# Patient Record
Sex: Male | Born: 1955
Health system: Southern US, Community
[De-identification: ages and names within clinical notes are randomized; demographics above are authoritative.]

## PROBLEM LIST (undated history)

## (undated) DIAGNOSIS — F17200 Nicotine dependence, unspecified, uncomplicated: Secondary | ICD-10-CM

## (undated) DIAGNOSIS — E785 Hyperlipidemia, unspecified: Secondary | ICD-10-CM

## (undated) DIAGNOSIS — IMO0001 Reserved for inherently not codable concepts without codable children: Secondary | ICD-10-CM

## (undated) DIAGNOSIS — Z720 Tobacco use: Secondary | ICD-10-CM

## (undated) DIAGNOSIS — I739 Peripheral vascular disease, unspecified: Secondary | ICD-10-CM

## (undated) DIAGNOSIS — I10 Essential (primary) hypertension: Secondary | ICD-10-CM

## (undated) DIAGNOSIS — K219 Gastro-esophageal reflux disease without esophagitis: Secondary | ICD-10-CM

## (undated) DIAGNOSIS — I779 Disorder of arteries and arterioles, unspecified: Secondary | ICD-10-CM

## (undated) DIAGNOSIS — M1990 Unspecified osteoarthritis, unspecified site: Secondary | ICD-10-CM

## (undated) DIAGNOSIS — Z95810 Presence of automatic (implantable) cardiac defibrillator: Secondary | ICD-10-CM

## (undated) DIAGNOSIS — I5020 Unspecified systolic (congestive) heart failure: Secondary | ICD-10-CM

## (undated) DIAGNOSIS — I219 Acute myocardial infarction, unspecified: Secondary | ICD-10-CM

## (undated) DIAGNOSIS — R0600 Dyspnea, unspecified: Secondary | ICD-10-CM

## (undated) DIAGNOSIS — I469 Cardiac arrest, cause unspecified: Secondary | ICD-10-CM

## (undated) HISTORY — PX: COLON SURGERY: SHX602

## (undated) HISTORY — DX: Tobacco use: Z72.0

## (undated) HISTORY — PX: EXPLORATORY LAPAROTOMY: SUR591

## (undated) HISTORY — PX: ABDOMINAL SURGERY: SHX537

## (undated) HISTORY — DX: Peripheral vascular disease, unspecified: I73.9

## (undated) HISTORY — DX: Essential (primary) hypertension: I10

## (undated) HISTORY — PX: TONSILLECTOMY: SUR1361

## (undated) HISTORY — PX: APPENDECTOMY: SHX54

---
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
## 2009-11-07 ENCOUNTER — Encounter: Admission: RE | Admit: 2009-11-07 | Discharge: 2009-11-07 | Payer: Self-pay | Admitting: Occupational Medicine

## 2015-07-18 ENCOUNTER — Telehealth: Payer: Self-pay | Admitting: Cardiovascular Disease

## 2015-07-18 NOTE — Telephone Encounter (Deleted)
Received records from [facility] [place] for appointment with Dr Allyson Sabal on 11/

## 2015-07-18 NOTE — Telephone Encounter (Signed)
Received records from [facility] [place] for appointment on 08/02/15 with Dr Allyson Sabal.  Records given to [facility] (medical records) for Dr Hazle Coca schedule on 08/02/15. lp

## 2015-07-30 ENCOUNTER — Encounter: Payer: Self-pay | Admitting: Cardiovascular Disease

## 2015-07-30 ENCOUNTER — Ambulatory Visit (INDEPENDENT_AMBULATORY_CARE_PROVIDER_SITE_OTHER): Payer: Managed Care, Other (non HMO) | Admitting: Cardiovascular Disease

## 2015-07-30 VITALS — BP 130/80 | HR 73 | Ht 71.0 in | Wt 184.0 lb

## 2015-07-30 DIAGNOSIS — I10 Essential (primary) hypertension: Secondary | ICD-10-CM | POA: Insufficient documentation

## 2015-07-30 DIAGNOSIS — I739 Peripheral vascular disease, unspecified: Secondary | ICD-10-CM | POA: Diagnosis not present

## 2015-07-30 DIAGNOSIS — Z720 Tobacco use: Secondary | ICD-10-CM | POA: Diagnosis not present

## 2015-07-30 NOTE — Assessment & Plan Note (Signed)
40 pack years of tobacco abuse currently smoking one pack per day

## 2015-07-30 NOTE — Assessment & Plan Note (Signed)
History of hypertension blood pressure measured at 130/80. He is on benazepril. Continue current meds at current dose

## 2015-07-30 NOTE — Assessment & Plan Note (Signed)
Rick Logan was referred by Dr. Doristine Counter for symptomatic left calf claudication which began 3 months ago. His risk factors include tobacco abuse and hypertension. He has absent left pedal pulse with a 2+ left common femoral pulse. I suspect he has an occluded left SFA. We will proceed with lower extremity arterial Doppler studies and subsequent angiography depending on the results. I thoroughly discussed the procedure with the patient and he agrees to proceed

## 2015-07-30 NOTE — Progress Notes (Signed)
07/30/2015 Rick Logan   Aug 30, 1956  [id number]  Primary Physician Delorse Lek, MD Primary Cardiologist: Runell Gess MD [name]   HPI:  Rick Logan is a 59 year old married Caucasian male father of 3 children, grandfather and 5 grandchildren referred by Dr. Doristine Counter for peripheral vascular evaluation because of symptomatic claudication. 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 His cardiovascular risk factor profile is notable for 40-pack-years of tobacco abuse currently smoking one pack per day. There is no family history of heart disease. He has never had a heart attack or stroke. He denies chest pain or shortness of breath. He's noticed claudication for the last 3 months in his left calf particularly notable when walking up an incline. He was noted by his primary care physician to have an absent left pedal pulse and was referred here for further evaluation.   Current Outpatient Prescriptions  Medication Sig Dispense Refill  . BENAZEPRIL HCL PO Take by mouth daily.     No current facility-administered medications for this visit.    No Known Allergies  Social History   Social History  . Marital Status: Unknown    Spouse Name: N/A  . Number of Children: N/A  . Years of Education: N/A   Occupational History  . Not on file.   Social History Main Topics  . Smoking status: Current Every Day Smoker  . Smokeless tobacco: Never Used  . Alcohol Use: Yes  . Drug Use: No  . Sexual Activity: Not on file   Other Topics Concern  . Not on file   Social History Narrative  . No narrative on file     Review of Systems: General: negative for chills, fever, night sweats or weight changes.  Cardiovascular: negative for chest pain, dyspnea on exertion, edema, orthopnea, palpitations, paroxysmal nocturnal dyspnea or shortness of breath Dermatological: negative for rash Respiratory: negative for cough or wheezing Urologic:  negative for hematuria Abdominal: negative for nausea, vomiting, diarrhea, bright red blood per rectum, melena, or hematemesis Neurologic: negative for visual changes, syncope, or dizziness All other systems reviewed and are otherwise negative except as noted above.    Blood pressure 130/80, pulse 73, height 5\' 11"  (1.803 m), weight 184 lb (83.462 kg).  General appearance: alert and no distress Neck: no adenopathy, no carotid bruit, no JVD, supple, symmetrical, trachea midline and thyroid not enlarged, symmetric, no tenderness/mass/nodules Lungs: clear to auscultation bilaterally Heart: regular rate and rhythm, S1, S2 normal, no murmur, click, rub or gallop Extremities: absent left pedal pulse  EKG normal sinus rhythm at 73 with left anterior fascicular block and reverse R-wave progression with nonspecific ST and T-wave changes. I personally reviewed this EKG  ASSESSMENT AND PLAN:   Tobacco abuse 40 pack years of tobacco abuse currently smoking one pack per day  Essential hypertension History of hypertension blood pressure measured at 130/80. He is on benazepril. Continue current meds at current dose  Claudication [facility]) Mr. Babayev was referred by Dr. Doristine Counter for symptomatic left calf claudication which began 3 months ago. His risk factors include tobacco abuse and hypertension. He has absent left pedal pulse with a 2+ left common femoral pulse. I suspect he has an occluded left SFA. We will proceed with lower extremity arterial Doppler studies and subsequent angiography depending on the results. I thoroughly discussed the procedure with the patient and he agrees to proceed      Runell Gess MD [facility] 07/30/2015 3:48 PM

## 2015-07-30 NOTE — Patient Instructions (Signed)
Medication Instructions:  Your physician recommends that you continue on your current medications as directed. Please refer to the Current Medication list given to you today.   Labwork: none  Testing/Procedures: Your physician has requested that you have a lower extremity arterial doppler- During this test, ultrasound is used to evaluate arterial blood flow in the legs. Allow approximately one hour for this exam.    Follow-Up: Follow up with Dr. Allyson Sabal Pending results of you lower extremity arterial dopplers.  Any Other Special Instructions Will Be Listed Below (If Applicable).     If you need a refill on your cardiac medications before your next appointment, please call your pharmacy.

## 2015-08-07 ENCOUNTER — Ambulatory Visit (HOSPITAL_COMMUNITY)
Admission: RE | Admit: 2015-08-07 | Discharge: 2015-08-07 | Disposition: A | Discharge status: Home / Self Care | Payer: Managed Care, Other (non HMO) | Source: Ambulatory Visit | Attending: Cardiovascular Disease | Admitting: Cardiovascular Disease

## 2015-08-07 ENCOUNTER — Other Ambulatory Visit: Payer: Self-pay | Admitting: Cardiovascular Disease

## 2015-08-07 DIAGNOSIS — I10 Essential (primary) hypertension: Secondary | ICD-10-CM

## 2015-08-07 DIAGNOSIS — Z720 Tobacco use: Secondary | ICD-10-CM

## 2015-08-07 DIAGNOSIS — I739 Peripheral vascular disease, unspecified: Secondary | ICD-10-CM

## 2015-08-11 ENCOUNTER — Telehealth: Payer: Self-pay | Admitting: Cardiovascular Disease

## 2015-08-11 NOTE — Telephone Encounter (Signed)
Returned call to patient.Stated he was calling to get results of LE dopplers done 08/07/15.Dr.Berry advised needs appointment with him to discuss results.Appointment scheduled with Dr.Berry 08/19/15 at 9:15 am.Stated he would like sooner appointment if possible.Advised I will send message to Dr.Berry's nurse Selena Batten for a sooner appointment.

## 2015-08-11 NOTE — Telephone Encounter (Signed)
Pt would like his ultrasound results from 08-07-15 please.

## 2015-08-12 ENCOUNTER — Encounter: Payer: Self-pay | Admitting: Cardiovascular Disease

## 2015-08-12 NOTE — Telephone Encounter (Signed)
Pt made aware he has the earliest appointment scheduled on 11/22 @9 :15pm.

## 2015-08-19 ENCOUNTER — Encounter: Payer: Self-pay | Admitting: Cardiovascular Disease

## 2015-08-19 ENCOUNTER — Ambulatory Visit (INDEPENDENT_AMBULATORY_CARE_PROVIDER_SITE_OTHER): Payer: Managed Care, Other (non HMO) | Admitting: Cardiovascular Disease

## 2015-08-19 VITALS — BP 126/80 | HR 71 | Ht 71.0 in | Wt 184.0 lb

## 2015-08-19 DIAGNOSIS — Z01818 Encounter for other preprocedural examination: Secondary | ICD-10-CM | POA: Diagnosis not present

## 2015-08-19 DIAGNOSIS — I739 Peripheral vascular disease, unspecified: Secondary | ICD-10-CM

## 2015-08-19 LAB — CBC WITH DIFFERENTIAL/PLATELET
Basophils Absolute: 0 10*3/uL (ref 0.0–0.1)
Basophils Relative: 0 % (ref 0–1)
EOS ABS: 0.3 10*3/uL (ref 0.0–0.7)
Eosinophils Relative: 3 % (ref 0–5)
HCT: 53.5 % — ABNORMAL HIGH (ref 39.0–52.0)
Hemoglobin: 18.6 g/dL — ABNORMAL HIGH (ref 13.0–17.0)
LYMPHS ABS: 2.3 10*3/uL (ref 0.7–4.0)
Lymphocytes Relative: 24 % (ref 12–46)
MCH: 33.2 pg (ref 26.0–34.0)
MCHC: 34.8 g/dL (ref 30.0–36.0)
MCV: 95.4 fL (ref 78.0–100.0)
MONOS PCT: 10 % (ref 3–12)
MPV: 10.1 fL (ref 8.6–12.4)
Monocytes Absolute: 1 10*3/uL (ref 0.1–1.0)
NEUTROS PCT: 63 % (ref 43–77)
Neutro Abs: 6.1 10*3/uL (ref 1.7–7.7)
PLATELETS: 269 10*3/uL (ref 150–400)
RBC: 5.61 MIL/uL (ref 4.22–5.81)
RDW: 13.4 % (ref 11.5–15.5)
WBC: 9.7 10*3/uL (ref 4.0–10.5)

## 2015-08-19 LAB — TSH: TSH: 1.379 u[IU]/mL (ref 0.350–4.500)

## 2015-08-19 LAB — BASIC METABOLIC PANEL
BUN: 10 mg/dL (ref 7–25)
CHLORIDE: 101 mmol/L (ref 98–110)
CO2: 27 mmol/L (ref 20–31)
CREATININE: 0.85 mg/dL (ref 0.70–1.25)
Calcium: 9.5 mg/dL (ref 8.6–10.3)
GLUCOSE: 93 mg/dL (ref 65–99)
Potassium: 4.7 mmol/L (ref 3.5–5.3)
Sodium: 139 mmol/L (ref 135–146)

## 2015-08-19 LAB — APTT: aPTT: 31 seconds (ref 24–37)

## 2015-08-19 LAB — PROTIME-INR
INR: 1.04 (ref <1.50)
Prothrombin Time: 13.7 seconds (ref 11.6–15.2)

## 2015-08-19 NOTE — Patient Instructions (Signed)
Medication Instructions:  Your physician recommends that you continue on your current medications as directed. Please refer to the Current Medication list given to you today.   Labwork: Your physician recommends that you return for lab work in: TODAY The lab can be found on the FIRST FLOOR of out building in [address]   Testing/Procedures: Dr. Allyson Sabal has ordered a peripheral angiogram to be done at [facility].  This procedure is going to look at the bloodflow in your lower extremities.  If Dr. Allyson Sabal is able to open up the arteries, you will have to spend one night in the hospital.  If he is not able to open the arteries, you will be able to go home that same day.  SCHEDULE FOR 12/1  After the procedure, you will not be allowed to drive for 3 days or push, pull, or lift anything greater than 10 lbs for one week.    You will be required to have the following tests prior to the procedure:  1. Blood work-the blood work can be done no more than 7 days prior to the procedure.  It can be done at any [facility] lab.  There is one downstairs on the first floor of this building and one in the [facility] building [phone] [address])  2. Chest Xray-the chest xray order has already been placed at the [facility] Building.     *REPS: Scott  Puncture site : Right Groin      Any Other Special Instructions Will Be Listed Below (If Applicable).     If you need a refill on your cardiac medications before your next appointment, please call your pharmacy.

## 2015-08-19 NOTE — Assessment & Plan Note (Signed)
Mr. Friedley returns to follow-up of his lower extremity arterial Doppler studies 08/07/15. His right ABI was normal and his left ABI was 0.57 with a high-frequency signal at the origin of his left SFA. He does have lifestyle limiting claudication. We will plan to perform angiography and endovascular therapy next week.

## 2015-08-19 NOTE — Progress Notes (Signed)
Mr. Tillerson returns for follow-up of his Doppler studies. This revealed a right ABI that was normal and a left ABI that was significantly diminished at 0.57. He had a hypertensive emergency signal at the origin of his left SFA. We'll plan on performing angiography and potential for continued intervention next week. I thoroughly discussed the pre-procedure with the patient.

## 2015-08-20 ENCOUNTER — Telehealth: Payer: Self-pay | Admitting: Cardiovascular Disease

## 2015-08-20 NOTE — Telephone Encounter (Signed)
Received Attending Physician Statement from [employer] via fax for Dr Allyson Sabal to complete and sign.  Sent forms to [employer] @ [facility] @ [facility] for letter/pkt to be sent to patient for AUTH/PMT.  Sent to [employer] via Courier on 08/20/15. lp

## 2015-08-25 ENCOUNTER — Ambulatory Visit
Admission: RE | Admit: 2015-08-25 | Discharge: 2015-08-25 | Disposition: A | Discharge status: Home / Self Care | Payer: Managed Care, Other (non HMO) | Source: Ambulatory Visit | Attending: Cardiovascular Disease | Admitting: Cardiovascular Disease

## 2015-08-25 DIAGNOSIS — I739 Peripheral vascular disease, unspecified: Secondary | ICD-10-CM

## 2015-08-25 DIAGNOSIS — Z01818 Encounter for other preprocedural examination: Secondary | ICD-10-CM

## 2015-08-26 ENCOUNTER — Ambulatory Visit: Payer: Managed Care, Other (non HMO) | Admitting: Cardiovascular Disease

## 2015-08-28 ENCOUNTER — Ambulatory Visit (HOSPITAL_COMMUNITY)
Admission: RE | Admit: 2015-08-28 | Discharge: 2015-08-29 | Disposition: A | Discharge status: Home / Self Care | Payer: Managed Care, Other (non HMO) | Source: Ambulatory Visit | Attending: Cardiovascular Disease | Admitting: Cardiovascular Disease

## 2015-08-28 ENCOUNTER — Encounter (HOSPITAL_COMMUNITY): Payer: Self-pay | Admitting: Cardiovascular Disease

## 2015-08-28 ENCOUNTER — Encounter (HOSPITAL_COMMUNITY)
Admission: RE | Disposition: A | Discharge status: Home / Self Care | Payer: Self-pay | Source: Ambulatory Visit | Attending: Cardiovascular Disease

## 2015-08-28 DIAGNOSIS — Z7982 Long term (current) use of aspirin: Secondary | ICD-10-CM | POA: Diagnosis not present

## 2015-08-28 DIAGNOSIS — F17210 Nicotine dependence, cigarettes, uncomplicated: Secondary | ICD-10-CM | POA: Insufficient documentation

## 2015-08-28 DIAGNOSIS — I70212 Atherosclerosis of native arteries of extremities with intermittent claudication, left leg: Secondary | ICD-10-CM | POA: Insufficient documentation

## 2015-08-28 DIAGNOSIS — Z7902 Long term (current) use of antithrombotics/antiplatelets: Secondary | ICD-10-CM | POA: Insufficient documentation

## 2015-08-28 DIAGNOSIS — I739 Peripheral vascular disease, unspecified: Secondary | ICD-10-CM | POA: Diagnosis present

## 2015-08-28 DIAGNOSIS — Z79899 Other long term (current) drug therapy: Secondary | ICD-10-CM | POA: Insufficient documentation

## 2015-08-28 HISTORY — DX: Unspecified osteoarthritis, unspecified site: M19.90

## 2015-08-28 HISTORY — DX: Peripheral vascular disease, unspecified: I73.9

## 2015-08-28 HISTORY — PX: PERIPHERAL VASCULAR CATHETERIZATION: SHX172C

## 2015-08-28 LAB — POCT ACTIVATED CLOTTING TIME
ACTIVATED CLOTTING TIME: 227 s
ACTIVATED CLOTTING TIME: 233 s
ACTIVATED CLOTTING TIME: 140 s

## 2015-08-28 SURGERY — LOWER EXTREMITY ANGIOGRAPHY
Anesthesia: LOCAL

## 2015-08-28 MED ORDER — SODIUM CHLORIDE 0.9 % WEIGHT BASED INFUSION
3.0000 mL/kg/h | INTRAVENOUS | Status: DC
  Administered 2015-08-28: 3 mL/kg/h via INTRAVENOUS

## 2015-08-28 MED ORDER — ASPIRIN EC 81 MG PO TBEC
81.0000 mg | DELAYED_RELEASE_TABLET | Freq: Every day | ORAL | Status: DC
Start: 2015-08-28 — End: 2015-08-28

## 2015-08-28 MED ORDER — MIDAZOLAM HCL 2 MG/2ML IJ SOLN
INTRAMUSCULAR | Status: AC
  Filled 2015-08-28: qty 2

## 2015-08-28 MED ORDER — SODIUM CHLORIDE 0.9 % IJ SOLN: 3.0000 mL | INTRAMUSCULAR | Status: DC | PRN

## 2015-08-28 MED ORDER — FENTANYL CITRATE (PF) 100 MCG/2ML IJ SOLN
INTRAMUSCULAR | Status: AC
  Filled 2015-08-28: qty 2

## 2015-08-28 MED ORDER — SODIUM CHLORIDE 0.9 % IV SOLN: INTRAVENOUS | Status: AC

## 2015-08-28 MED ORDER — SODIUM CHLORIDE 0.9 % WEIGHT BASED INFUSION: 1.0000 mL/kg/h | INTRAVENOUS | Status: DC

## 2015-08-28 MED ORDER — BENAZEPRIL HCL 20 MG PO TABS
40.0000 mg | ORAL_TABLET | Freq: Every day | ORAL | Status: DC
  Administered 2015-08-29: 11:00:00 40 mg via ORAL
  Filled 2015-08-28: qty 2

## 2015-08-28 MED ORDER — HEPARIN SODIUM (PORCINE) 1000 UNIT/ML IJ SOLN
INTRAMUSCULAR | Status: DC | PRN
  Administered 2015-08-28: 7500 [IU] via INTRAVENOUS
  Administered 2015-08-28: 2500 [IU] via INTRAVENOUS

## 2015-08-28 MED ORDER — HEPARIN SODIUM (PORCINE) 1000 UNIT/ML IJ SOLN
INTRAMUSCULAR | Status: AC
  Filled 2015-08-28: qty 1

## 2015-08-28 MED ORDER — ASPIRIN 81 MG PO CHEW
81.0000 mg | CHEWABLE_TABLET | ORAL | Status: DC
Start: 2015-08-28 — End: 2015-08-28

## 2015-08-28 MED ORDER — SODIUM CHLORIDE 0.9 % IV SOLN: 250.0000 mL | INTRAVENOUS | Status: DC | PRN

## 2015-08-28 MED ORDER — CLOPIDOGREL BISULFATE 75 MG PO TABS
75.0000 mg | ORAL_TABLET | Freq: Every day | ORAL | Status: DC
  Administered 2015-08-29: 08:00:00 75 mg via ORAL
  Filled 2015-08-28: qty 1

## 2015-08-28 MED ORDER — ASPIRIN EC 325 MG PO TBEC
325.0000 mg | DELAYED_RELEASE_TABLET | Freq: Every day | ORAL | Status: DC
  Administered 2015-08-29: 325 mg via ORAL
  Filled 2015-08-28: qty 1

## 2015-08-28 MED ORDER — MORPHINE SULFATE (PF) 2 MG/ML IV SOLN
2.0000 mg | INTRAVENOUS | Status: DC | PRN
Start: 2015-08-28 — End: 2015-08-29

## 2015-08-28 MED ORDER — ONDANSETRON HCL 4 MG/2ML IJ SOLN: 4.0000 mg | Freq: Four times a day (QID) | INTRAMUSCULAR | Status: DC | PRN

## 2015-08-28 MED ORDER — MIDAZOLAM HCL 2 MG/2ML IJ SOLN
INTRAMUSCULAR | Status: DC | PRN
  Administered 2015-08-28: 1 mg via INTRAVENOUS

## 2015-08-28 MED ORDER — LIDOCAINE HCL (PF) 1 % IJ SOLN
INTRAMUSCULAR | Status: AC
  Filled 2015-08-28: qty 30

## 2015-08-28 MED ORDER — ACETAMINOPHEN 325 MG PO TABS: 650.0000 mg | ORAL_TABLET | ORAL | Status: DC | PRN

## 2015-08-28 MED ORDER — ASPIRIN 81 MG PO CHEW
CHEWABLE_TABLET | ORAL | Status: AC
  Filled 2015-08-28: qty 1

## 2015-08-28 MED ORDER — SODIUM CHLORIDE 0.9 % IJ SOLN: 3.0000 mL | Freq: Two times a day (BID) | INTRAMUSCULAR | Status: DC

## 2015-08-28 MED ORDER — HEPARIN (PORCINE) IN NACL 2-0.9 UNIT/ML-% IJ SOLN
INTRAMUSCULAR | Status: AC
  Filled 2015-08-28: qty 1000

## 2015-08-28 MED ORDER — HYDRALAZINE HCL 20 MG/ML IJ SOLN: 10.0000 mg | INTRAMUSCULAR | Status: DC | PRN

## 2015-08-28 MED ORDER — FENTANYL CITRATE (PF) 100 MCG/2ML IJ SOLN
INTRAMUSCULAR | Status: DC | PRN
  Administered 2015-08-28: 25 ug via INTRAVENOUS

## 2015-08-28 MED ORDER — LIDOCAINE HCL (PF) 1 % IJ SOLN
INTRAMUSCULAR | Status: DC | PRN
  Administered 2015-08-28: 20 mL

## 2015-08-28 SURGICAL SUPPLY — 22 items
BALLN LUTONIX 6X150X130 (BALLOONS) ×2
BALLOON LUTONIX 6X150X130 (BALLOONS) IMPLANT
CATH ANGIO 5F BER2 100CM (CATHETERS) ×1 IMPLANT
CATH ANGIO 5F PIGTAIL 65CM (CATHETERS) ×1 IMPLANT
CATH HAWKONE LX EXTENDED TIP (CATHETERS) ×1 IMPLANT
CATH STRAIGHT 5FR 65CM (CATHETERS) ×1 IMPLANT
DEVICE CONTINUOUS FLUSH (MISCELLANEOUS) ×1 IMPLANT
DEVICE SPIDERFX EMB PROT 6MM (WIRE) ×1 IMPLANT
KIT ENCORE 26 ADVANTAGE (KITS) ×1 IMPLANT
KIT PV (KITS) ×2 IMPLANT
SHEATH HIGHFLEX ANSEL 7FR 55CM (SHEATH) ×1 IMPLANT
SHEATH PINNACLE 5F 10CM (SHEATH) ×1 IMPLANT
SHEATH PINNACLE 7F 10CM (SHEATH) ×1 IMPLANT
STOPCOCK MORSE 400PSI 3WAY (MISCELLANEOUS) ×1 IMPLANT
SYRINGE MEDRAD AVANTA MACH 7 (SYRINGE) ×1 IMPLANT
TAPE RADIOPAQUE TURBO (MISCELLANEOUS) ×1 IMPLANT
TRANSDUCER W/STOPCOCK (MISCELLANEOUS) ×2 IMPLANT
TRAY PV CATH (CUSTOM PROCEDURE TRAY) ×2 IMPLANT
TUBING CIL FLEX 10 FLL-RA (TUBING) ×1 IMPLANT
WIRE HITORQ VERSACORE ST 145CM (WIRE) ×1 IMPLANT
WIRE SPARTACORE .014X300CM (WIRE) ×1 IMPLANT
WIRE VERSACORE LOC 115CM (WIRE) ×1 IMPLANT

## 2015-08-28 NOTE — Progress Notes (Signed)
Site area: right groin  Site Prior to Removal:  Level 0  Pressure Applied For 40 MINUTES    Minutes Beginning at 1300  Manual:   Yes.    Patient Status During Pull:  Patient remained A&O by four. Nonlabored breathing noted. Tolerated well. No s/s of distress noted or complaints voiced.  Post Pull Groin Site:  Level 0  Post Pull Instructions Given:  Yes.    Post Pull Pulses Present:  Yes.    Dressing Applied:  Yes.    Comments:  Patient tolerated procedure well. Oozing of blood still noted after first , additional pressure held. No hematoma noted. No further oozing noted. Pressure dressing applied C/D/I. Educated post sheath removal care and bedrest.

## 2015-08-29 ENCOUNTER — Other Ambulatory Visit: Payer: Self-pay | Admitting: Physician Assistant

## 2015-08-29 DIAGNOSIS — F17210 Nicotine dependence, cigarettes, uncomplicated: Secondary | ICD-10-CM | POA: Diagnosis not present

## 2015-08-29 DIAGNOSIS — I739 Peripheral vascular disease, unspecified: Secondary | ICD-10-CM

## 2015-08-29 LAB — CBC
HCT: 47.3 % (ref 39.0–52.0)
Hemoglobin: 15.8 g/dL (ref 13.0–17.0)
MCH: 32.6 pg (ref 26.0–34.0)
MCHC: 33.4 g/dL (ref 30.0–36.0)
MCV: 97.5 fL (ref 78.0–100.0)
Platelets: 210 10*3/uL (ref 150–400)
RBC: 4.85 MIL/uL (ref 4.22–5.81)
RDW: 13 % (ref 11.5–15.5)
WBC: 10.7 10*3/uL — AB (ref 4.0–10.5)

## 2015-08-29 LAB — BASIC METABOLIC PANEL
Anion gap: 6 (ref 5–15)
BUN: 8 mg/dL (ref 6–20)
CHLORIDE: 107 mmol/L (ref 101–111)
CO2: 25 mmol/L (ref 22–32)
CREATININE: 0.81 mg/dL (ref 0.61–1.24)
Calcium: 8.4 mg/dL — ABNORMAL LOW (ref 8.9–10.3)
GFR calc Af Amer: >60 mL/min (ref >60)
GFR calc non Af Amer: >60 mL/min (ref >60)
Glucose, Bld: 108 mg/dL — ABNORMAL HIGH (ref 65–99)
Potassium: 3.9 mmol/L (ref 3.5–5.1)
SODIUM: 138 mmol/L (ref 135–145)

## 2015-08-29 MED ORDER — ANGIOPLASTY BOOK
Freq: Once | Status: AC
  Administered 2015-08-29: 12:00:00
  Filled 2015-08-29: qty 1

## 2015-08-29 MED ORDER — CLOPIDOGREL BISULFATE 75 MG PO TABS: 75.0000 mg | ORAL_TABLET | Freq: Every day | ORAL | Status: DC

## 2015-08-29 NOTE — Progress Notes (Signed)
Patient Name: Rick Logan Date of Encounter: 08/29/2015   SUBJECTIVE  Feeling well. No chest pain, sob or palpitations. Mild cath site discomfort with ambulation but tolerable.   CURRENT MEDS . aspirin EC  325 mg Oral Daily  . benazepril  40 mg Oral Daily  . clopidogrel  75 mg Oral Q breakfast    OBJECTIVE  Filed Vitals:   08/28/15 1700 08/28/15 2034 08/29/15 0415 08/29/15 0729  BP: 141/61 154/91 141/66 129/64  Pulse: 58 64 58 69  Temp:  97.7 F (36.5 C) 98 F (36.7 C) 97.4 F (36.3 C)  TempSrc:  Oral Axillary Oral  Resp: Height:      Weight:   186 lb 11.2 oz (84.687 kg)   SpO2: 98% 95% 96% 96%    Intake/Output Summary (Last 24 hours) at 08/29/15 0852 Last data filed at 08/29/15 0731  Gross per 24 hour  Intake 1517.5 ml  Output    826 ml  Net  691.5 ml   Filed Weights   08/28/15 0734 08/29/15 0415  Weight: 185 lb (83.915 kg) 186 lb 11.2 oz (84.687 kg)    PHYSICAL EXAM  General: Pleasant, NAD. Neuro: Alert and oriented X 3. Moves all extremities spontaneously. Psych: Normal affect. HEENT:  Normal  Neck: Supple without bruits or JVD. Lungs:  Resp regular and unlabored, CTA. Heart: RRR no s3, s4, or murmurs. Abdomen: Soft, non-tender, non-distended, BS + x 4.  Extremities: No clubbing, cyanosis or edema. DP/PT/Radials 2+ and equal bilaterally. R going has mild bruise no hematoma or bruit.   Accessory Clinical Findings  CBC  Recent Labs  08/29/15 0545  WBC 10.7*  HGB 15.8  HCT 47.3  MCV 97.5  PLT 210   Basic Metabolic Panel  Recent Labs  08/29/15 0545  NA 138  K 3.9  CL 107  CO2 25  GLUCOSE 108*  BUN 8  CREATININE 0.81  CALCIUM 8.4*    TELE  Sinus bradycardia at rate of 50s with transient rate in 80s with intermittent PVCs  PV Angiogram/Intervention - 08/28/15 Procedures Performed: 1. Abdominal aortogram/bilateral iliac antrum/bifemoral runoff 2. Contralateral access 3.  Placement of a spider distal protection device in the above-the-knee popliteal artery 4. Hawk 1 directional atherectomy/drug-eluting balloon angioplasty left SFA  PROCEDURE DESCRIPTION:   The patient was brought to the second floor [facility] lab in the the postabsorptive state. He was premedicated with Valium 5 mg by mouth, IV Versed and fentanyl. His right groin was prepped and shaved in usual sterile fashion. Xylocaine 1% was used for local anesthesia. A 5 French sheath was inserted into the right common femoral artery using standard Seldinger technique. A 5 French pigtail catheter was placed in the distal abdominal aorta. Distal abdominal aortography, bilateral iliac angiography and bifemoral runoff was performed using bolus chase digital subtraction step table technique. Visipaque dye was used for the entirety of the case. Retrograde aortic pressure was monitored during the case.    Angiographic Data:   1: Abdominal aortogram-the distal abdominal aorta was free of significant disease 2: Left lower extremity-there was 40-50% segmental proximal left SFA stenosis without a pullback gradient. There was 70-80% segmental proximal to mid left SFA with a 95% focal mid left SFA stenosis at the takeoff of a collateral branch. There was three-vessel runoff with the anterior tibial being the dominant vessel 3: Right lower extremity-mild diffuse 20% scattered lesions in the right SFA with 3 vessel runoff  IMPRESSION:Mr.  [name] has a moderate ostial/proximal not hemodynamically significant left SFA stenosis with high-grade proximal and mid SFA disease. We will proceed with [facility] 1 directional atherectomy using spider distal protection followed by lutonix drug eluting balloon angioplasty.  Final Impression: successful Hawk 1 directional arthrectomy, lutonix drug eluting balloon angioplasty using spider distal protection of high-grade proximal and mid left SFA stenosis for  lifestyle limiting claudication. The sheath will be removed once he is to follow-up below 170 and pressure held. The patient will be hydrated overnight and discharged home in the morning on dual antiplatelet therapy. He will get lower extremity arterial Doppler studies in [place] office next week and I will see him back in 2-3 weeks for follow-up. He left the lab in stable condition.  Radiology/Studies  Dg Chest 2 View  08/25/2015  CLINICAL DATA:  Preprocedure 08/28/2015.  Has DVT. EXAM: CHEST  2 VIEW COMPARISON:  11/07/2009 FINDINGS: Lungs are adequately inflated without consolidation or effusion. Cardiomediastinal silhouette is within normal. There are mild degenerative changes of the spine. IMPRESSION: No active cardiopulmonary disease. Electronically Signed   By: Elberta Fortis M.D.   On: 08/25/2015 13:12    ASSESSMENT AND PLAN  1. Claudication [facility])  - s/p successful Hawk 1 directional arthrectomy, lutonix drug eluting balloon angioplasty using spider distal protection of high-grade proximal and mid left SFA stenosis for lifestyle limiting claudication. - Continue DAPT with ASA and Plavix.   2. Tobacco smoker - Advised cessation. Education given.   Lorelei Pont PA-C Pager [phone]

## 2015-08-29 NOTE — Discharge Summary (Signed)
Discharge Summary   Patient ID: Rick Logan,  MRN: [id number], DOB/AGE: 1956/06/16 59 y.o.  Admit date: 08/28/2015 Discharge date: 08/29/2015  Primary Care Provider: BURNETT,BRENT A Primary Cardiologist: Dr. Allyson Sabal  Discharge Diagnoses Active Problems:   Claudication [facility])   PAD   Tobacco abuse   Allergies No Known Allergies  Consultant: None  Procedures  PV Angiogram/Intervention - 08/28/15 Procedures Performed: 1. Abdominal aortogram/bilateral iliac antrum/bifemoral runoff 2. Contralateral access 3. Placement of a spider distal protection device in the above-the-knee popliteal artery 4. Hawk 1 directional atherectomy/drug-eluting balloon angioplasty left SFA  PROCEDURE DESCRIPTION:   The patient was brought to the second floor [facility] cath lab in the the postabsorptive state. He was premedicated with Valium 5 mg by mouth, IV Versed and fentanyl. His right groin was prepped and shaved in usual sterile fashion. Xylocaine 1% was used for local anesthesia. A 5 French sheath was inserted into the right common femoral artery using standard Seldinger technique. A 5 French pigtail catheter was placed in the distal abdominal aorta. Distal abdominal aortography, bilateral iliac angiography and bifemoral runoff was performed using bolus chase digital subtraction step table technique. Visipaque dye was used for the entirety of the case. Retrograde aortic pressure was monitored during the case.   Angiographic Data:   1: Abdominal aortogram-the distal abdominal aorta was free of significant disease 2: Left lower extremity-there was 40-50% segmental proximal left SFA stenosis without a pullback gradient. There was 70-80% segmental proximal to mid left SFA with a 95% focal mid left SFA stenosis at the takeoff of a collateral branch. There was three-vessel runoff with the anterior tibial being the dominant vessel 3: Right  lower extremity-mild diffuse 20% scattered lesions in the right SFA with 3 vessel runoff  IMPRESSION:[name] has a moderate ostial/proximal not hemodynamically significant left SFA stenosis with high-grade proximal and mid SFA disease. We will proceed with [facility] 1 directional atherectomy using spider distal protection followed by lutonix drug eluting balloon angioplasty.  Final Impression: successful Hawk 1 directional arthrectomy, lutonix drug eluting balloon angioplasty using spider distal protection of high-grade proximal and mid left SFA stenosis for lifestyle limiting claudication. The sheath will be removed once he is to follow-up below 170 and pressure held. The patient will be hydrated overnight and discharged home in the morning on dual antiplatelet therapy. He will get lower extremity arterial Doppler studies in [place] office next week and I will see him back in 2-3 weeks for follow-up. He left the lab in stable condition.  History of Present Illness  [name] is a 59 year old married Caucasian male father of 3 children, grandfather and 5 grandchildren referred by Dr. Doristine Counter to Dr. Allyson Sabal for peripheral vascular evaluation because of symptomatic claudication. 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 His cardiovascular risk factor profile is notable for 40-pack-years of tobacco abuse currently smoking one pack per day. There is no family history of heart disease. He has never had a heart attack or stroke. He denies chest pain or shortness of breath. He's noticed claudication for the last 3 months in his left calf particularly notable when walking up an incline. He was noted by his primary care physician to have an absent left pedal pulse and was referred  for further evaluation.  Doppler studies revealed a right ABI that was normal and a left ABI that was significantly diminished at 0.57.  Hospital Course  The patient presented for PV angiography. This  showed 40-50%  segmental proximal left SFA stenosis without a pullback gradient. There was 70-80% segmental proximal to mid left SFA with a 95% focal mid left SFA stenosis at the takeoff of a collateral branch. There was three-vessel runoff with the anterior tibial being the dominant vessel Right lower extremity-mild diffuse 20% scattered lesions in the right SFA with 3 vessel runoff.  s/p successful Hawk 1 directional arthrectomy, lutonix drug eluting balloon angioplasty using spider distal protection of high-grade proximal and mid left SFA stenosis for lifestyle limiting claudication. Continue DAPT with ASA and Plavix. He was advised to complete smoking cessation. Mild cath site discomfort with ambulation but tolerable. Renal function was stable post cath.   She has been seen by Dr. Mayford Knife today and deemed ready for discharge home. All follow-up appointments have been scheduled. Discharge medications are listed below.   Discharge Vitals Blood pressure 129/64, pulse 69, temperature 97.4 F (36.3 C), temperature source Oral, resp. rate 14, height  (1.803 m), weight 186 lb 11.2 oz (84.687 kg), SpO2 96 %.  Filed Weights   08/28/15 0734 08/29/15 0415  Weight: 185 lb (83.915 kg) 186 lb 11.2 oz (84.687 kg)    Labs  CBC  Recent Labs  08/29/15 0545  WBC 10.7*  HGB 15.8  HCT 47.3  MCV 97.5  PLT 210   Basic Metabolic Panel  Recent Labs  08/29/15 0545  NA 138  K 3.9  CL 107  CO2 25  GLUCOSE 108*  BUN 8  CREATININE 0.81  CALCIUM 8.4*    Disposition  Pt is being discharged home today in good condition.  Follow-up Plans & Appointments  Follow-up Information    Follow up with [facility]. Go on 09/05/2015.   Specialty:  Cardiology   Why:  :30pm for LE arterial doppler   Contact information:   [address] [place] [address] [phone]      Follow up with Nanetta Batty, MD On 09/12/2015.   Specialties:  Cardiology,  Radiology   Why:  :15am for post hospital    Contact information:   [address] [place] [address] [phone]          Discharge Instructions    Diet - low sodium heart healthy    Complete by:  As directed      Discharge instructions    Complete by:  As directed   No driving for 48 hours. No lifting over 5 lbs for 1 week. No sexual activity for 1 week. You may return to work on 09/03/15. Keep procedure site clean & dry. If you notice increased pain, swelling, bleeding or pus, call/return!  You may shower, but no soaking baths/hot tubs/pools for 1 week.     Increase activity slowly    Complete by:  As directed          F/u Labs/Studies: LE arterial doppler  Discharge Medications    Medication List    TAKE these medications        aspirin EC 81 MG tablet  Take 81 mg by mouth daily.     benazepril 40 MG tablet  Commonly known as:  LOTENSIN  Take 40 mg by mouth daily.     clopidogrel 75 MG tablet  Commonly known as:  PLAVIX  Take 1 tablet (75 mg total) by mouth daily with breakfast.        Duration of Discharge Encounter   Greater than 30 minutes including physician time.  Signed, Eugine Bubb PA-C  08/29/2015, 10:40 AM

## 2015-09-01 ENCOUNTER — Telehealth: Payer: Self-pay | Admitting: Cardiovascular Disease

## 2015-09-01 NOTE — Telephone Encounter (Signed)
Yes

## 2015-09-01 NOTE — Telephone Encounter (Signed)
Pt had a procedure on 08-28-15. Please call,he have some questions.

## 2015-09-01 NOTE — Telephone Encounter (Signed)
Pt had recent procedure on 1st:  PV Angiogram/Intervention - 08/28/15 Procedures Performed: 1. Abdominal aortogram/bilateral iliac antrum/bifemoral runoff 2. Contralateral access 3. Placement of a spider distal protection device in the above-the-knee popliteal artery 4. Hawk 1 directional atherectomy/drug-eluting balloon angioplasty left SFA  ------------------------- I returned call. His employer has sent short term leave paperwork to [place].  Pt needed to know recommended return to work date. He has doppler on 9th and would want to wait to return to work until after this result is read.  He has f/u appt w/ Dr. Allyson Sabal on 16th - wants to know if he needs to stay out of work until then - states if everything comes back OK on doppler he'd want to return to work soon after.  Pt informed of typical turnaround time of doppler results. Informed him i would alert Dr. Hazle Coca nurse that paperwork en route. No further questions at this time.

## 2015-09-02 NOTE — Telephone Encounter (Signed)
Pt called in requesting that a new work note be faxed to his job until after he has his f/u with Dr. Allyson Sabal on 12/16. The fa number is [phone]. Please f/u with pt   Thanks

## 2015-09-03 NOTE — Telephone Encounter (Addendum)
Spoke with pt he feels great and would like to return work (pt is a Merchandiser, retail and dose minimal lifting).  Pt has f/u [facility] scheduled 12/9 and would like to see Dr. Allyson Sabal that day so he can see his company Dr. Next week and return to work.  Told him I would make sure that is okay with Dr. Allyson Sabal and will see if can fit him in with appt if so. Pt verbalized understanding no questions at this time.  Will write letter for pt to be out of work until he is cleared by MD and fax to his office to cover time off until cleared.

## 2015-09-04 ENCOUNTER — Encounter (HOSPITAL_COMMUNITY): Payer: Self-pay | Admitting: Cardiovascular Disease

## 2015-09-04 NOTE — Telephone Encounter (Signed)
Pt is returning the nurses call from earlier today .  Thanks

## 2015-09-05 ENCOUNTER — Ambulatory Visit (INDEPENDENT_AMBULATORY_CARE_PROVIDER_SITE_OTHER): Payer: Managed Care, Other (non HMO) | Admitting: Cardiovascular Disease

## 2015-09-05 ENCOUNTER — Encounter: Payer: Self-pay | Admitting: Cardiovascular Disease

## 2015-09-05 ENCOUNTER — Ambulatory Visit (HOSPITAL_COMMUNITY)
Admission: RE | Admit: 2015-09-05 | Discharge: 2015-09-05 | Disposition: A | Discharge status: Home / Self Care | Payer: Managed Care, Other (non HMO) | Source: Ambulatory Visit | Attending: Cardiology | Admitting: Cardiology

## 2015-09-05 VITALS — BP 138/86 | HR 62 | Ht 71.0 in | Wt 185.0 lb

## 2015-09-05 DIAGNOSIS — I739 Peripheral vascular disease, unspecified: Secondary | ICD-10-CM | POA: Insufficient documentation

## 2015-09-05 DIAGNOSIS — Z720 Tobacco use: Secondary | ICD-10-CM

## 2015-09-05 DIAGNOSIS — E785 Hyperlipidemia, unspecified: Secondary | ICD-10-CM

## 2015-09-05 DIAGNOSIS — F17200 Nicotine dependence, unspecified, uncomplicated: Secondary | ICD-10-CM | POA: Insufficient documentation

## 2015-09-05 DIAGNOSIS — I10 Essential (primary) hypertension: Secondary | ICD-10-CM | POA: Insufficient documentation

## 2015-09-05 NOTE — Assessment & Plan Note (Signed)
History of hypertension blood pressure measured at 138/86. He is on but as appropriate. Continue current meds at current dosing

## 2015-09-05 NOTE — Assessment & Plan Note (Signed)
Continued tobacco abuse down from one pack a day to one quarter pack a day with intent to stop

## 2015-09-05 NOTE — Patient Instructions (Signed)
Medication Instructions:  Your physician recommends that you continue on your current medications as directed. Please refer to the Current Medication list given to you today.   Labwork: Your physician recommends that you return for lab work in: FASTING (lipid/liver) The lab can be found on the FIRST FLOOR of out building in [address]   Testing/Procedures: Your physician has requested that you have a lower extremity arterial doppler- During this test, ultrasound is used to evaluate arterial blood flow in the legs. Allow approximately one hour for this exam. - May 2017   Follow-Up: Your physician wants you to follow-up in: 6 months with Dr. Allyson Sabal - after LEA's. You will receive a reminder letter in the mail two months in advance. If you don't receive a letter, please call our office to schedule the follow-up appointment.   Any Other Special Instructions Will Be Listed Below (If Applicable).     If you need a refill on your cardiac medications before your next appointment, please call your pharmacy.

## 2015-09-05 NOTE — Assessment & Plan Note (Signed)
History of claudication with angiographically  documented left SFA stenosis 08/28/15 status post directional atherectomy and drug-eluting balloon angioplasty of the proximal and mid left SFA with 3 vessel runoff. Follow-up Dopplers performed today revealed a left ABI of 1 widely patent SFA. His claudication has resolved. He is on dual antiplatelet therapy.

## 2015-09-05 NOTE — Telephone Encounter (Signed)
Reschedueled f/u appt for today 12/9 at 4pm after LEA dopplers.  Pt aware of updated appt so he can return to work.

## 2015-09-05 NOTE — Progress Notes (Signed)
09/05/2015 Rick Logan   Aug 22, 1956  [id number]  Primary Physician Delorse Lek, MD Primary Cardiologist: Runell Gess MD [name]   HPI:  [name] is a 59 year old married Caucasian male father of 3 children, grandfather and 5 grandchildren referred by Dr. Doristine Counter for peripheral vascular evaluation because of symptomatic claudication. 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 His cardiovascular risk factor profile is notable for 40-pack-years of tobacco abuse currently smoking one pack per day. There is no family history of heart disease. He has never had a heart attack or stroke. He denies chest pain or shortness of breath. He's noticed claudication for the last 3 months in his left calf particularly notable when walking up an incline. He was noted by his primary care physician to have an absent left pedal pulse and was referred here for further evaluation. His Dopplers showed a high-frequency signal in his proximal left SFA with a decreased ABI. He underwent peripheral angiography and intervention by myself 08/28/15 with directional atherectomy of his proximal and mid left SFA. He had 3 vessel runoff on that side. His follow-up Dopplers performed today revealed a widely patent SFA with a left ABI of 1. His claudication has resolved.   Current Outpatient Prescriptions  Medication Sig Dispense Refill  . aspirin EC 81 MG tablet Take 81 mg by mouth daily.    . benazepril (LOTENSIN) 40 MG tablet Take 40 mg by mouth daily.  0  . clopidogrel (PLAVIX) 75 MG tablet Take 1 tablet (75 mg total) by mouth daily with breakfast. 30 tablet 0   No current facility-administered medications for this visit.    No Known Allergies  Social History   Social History  . Marital Status: Married    Spouse Name: N/A  . Number of Children: N/A  . Years of Education: N/A   Occupational History  . Not on file.    Social History Main Topics  . Smoking status: Current Every Day Smoker -- 1.00 packs/day for 41 years    Types: Cigarettes  . Smokeless tobacco: Never Used  . Alcohol Use: 8.4 oz/week    14 Cans of beer per week  . Drug Use: No  . Sexual Activity: Yes   Other Topics Concern  . Not on file   Social History Narrative     Review of Systems: General: negative for chills, fever, night sweats or weight changes.  Cardiovascular: negative for chest pain, dyspnea on exertion, edema, orthopnea, palpitations, paroxysmal nocturnal dyspnea or shortness of breath Dermatological: negative for rash Respiratory: negative for cough or wheezing Urologic: negative for hematuria Abdominal: negative for nausea, vomiting, diarrhea, bright red blood per rectum, melena, or hematemesis Neurologic: negative for visual changes, syncope, or dizziness All other systems reviewed and are otherwise negative except as noted above.    Blood pressure 138/86, pulse 62, height  (1.803 m), weight 185 lb (83.915 kg).  General appearance: alert and no distress Neck: no adenopathy, no carotid bruit, no JVD, supple, symmetrical, trachea midline and thyroid not enlarged, symmetric, no tenderness/mass/nodules Lungs: clear to auscultation bilaterally Heart: regular rate and rhythm, S1, S2 normal, no murmur, click, rub or gallop Extremities: extremities normal, atraumatic, no cyanosis or edema  EKG not performed today  ASSESSMENT AND PLAN:   Tobacco abuse Continued tobacco abuse down from one pack a day to one quarter pack a day with intent to stop  PVD (peripheral vascular  disease) (HCC) History of claudication with angiographically  documented left SFA stenosis 08/28/15 status post directional atherectomy and drug-eluting balloon angioplasty of the proximal and mid left SFA with 3 vessel runoff. Follow-up Dopplers performed today revealed a left ABI of 1 widely patent SFA. His claudication has resolved. He is  on dual antiplatelet therapy.  Essential hypertension History of hypertension blood pressure measured at 138/86. He is on but as appropriate. Continue current meds at current dosing      Runell Gess MD [facility] 09/05/2015 3:43 PM

## 2015-09-09 ENCOUNTER — Telehealth: Payer: Self-pay | Admitting: Cardiovascular Disease

## 2015-09-09 NOTE — Telephone Encounter (Signed)
Received Attending Physician Statement (PPG) back from [facility] for Dr Allyson Sabal to review, complete and sign.  Gave Forms to Selena Batten, RN for Dr Allyson Sabal to review, complete and sign. lp

## 2015-09-12 ENCOUNTER — Ambulatory Visit: Payer: Managed Care, Other (non HMO) | Admitting: Cardiovascular Disease

## 2015-09-24 ENCOUNTER — Other Ambulatory Visit: Payer: Self-pay | Admitting: Cardiovascular Disease

## 2015-09-24 MED ORDER — CLOPIDOGREL BISULFATE 75 MG PO TABS: 75.0000 mg | ORAL_TABLET | Freq: Every day | ORAL | Status: DC

## 2015-09-25 ENCOUNTER — Other Ambulatory Visit: Payer: Self-pay | Admitting: Physician Assistant

## 2015-10-18 ENCOUNTER — Other Ambulatory Visit: Payer: Self-pay | Admitting: Physician Assistant

## 2015-10-20 NOTE — Telephone Encounter (Signed)
Rx request sent to pharmacy.  

## 2015-12-30 ENCOUNTER — Other Ambulatory Visit: Payer: Self-pay | Admitting: *Deleted

## 2015-12-30 MED ORDER — CLOPIDOGREL BISULFATE 75 MG PO TABS: 75.0000 mg | ORAL_TABLET | Freq: Every day | ORAL | Status: DC

## 2016-04-05 ENCOUNTER — Encounter (HOSPITAL_COMMUNITY): Payer: Self-pay | Admitting: Cardiovascular Disease

## 2016-04-05 ENCOUNTER — Other Ambulatory Visit: Payer: Self-pay | Admitting: Physician Assistant

## 2016-04-05 DIAGNOSIS — R0989 Other specified symptoms and signs involving the circulatory and respiratory systems: Secondary | ICD-10-CM

## 2016-04-06 ENCOUNTER — Other Ambulatory Visit: Payer: Managed Care, Other (non HMO)

## 2016-04-13 ENCOUNTER — Ambulatory Visit
Admission: RE | Admit: 2016-04-13 | Discharge: 2016-04-13 | Disposition: A | Discharge status: Home / Self Care | Payer: Managed Care, Other (non HMO) | Source: Ambulatory Visit | Attending: Physician Assistant | Admitting: Physician Assistant

## 2016-04-13 DIAGNOSIS — R0989 Other specified symptoms and signs involving the circulatory and respiratory systems: Secondary | ICD-10-CM

## 2016-04-20 ENCOUNTER — Other Ambulatory Visit: Payer: Self-pay | Admitting: Vascular Surgery

## 2016-04-20 DIAGNOSIS — I6521 Occlusion and stenosis of right carotid artery: Secondary | ICD-10-CM

## 2016-04-21 ENCOUNTER — Other Ambulatory Visit: Payer: Self-pay | Admitting: Vascular Surgery

## 2016-04-21 DIAGNOSIS — I6521 Occlusion and stenosis of right carotid artery: Secondary | ICD-10-CM

## 2016-05-18 ENCOUNTER — Encounter: Payer: Self-pay | Admitting: Vascular Surgery

## 2016-05-19 ENCOUNTER — Other Ambulatory Visit: Payer: Self-pay | Admitting: *Deleted

## 2016-05-19 DIAGNOSIS — I6523 Occlusion and stenosis of bilateral carotid arteries: Secondary | ICD-10-CM

## 2016-05-21 ENCOUNTER — Ambulatory Visit (INDEPENDENT_AMBULATORY_CARE_PROVIDER_SITE_OTHER): Payer: Managed Care, Other (non HMO) | Admitting: Vascular Surgery

## 2016-05-21 ENCOUNTER — Other Ambulatory Visit: Payer: Self-pay | Admitting: Vascular Surgery

## 2016-05-21 ENCOUNTER — Encounter: Payer: Self-pay | Admitting: Vascular Surgery

## 2016-05-21 ENCOUNTER — Ambulatory Visit (HOSPITAL_COMMUNITY)
Admission: RE | Admit: 2016-05-21 | Discharge: 2016-05-21 | Disposition: A | Discharge status: Home / Self Care | Payer: Managed Care, Other (non HMO) | Source: Ambulatory Visit | Attending: Vascular Surgery | Admitting: Vascular Surgery

## 2016-05-21 VITALS — BP 141/75 | HR 50 | Temp 97.3°F | Resp 18 | Ht 71.0 in | Wt 182.6 lb

## 2016-05-21 DIAGNOSIS — I739 Peripheral vascular disease, unspecified: Secondary | ICD-10-CM

## 2016-05-21 DIAGNOSIS — I6523 Occlusion and stenosis of bilateral carotid arteries: Secondary | ICD-10-CM | POA: Insufficient documentation

## 2016-05-21 LAB — VAS US CAROTID
RCCADSYS: -119 cm/s
RIGHT VERTEBRAL DIAS: -13 cm/s
Right CCA prox sys: 86 cm/s

## 2016-05-21 NOTE — Progress Notes (Signed)
Patient ID: Rick Logan, male   DOB: July 12, 1956, 61 y.o.   MRN: [id number]  Reason for Consult: No chief complaint on file.   Referred by Delorse Lek, MD  Subjective:     HPI:  Rick Logan is a 60 y.o. male with a history of left Lotrimin claudication treated by Dr. Gery Pray. He is being evaluated by his PCP for left neck shoulder pain were right-sided bruit was heard and subsequently he underwent a carotid duplex which demonstrated 70% stenosis. He is without stroke TIA or amaurosis in his life. He does take Plavix. Denies chest pain and currently has no symptoms of lower extremity claudication. Continues to smoke but has considered quitting.  Past Medical History:  Diagnosis Date  . Arthritis    "joints get sore at times" (08/28/2015)  . Claudication of calf muscles (HCC)    left lower extremity  . Essential hypertension   . PVD (peripheral vascular disease) (HCC)   . Tobacco abuse    Family History  Problem Relation Age of Onset  . Cancer Father    Past Surgical History:  Procedure Laterality Date  . APPENDECTOMY    . COLON SURGERY    . EXPLORATORY LAPAROTOMY  1970's X 2   "part of small intestines; part of my bowels; appendix; 2nd OR was for adhesions"  . PERIPHERAL VASCULAR CATHETERIZATION N/A 08/28/2015   Procedure: Lower Extremity Angiography;  Surgeon: Runell Gess, MD;  Location: [facility] INVASIVE CV LAB;  Service: Cardiovascular;  Laterality: N/A;  . TONSILLECTOMY      Short Social History:  Social History  Substance Use Topics  . Smoking status: Current Every Day Smoker    Packs/day: 1.00    Years: 41.00    Types: Cigarettes  . Smokeless tobacco: Never Used  . Alcohol use 8.4 oz/week    14 Cans of beer per week    No Known Allergies  Current Outpatient Prescriptions  Medication Sig Dispense Refill  . aspirin EC 81 MG tablet Take 81 mg by mouth daily.    . benazepril (LOTENSIN) 40 MG tablet Take 40 mg by mouth daily.  0  . clopidogrel  (PLAVIX) 75 MG tablet Take 1 tablet (75 mg total) by mouth daily. 90 tablet 3   No current facility-administered medications for this visit.     REVIEW OF SYSTEMS      Objective:  Objective   There were no vitals filed for this visit. There is no height or weight on file to calculate BMI.  Physical Exam  Constitutional: He is oriented to person, place, and time. He appears well-developed.  HENT:  Head: Normocephalic.  Eyes: Pupils are equal, round, and reactive to light.  Neck:  No bruit noted  Cardiovascular: Normal rate and regular rhythm.  Exam reveals no gallop.   No murmur heard. Pulses:      Radial pulses are 1+ on the right side, and 2+ on the left side.       Femoral pulses are 2+ on the right side, and 2+ on the left side.      Popliteal pulses are 1+ on the right side, and 1+ on the left side.       Dorsalis pedis pulses are 0 on the right side, and 0 on the left side.       Posterior tibial pulses are 0 on the right side, and 0 on the left side.  Abdominal: Soft. He exhibits no mass.  Musculoskeletal: Normal range  of motion.  Neurological: He is alert and oriented to person, place, and time.    Data: Right-sided ICA proximal PSV 324 EDV 101 homogeneous plaque suggested a 60-79% right proximal ICA stenosis.     Assessment/Plan:  This is a 60 year old white male with history of claudication treated by Dr. Gery PrayBarry now asymptomatic from that standpoint found to have a right cervical bruit by his PCP with subsequent carotid duplex in our office now demonstrating 60-79% stenosis. We have discussed the need to quit smoking as well as taking a statin drug to continue antiplatelet medication. I will see him back in 6 months with repeat carotid duplex. I advised him that his 60-79% is likely on the higher end given an EDV of 101. Should he have symptoms of stroke which we discussed he should present to an 1080 as soon as possible or call 911. We will otherwise see him in 6 months  with repeat duplex.     Maeola HarmanBrandon Christopher Judyann Casasola MD [facility]

## 2016-05-25 ENCOUNTER — Encounter: Payer: Self-pay | Admitting: Diagnostic Radiology

## 2016-11-26 ENCOUNTER — Encounter: Payer: Self-pay | Admitting: Vascular Surgery

## 2016-11-26 ENCOUNTER — Ambulatory Visit (INDEPENDENT_AMBULATORY_CARE_PROVIDER_SITE_OTHER): Payer: Managed Care, Other (non HMO) | Admitting: Vascular Surgery

## 2016-11-26 ENCOUNTER — Ambulatory Visit (HOSPITAL_COMMUNITY)
Admission: RE | Admit: 2016-11-26 | Discharge: 2016-11-26 | Disposition: A | Discharge status: Home / Self Care | Payer: Managed Care, Other (non HMO) | Source: Ambulatory Visit | Attending: Vascular Surgery | Admitting: Vascular Surgery

## 2016-11-26 VITALS — BP 119/70 | HR 63 | Temp 97.6°F | Resp 20 | Ht 71.0 in | Wt 188.0 lb

## 2016-11-26 DIAGNOSIS — I6523 Occlusion and stenosis of bilateral carotid arteries: Secondary | ICD-10-CM | POA: Diagnosis present

## 2016-11-26 DIAGNOSIS — I739 Peripheral vascular disease, unspecified: Secondary | ICD-10-CM | POA: Diagnosis present

## 2016-11-26 NOTE — Progress Notes (Signed)
Patient ID: Rick Logan, male   DOB: 1956-08-07, 61 y.o.   MRN: [id number]  Reason for Consult: Re-evaluation (6 month f/u - carotid )   Referred by Delorse Lek, MD  Subjective:     HPI:  Rick Logan is a 61 y.o. male followed by Dr. Gery Pray for previous history of claudication in his left lower extremity and is status post intervention. He has a 60-79% stenosis of his right internal carotid artery that is asymptomatic. He continues to deny amaurosis TIA or stroke in the past. He remains on statin drug and Plavix although he stopped taking his aspirin. His chief complaint today is right shoulder pain that radiates to his right arm. This is not associated with activity is not tender to palpation and is reproducible. He is otherwise without issues at this time.  Past Medical History:  Diagnosis Date  . Arthritis    "joints get sore at times" (08/28/2015)  . Claudication of calf muscles (HCC)    left lower extremity  . Essential hypertension   . PVD (peripheral vascular disease) (HCC)   . Tobacco abuse    Family History  Problem Relation Age of Onset  . Cancer Father    Past Surgical History:  Procedure Laterality Date  . APPENDECTOMY    . COLON SURGERY    . EXPLORATORY LAPAROTOMY  1970's X 2   "part of small intestines; part of my bowels; appendix; 2nd OR was for adhesions"  . PERIPHERAL VASCULAR CATHETERIZATION N/A 08/28/2015   Procedure: Lower Extremity Angiography;  Surgeon: Runell Gess, MD;  Location: [facility] INVASIVE CV LAB;  Service: Cardiovascular;  Laterality: N/A;  . TONSILLECTOMY      Short Social History:  Social History  Substance Use Topics  . Smoking status: Current Every Day Smoker    Packs/day: 1.00    Years: 41.00    Types: Cigarettes  . Smokeless tobacco: Never Used  . Alcohol use 8.4 oz/week    14 Cans of beer per week    No Known Allergies  Current Outpatient Prescriptions  Medication Sig Dispense Refill  . atorvastatin  (LIPITOR) 40 MG tablet     . benazepril (LOTENSIN) 40 MG tablet Take 40 mg by mouth daily.  0  . clopidogrel (PLAVIX) 75 MG tablet Take 1 tablet (75 mg total) by mouth daily. 90 tablet 3  . aspirin EC 81 MG tablet Take 81 mg by mouth daily.    [name] gabapentin (NEURONTIN) 300 MG capsule Take 300 mg by mouth.    . meloxicam (MOBIC) 15 MG tablet Take 15 mg by mouth.     No current facility-administered medications for this visit.     Review of Systems  Constitutional:  Constitutional negative. HENT: HENT negative.  Eyes: Eyes negative.  Respiratory: Respiratory negative.  Cardiovascular: Cardiovascular negative.  GI: Gastrointestinal negative.  Musculoskeletal: Positive for joint pain.  Neurological: Neurological negative. Hematologic: Hematologic/lymphatic negative.  Psychiatric: Psychiatric negative.        Objective:  Objective   Vitals:   11/26/16 1133 11/26/16 1134  BP: 122/73 119/70  Pulse: 63   Resp: 20   Temp: 97.6 F (36.4 C)   TempSrc: Oral   SpO2: 97%   Weight: 188 lb (85.3 kg)   Height: 5\' 11"  (1.803 m)    Body mass index is 26.22 kg/m.  Physical Exam  Constitutional: He is oriented to person, place, and time. He appears well-developed.  HENT:  Head: Normocephalic.  Eyes: Pupils are  equal, round, and reactive to light.  Neck: Normal range of motion.  Cardiovascular: Normal rate.   Pulses:      Carotid pulses are 2+ on the right side, and 2+ on the left side.      Radial pulses are 2+ on the right side, and 2+ on the left side.       Femoral pulses are 2+ on the right side, and 2+ on the left side. Pulmonary/Chest: Effort normal.  Abdominal: Soft. Bowel sounds are normal.  Musculoskeletal: Normal range of motion. He exhibits no edema.  Neurological: He is alert and oriented to person, place, and time. No cranial nerve deficit.  Skin: Skin is warm and dry.  Psychiatric: He has a normal mood and affect. His behavior is normal. Judgment and thought content  normal.    Data: Right internal carotid stenosis 60-79%.     Assessment/Plan:      61 year old male here for follow-up of right internal carotid artery stenosis that is stable at 60-79% with ratio >4. He remains asymptomatic. We again discussed the signs and symptoms of stroke he gives good understanding. He will follow-up in 6 months for repeat studies we talked about if he is stable at that time he can move about a 1 year follow-up probably. We also discussed smoking cessation but he seems disinterested.   Maeola Harman MD [facility]

## 2016-12-03 NOTE — Addendum Note (Signed)
Addended by: Burton Apley A on: 12/03/2016 09:38 AM   Modules accepted: Orders

## 2017-01-31 ENCOUNTER — Other Ambulatory Visit: Payer: Self-pay | Admitting: Cardiovascular Disease

## 2017-02-03 NOTE — Telephone Encounter (Signed)
Rx request sent to pharmacy.  

## 2017-06-01 ENCOUNTER — Inpatient Hospital Stay (HOSPITAL_COMMUNITY): Payer: 59

## 2017-06-01 ENCOUNTER — Inpatient Hospital Stay (HOSPITAL_COMMUNITY)
Admission: EM | Admit: 2017-06-01 | Discharge: 2017-06-07 | DRG: 224 | Disposition: A | Discharge status: Home / Self Care | Payer: 59 | Attending: Interventional Cardiology | Admitting: Interventional Cardiology

## 2017-06-01 ENCOUNTER — Emergency Department (HOSPITAL_COMMUNITY): Payer: 59

## 2017-06-01 ENCOUNTER — Encounter (HOSPITAL_COMMUNITY): Payer: Self-pay

## 2017-06-01 DIAGNOSIS — E872 Acidosis, unspecified: Secondary | ICD-10-CM

## 2017-06-01 DIAGNOSIS — Z781 Physical restraint status: Secondary | ICD-10-CM

## 2017-06-01 DIAGNOSIS — J449 Chronic obstructive pulmonary disease, unspecified: Secondary | ICD-10-CM | POA: Diagnosis present

## 2017-06-01 DIAGNOSIS — J690 Pneumonitis due to inhalation of food and vomit: Secondary | ICD-10-CM | POA: Diagnosis present

## 2017-06-01 DIAGNOSIS — Z959 Presence of cardiac and vascular implant and graft, unspecified: Secondary | ICD-10-CM

## 2017-06-01 DIAGNOSIS — I5021 Acute systolic (congestive) heart failure: Secondary | ICD-10-CM | POA: Diagnosis present

## 2017-06-01 DIAGNOSIS — R402222 Coma scale, best verbal response, incomprehensible words, at arrival to emergency department: Secondary | ICD-10-CM | POA: Diagnosis present

## 2017-06-01 DIAGNOSIS — E785 Hyperlipidemia, unspecified: Secondary | ICD-10-CM | POA: Diagnosis present

## 2017-06-01 DIAGNOSIS — Z720 Tobacco use: Secondary | ICD-10-CM

## 2017-06-01 DIAGNOSIS — I2510 Atherosclerotic heart disease of native coronary artery without angina pectoris: Secondary | ICD-10-CM | POA: Diagnosis present

## 2017-06-01 DIAGNOSIS — I739 Peripheral vascular disease, unspecified: Secondary | ICD-10-CM | POA: Diagnosis present

## 2017-06-01 DIAGNOSIS — I110 Hypertensive heart disease with heart failure: Secondary | ICD-10-CM | POA: Diagnosis present

## 2017-06-01 DIAGNOSIS — I5030 Unspecified diastolic (congestive) heart failure: Secondary | ICD-10-CM | POA: Diagnosis not present

## 2017-06-01 DIAGNOSIS — G9341 Metabolic encephalopathy: Secondary | ICD-10-CM | POA: Diagnosis present

## 2017-06-01 DIAGNOSIS — I10 Essential (primary) hypertension: Secondary | ICD-10-CM | POA: Diagnosis not present

## 2017-06-01 DIAGNOSIS — I214 Non-ST elevation (NSTEMI) myocardial infarction: Secondary | ICD-10-CM | POA: Diagnosis present

## 2017-06-01 DIAGNOSIS — R402342 Coma scale, best motor response, flexion withdrawal, at arrival to emergency department: Secondary | ICD-10-CM | POA: Diagnosis present

## 2017-06-01 DIAGNOSIS — E876 Hypokalemia: Secondary | ICD-10-CM | POA: Diagnosis present

## 2017-06-01 DIAGNOSIS — R402122 Coma scale, eyes open, to pain, at arrival to emergency department: Secondary | ICD-10-CM | POA: Diagnosis present

## 2017-06-01 DIAGNOSIS — Y92230 Patient room in hospital as the place of occurrence of the external cause: Secondary | ICD-10-CM | POA: Diagnosis not present

## 2017-06-01 DIAGNOSIS — I469 Cardiac arrest, cause unspecified: Secondary | ICD-10-CM | POA: Diagnosis not present

## 2017-06-01 DIAGNOSIS — Z23 Encounter for immunization: Secondary | ICD-10-CM

## 2017-06-01 DIAGNOSIS — I428 Other cardiomyopathies: Secondary | ICD-10-CM | POA: Diagnosis present

## 2017-06-01 DIAGNOSIS — I4901 Ventricular fibrillation: Principal | ICD-10-CM | POA: Diagnosis present

## 2017-06-01 DIAGNOSIS — R55 Syncope and collapse: Secondary | ICD-10-CM | POA: Diagnosis not present

## 2017-06-01 DIAGNOSIS — I462 Cardiac arrest due to underlying cardiac condition: Secondary | ICD-10-CM | POA: Diagnosis present

## 2017-06-01 DIAGNOSIS — I4581 Long QT syndrome: Secondary | ICD-10-CM | POA: Diagnosis not present

## 2017-06-01 DIAGNOSIS — G931 Anoxic brain damage, not elsewhere classified: Secondary | ICD-10-CM

## 2017-06-01 DIAGNOSIS — I959 Hypotension, unspecified: Secondary | ICD-10-CM

## 2017-06-01 DIAGNOSIS — Z9911 Dependence on respirator [ventilator] status: Secondary | ICD-10-CM

## 2017-06-01 DIAGNOSIS — R739 Hyperglycemia, unspecified: Secondary | ICD-10-CM

## 2017-06-01 DIAGNOSIS — S0540XA Penetrating wound of orbit with or without foreign body, unspecified eye, initial encounter: Secondary | ICD-10-CM

## 2017-06-01 DIAGNOSIS — J9601 Acute respiratory failure with hypoxia: Secondary | ICD-10-CM | POA: Diagnosis present

## 2017-06-01 DIAGNOSIS — E784 Other hyperlipidemia: Secondary | ICD-10-CM

## 2017-06-01 DIAGNOSIS — F17210 Nicotine dependence, cigarettes, uncomplicated: Secondary | ICD-10-CM | POA: Diagnosis present

## 2017-06-01 DIAGNOSIS — N179 Acute kidney failure, unspecified: Secondary | ICD-10-CM | POA: Diagnosis present

## 2017-06-01 DIAGNOSIS — Z95828 Presence of other vascular implants and grafts: Secondary | ICD-10-CM

## 2017-06-01 DIAGNOSIS — T462X5A Adverse effect of other antidysrhythmic drugs, initial encounter: Secondary | ICD-10-CM | POA: Diagnosis not present

## 2017-06-01 HISTORY — DX: Unspecified systolic (congestive) heart failure: I50.20

## 2017-06-01 HISTORY — DX: Reserved for inherently not codable concepts without codable children: IMO0001

## 2017-06-01 HISTORY — DX: Nicotine dependence, unspecified, uncomplicated: F17.200

## 2017-06-01 HISTORY — DX: Hyperlipidemia, unspecified: E78.5

## 2017-06-01 HISTORY — DX: Essential (primary) hypertension: I10

## 2017-06-01 HISTORY — DX: Cardiac arrest, cause unspecified: I46.9

## 2017-06-01 LAB — PHOSPHORUS: Phosphorus: 3 mg/dL (ref 2.5–4.6)

## 2017-06-01 LAB — URINALYSIS, ROUTINE W REFLEX MICROSCOPIC
BILIRUBIN URINE: NEGATIVE
Bacteria, UA: NONE SEEN
Glucose, UA: 100 mg/dL — AB
Glucose, UA: NEGATIVE mg/dL
KETONES UR: NEGATIVE mg/dL
Ketones, ur: NEGATIVE mg/dL
Leukocytes, UA: NEGATIVE
Nitrite: NEGATIVE
Nitrite: NEGATIVE
PH: 5.5 (ref 5.0–8.0)
PH: 5 (ref 5.0–8.0)
Protein, ur: 100 mg/dL — AB
Protein, ur: 30 mg/dL — AB
SPECIFIC GRAVITY, URINE: 1.018 (ref 1.005–1.030)

## 2017-06-01 LAB — CBC
HCT: 48.5 % (ref 39.0–52.0)
HEMATOCRIT: 40.8 % (ref 39.0–52.0)
Hemoglobin: 15.3 g/dL (ref 13.0–17.0)
Hemoglobin: 13.8 g/dL (ref 13.0–17.0)
MCH: 31.7 pg (ref 26.0–34.0)
MCH: 31.7 pg (ref 26.0–34.0)
MCHC: 31.5 g/dL (ref 30.0–36.0)
MCHC: 33.8 g/dL (ref 30.0–36.0)
MCV: 100.6 fL — ABNORMAL HIGH (ref 78.0–100.0)
MCV: 93.6 fL (ref 78.0–100.0)
PLATELETS: 234 10*3/uL (ref 150–400)
Platelets: 255 10*3/uL (ref 150–400)
RBC: 4.82 MIL/uL (ref 4.22–5.81)
RBC: 4.36 MIL/uL (ref 4.22–5.81)
RDW: 13.2 % (ref 11.5–15.5)
RDW: 13.6 % (ref 11.5–15.5)
WBC: 7.7 10*3/uL (ref 4.0–10.5)
WBC: 16.6 10*3/uL — AB (ref 4.0–10.5)

## 2017-06-01 LAB — RAPID URINE DRUG SCREEN, HOSP PERFORMED
AMPHETAMINES: NOT DETECTED
BENZODIAZEPINES: POSITIVE — AB
Barbiturates: NOT DETECTED
Cocaine: NOT DETECTED
OPIATES: NOT DETECTED
Tetrahydrocannabinol: NOT DETECTED

## 2017-06-01 LAB — BASIC METABOLIC PANEL
ANION GAP: 11 (ref 5–15)
ANION GAP: 8 (ref 5–15)
BUN: 10 mg/dL (ref 6–20)
BUN: 13 mg/dL (ref 6–20)
CALCIUM: 7.3 mg/dL — AB (ref 8.9–10.3)
CALCIUM: 7.1 mg/dL — AB (ref 8.9–10.3)
CO2: 19 mmol/L — ABNORMAL LOW (ref 22–32)
CO2: 18 mmol/L — ABNORMAL LOW (ref 22–32)
Chloride: 110 mmol/L (ref 101–111)
Chloride: 115 mmol/L — ABNORMAL HIGH (ref 101–111)
Creatinine, Ser: 1.4 mg/dL — ABNORMAL HIGH (ref 0.61–1.24)
Creatinine, Ser: 1.11 mg/dL (ref 0.61–1.24)
GFR calc Af Amer: >60 mL/min (ref >60)
GFR calc Af Amer: >60 mL/min (ref >60)
GFR, EST NON AFRICAN AMERICAN: 52 mL/min — AB (ref >60)
GLUCOSE: 287 mg/dL — AB (ref 65–99)
GLUCOSE: 113 mg/dL — AB (ref 65–99)
POTASSIUM: 3.4 mmol/L — AB (ref 3.5–5.1)
Potassium: 3.5 mmol/L (ref 3.5–5.1)
SODIUM: 140 mmol/L (ref 135–145)
SODIUM: 141 mmol/L (ref 135–145)

## 2017-06-01 LAB — GLUCOSE, CAPILLARY
GLUCOSE-CAPILLARY: 226 mg/dL — AB (ref 65–99)
GLUCOSE-CAPILLARY: 93 mg/dL (ref 65–99)
GLUCOSE-CAPILLARY: 126 mg/dL — AB (ref 65–99)
Glucose-Capillary: 165 mg/dL — ABNORMAL HIGH (ref 65–99)

## 2017-06-01 LAB — BLOOD GAS, ARTERIAL
Acid-base deficit: 8.6 mmol/L — ABNORMAL HIGH (ref 0.0–2.0)
BICARBONATE: 17.3 mmol/L — AB (ref 20.0–28.0)
DRAWN BY: 406621
FIO2: 50
MECHVT: 640 mL
O2 Saturation: 95.6 %
PATIENT TEMPERATURE: 98.6
PCO2 ART: 41.3 mmHg (ref 32.0–48.0)
PEEP/CPAP: 5 cmH2O
PH ART: 7.246 — AB (ref 7.350–7.450)
PO2 ART: 96.9 mmHg (ref 83.0–108.0)
RATE: 24 resp/min

## 2017-06-01 LAB — COMPREHENSIVE METABOLIC PANEL
ALBUMIN: 3.3 g/dL — AB (ref 3.5–5.0)
ALT: 220 U/L — ABNORMAL HIGH (ref 17–63)
ANION GAP: 25 — AB (ref 5–15)
AST: 210 U/L — ABNORMAL HIGH (ref 15–41)
Alkaline Phosphatase: 83 U/L (ref 38–126)
BUN: 7 mg/dL (ref 6–20)
CO2: 9 mmol/L — AB (ref 22–32)
Calcium: 8.8 mg/dL — ABNORMAL LOW (ref 8.9–10.3)
Chloride: 104 mmol/L (ref 101–111)
Creatinine, Ser: 1.64 mg/dL — ABNORMAL HIGH (ref 0.61–1.24)
GFR calc non Af Amer: 43 mL/min — ABNORMAL LOW (ref >60)
GFR, EST AFRICAN AMERICAN: 50 mL/min — AB (ref >60)
GLUCOSE: 438 mg/dL — AB (ref 65–99)
POTASSIUM: 3.9 mmol/L (ref 3.5–5.1)
SODIUM: 138 mmol/L (ref 135–145)
Total Bilirubin: 0.8 mg/dL (ref 0.3–1.2)
Total Protein: 5.7 g/dL — ABNORMAL LOW (ref 6.5–8.1)

## 2017-06-01 LAB — CREATININE, SERUM
CREATININE: 1.35 mg/dL — AB (ref 0.61–1.24)
GFR calc Af Amer: >60 mL/min (ref >60)
GFR, EST NON AFRICAN AMERICAN: 54 mL/min — AB (ref >60)

## 2017-06-01 LAB — URINALYSIS, MICROSCOPIC (REFLEX)

## 2017-06-01 LAB — TYPE AND SCREEN
ABO/RH(D): O POS
ANTIBODY SCREEN: NEGATIVE

## 2017-06-01 LAB — ECHOCARDIOGRAM COMPLETE
Height: 73 in
Weight: 2821.89 oz

## 2017-06-01 LAB — I-STAT CHEM 8, ED
BUN: 11 mg/dL (ref 6–20)
CALCIUM ION: 1.08 mmol/L — AB (ref 1.15–1.40)
Chloride: 109 mmol/L (ref 101–111)
Creatinine, Ser: 1.2 mg/dL (ref 0.61–1.24)
GLUCOSE: 407 mg/dL — AB (ref 65–99)
HCT: 47 % (ref 39.0–52.0)
HEMOGLOBIN: 16 g/dL (ref 13.0–17.0)
Potassium: 4 mmol/L (ref 3.5–5.1)
Sodium: 139 mmol/L (ref 135–145)
TCO2: 11 mmol/L — AB (ref 22–32)

## 2017-06-01 LAB — TRIGLYCERIDES: TRIGLYCERIDES: 218 mg/dL — AB (ref <150)

## 2017-06-01 LAB — I-STAT ARTERIAL BLOOD GAS, ED
Acid-base deficit: 10 mmol/L — ABNORMAL HIGH (ref 0.0–2.0)
BICARBONATE: 16.8 mmol/L — AB (ref 20.0–28.0)
O2 SAT: 100 %
PCO2 ART: 36.4 mmHg (ref 32.0–48.0)
PH ART: 7.263 — AB (ref 7.350–7.450)
PO2 ART: 225 mmHg — AB (ref 83.0–108.0)
TCO2: 18 mmol/L — AB (ref 22–32)

## 2017-06-01 LAB — MRSA PCR SCREENING: MRSA by PCR: NEGATIVE

## 2017-06-01 LAB — BETA-HYDROXYBUTYRIC ACID: BETA-HYDROXYBUTYRIC ACID: 0.08 mmol/L (ref 0.05–0.27)

## 2017-06-01 LAB — I-STAT TROPONIN, ED
TROPONIN I, POC: 0.03 ng/mL (ref 0.00–0.08)
TROPONIN I, POC: 0.65 ng/mL — AB (ref 0.00–0.08)

## 2017-06-01 LAB — BRAIN NATRIURETIC PEPTIDE: B Natriuretic Peptide: 22.6 pg/mL (ref 0.0–100.0)

## 2017-06-01 LAB — TROPONIN I: Troponin I: 1.34 ng/mL (ref <0.03)

## 2017-06-01 LAB — LIPASE, BLOOD: LIPASE: 38 U/L (ref 11–51)

## 2017-06-01 LAB — PROCALCITONIN: Procalcitonin: 0.33 ng/mL

## 2017-06-01 LAB — LACTIC ACID, PLASMA: Lactic Acid, Venous: 3 mmol/L (ref 0.5–1.9)

## 2017-06-01 LAB — ETHANOL

## 2017-06-01 LAB — PROTIME-INR
INR: 1.37
Prothrombin Time: 16.8 seconds — ABNORMAL HIGH (ref 11.4–15.2)

## 2017-06-01 LAB — ABO/RH: ABO/RH(D): O POS

## 2017-06-01 MED ORDER — EPINEPHRINE PF 1 MG/ML IJ SOLN
0.5000 ug/min | INTRAVENOUS | Status: DC
  Filled 2017-06-01: qty 4

## 2017-06-01 MED ORDER — SODIUM BICARBONATE 8.4 % IV SOLN
INTRAVENOUS | Status: AC
  Filled 2017-06-01: qty 50

## 2017-06-01 MED ORDER — FENTANYL CITRATE (PF) 100 MCG/2ML IJ SOLN
INTRAMUSCULAR | Status: AC
  Filled 2017-06-01: qty 2

## 2017-06-01 MED ORDER — ALBUTEROL SULFATE (2.5 MG/3ML) 0.083% IN NEBU
5.0000 mg | INHALATION_SOLUTION | Freq: Once | RESPIRATORY_TRACT | Status: AC
  Administered 2017-06-01: 5 mg via RESPIRATORY_TRACT
  Filled 2017-06-01: qty 6

## 2017-06-01 MED ORDER — MIDAZOLAM HCL 2 MG/2ML IJ SOLN: 1.0000 mg | INTRAMUSCULAR | Status: DC | PRN

## 2017-06-01 MED ORDER — SODIUM CHLORIDE 0.9 % IV BOLUS (SEPSIS)
1000.0000 mL | Freq: Once | INTRAVENOUS | Status: AC
  Administered 2017-06-01: 1000 mL via INTRAVENOUS

## 2017-06-01 MED ORDER — AMIODARONE LOAD VIA INFUSION
150.0000 mg | Freq: Once | INTRAVENOUS | Status: AC
  Administered 2017-06-01: 150 mg via INTRAVENOUS

## 2017-06-01 MED ORDER — PROPOFOL 1000 MG/100ML IV EMUL
0.0000 ug/kg/min | INTRAVENOUS | Status: DC
  Administered 2017-06-01: 10 ug/kg/min via INTRAVENOUS

## 2017-06-01 MED ORDER — SODIUM CHLORIDE 0.9 % IV SOLN: INTRAVENOUS | Status: DC

## 2017-06-01 MED ORDER — IPRATROPIUM-ALBUTEROL 0.5-2.5 (3) MG/3ML IN SOLN
3.0000 mL | Freq: Four times a day (QID) | RESPIRATORY_TRACT | Status: DC
  Administered 2017-06-01 – 2017-06-03 (×7): 3 mL via RESPIRATORY_TRACT
  Filled 2017-06-01 (×8): qty 3

## 2017-06-01 MED ORDER — SODIUM BICARBONATE 8.4 % IV SOLN
50.0000 meq | Freq: Once | INTRAVENOUS | Status: AC
  Administered 2017-06-01: 50 meq via INTRAVENOUS

## 2017-06-01 MED ORDER — SODIUM CHLORIDE 0.9 % IV SOLN: 250.0000 mL | INTRAVENOUS | Status: DC | PRN

## 2017-06-01 MED ORDER — SODIUM CHLORIDE 0.9 % IV SOLN
INTRAVENOUS | Status: DC
  Administered 2017-06-01: 11:00:00 via INTRAVENOUS

## 2017-06-01 MED ORDER — NOREPINEPHRINE BITARTRATE 1 MG/ML IV SOLN
0.0000 ug/min | Freq: Once | INTRAVENOUS | Status: DC
  Filled 2017-06-01: qty 16

## 2017-06-01 MED ORDER — AMIODARONE HCL IN DEXTROSE 360-4.14 MG/200ML-% IV SOLN
30.0000 mg/h | INTRAVENOUS | Status: DC
  Administered 2017-06-01 – 2017-06-02 (×3): 30 mg/h via INTRAVENOUS
  Filled 2017-06-01 (×9): qty 200

## 2017-06-01 MED ORDER — DEXTROSE-NACL 5-0.45 % IV SOLN: INTRAVENOUS | Status: DC

## 2017-06-01 MED ORDER — SUCCINYLCHOLINE CHLORIDE 20 MG/ML IJ SOLN
INTRAMUSCULAR | Status: DC | PRN
  Administered 2017-06-01: 120 mg via INTRAVENOUS

## 2017-06-01 MED ORDER — FENTANYL BOLUS VIA INFUSION
50.0000 ug | INTRAVENOUS | Status: DC | PRN
  Administered 2017-06-01 – 2017-06-02 (×5): 50 ug via INTRAVENOUS
  Filled 2017-06-01: qty 50

## 2017-06-01 MED ORDER — ASPIRIN 300 MG RE SUPP: 300.0000 mg | RECTAL | Status: AC

## 2017-06-01 MED ORDER — PROPOFOL 1000 MG/100ML IV EMUL
5.0000 ug/kg/min | INTRAVENOUS | Status: DC
  Administered 2017-06-01: 10 ug/kg/min via INTRAVENOUS
  Administered 2017-06-01: 20 ug/kg/min via INTRAVENOUS
  Administered 2017-06-02: 15 ug/kg/min via INTRAVENOUS
  Filled 2017-06-01 (×2): qty 100

## 2017-06-01 MED ORDER — SODIUM CHLORIDE 0.9 % IV SOLN
INTRAVENOUS | Status: DC
  Administered 2017-06-01: 08:00:00 via INTRAVENOUS

## 2017-06-01 MED ORDER — LORAZEPAM 2 MG/ML IJ SOLN
2.0000 mg | Freq: Once | INTRAMUSCULAR | Status: DC
  Filled 2017-06-01: qty 1

## 2017-06-01 MED ORDER — HEPARIN SODIUM (PORCINE) 5000 UNIT/ML IJ SOLN
5000.0000 [IU] | Freq: Three times a day (TID) | INTRAMUSCULAR | Status: DC
  Administered 2017-06-01 – 2017-06-02 (×3): 5000 [IU] via SUBCUTANEOUS
  Filled 2017-06-01 (×4): qty 1

## 2017-06-01 MED ORDER — FENTANYL 2500MCG IN NS 250ML (10MCG/ML) PREMIX INFUSION
25.0000 ug/h | INTRAVENOUS | Status: DC
  Administered 2017-06-01: 50 ug/h via INTRAVENOUS
  Administered 2017-06-01: 100 ug/h via INTRAVENOUS
  Administered 2017-06-02: 50 ug/h via INTRAVENOUS
  Administered 2017-06-02: 150 ug/h via INTRAVENOUS
  Filled 2017-06-01 (×2): qty 250

## 2017-06-01 MED ORDER — FENTANYL CITRATE (PF) 100 MCG/2ML IJ SOLN: 50.0000 ug | Freq: Once | INTRAMUSCULAR | Status: DC

## 2017-06-01 MED ORDER — MIDAZOLAM HCL 2 MG/2ML IJ SOLN
1.0000 mg | INTRAMUSCULAR | Status: DC | PRN
  Administered 2017-06-01 (×2): 1 mg via INTRAVENOUS

## 2017-06-01 MED ORDER — LORAZEPAM 2 MG/ML IJ SOLN
2.0000 mg | Freq: Once | INTRAMUSCULAR | Status: AC
  Administered 2017-06-01: 2 mg via INTRAVENOUS
  Filled 2017-06-01: qty 1

## 2017-06-01 MED ORDER — POTASSIUM CHLORIDE 10 MEQ/100ML IV SOLN
10.0000 meq | INTRAVENOUS | Status: AC
  Administered 2017-06-01: 10 meq via INTRAVENOUS
  Filled 2017-06-01: qty 100

## 2017-06-01 MED ORDER — ATORVASTATIN CALCIUM 40 MG PO TABS
40.0000 mg | ORAL_TABLET | Freq: Every day | ORAL | Status: DC
  Filled 2017-06-01: qty 1

## 2017-06-01 MED ORDER — SODIUM CHLORIDE 0.9 % IV SOLN
0.0000 ug/kg/h | INTRAVENOUS | Status: DC
  Filled 2017-06-01: qty 2

## 2017-06-01 MED ORDER — PANTOPRAZOLE SODIUM 40 MG IV SOLR
40.0000 mg | Freq: Every day | INTRAVENOUS | Status: DC
  Administered 2017-06-01 – 2017-06-02 (×2): 40 mg via INTRAVENOUS
  Filled 2017-06-01 (×2): qty 40

## 2017-06-01 MED ORDER — IBUPROFEN 100 MG/5ML PO SUSP
400.0000 mg | Freq: Four times a day (QID) | ORAL | Status: DC | PRN
  Administered 2017-06-01: 400 mg
  Filled 2017-06-01 (×3): qty 20

## 2017-06-01 MED ORDER — PROPOFOL 1000 MG/100ML IV EMUL
INTRAVENOUS | Status: AC
  Filled 2017-06-01: qty 100

## 2017-06-01 MED ORDER — INSULIN REGULAR BOLUS VIA INFUSION
0.0000 [IU] | Freq: Three times a day (TID) | INTRAVENOUS | Status: DC
  Filled 2017-06-01: qty 10

## 2017-06-01 MED ORDER — DEXTROSE 50 % IV SOLN: 25.0000 mL | INTRAVENOUS | Status: DC | PRN

## 2017-06-01 MED ORDER — ORAL CARE MOUTH RINSE
15.0000 mL | Freq: Four times a day (QID) | OROMUCOSAL | Status: DC
  Administered 2017-06-01 – 2017-06-02 (×3): 15 mL via OROMUCOSAL

## 2017-06-01 MED ORDER — PERFLUTREN LIPID MICROSPHERE
1.0000 mL | INTRAVENOUS | Status: AC | PRN
  Filled 2017-06-01: qty 10

## 2017-06-01 MED ORDER — CHLORHEXIDINE GLUCONATE 0.12% ORAL RINSE (MEDLINE KIT)
15.0000 mL | Freq: Two times a day (BID) | OROMUCOSAL | Status: DC
  Administered 2017-06-02: 15 mL via OROMUCOSAL

## 2017-06-01 MED ORDER — PIPERACILLIN-TAZOBACTAM 3.375 G IVPB
3.3750 g | Freq: Three times a day (TID) | INTRAVENOUS | Status: DC
  Administered 2017-06-02 – 2017-06-03 (×4): 3.375 g via INTRAVENOUS
  Filled 2017-06-01 (×6): qty 50

## 2017-06-01 MED ORDER — AMIODARONE HCL IN DEXTROSE 360-4.14 MG/200ML-% IV SOLN
60.0000 mg/h | INTRAVENOUS | Status: AC
  Administered 2017-06-01: 60 mg/h via INTRAVENOUS
  Filled 2017-06-01: qty 200

## 2017-06-01 MED ORDER — FENTANYL CITRATE (PF) 100 MCG/2ML IJ SOLN
100.0000 ug | INTRAMUSCULAR | Status: AC | PRN
  Administered 2017-06-01 (×3): 100 ug via INTRAVENOUS
  Filled 2017-06-01 (×3): qty 2

## 2017-06-01 MED ORDER — SODIUM CHLORIDE 0.9 % IV SOLN: INTRAVENOUS | Status: DC | PRN

## 2017-06-01 MED ORDER — VANCOMYCIN HCL IN DEXTROSE 1-5 GM/200ML-% IV SOLN
1000.0000 mg | Freq: Two times a day (BID) | INTRAVENOUS | Status: DC
  Administered 2017-06-02 (×2): 1000 mg via INTRAVENOUS
  Filled 2017-06-01 (×3): qty 200

## 2017-06-01 MED ORDER — SODIUM CHLORIDE 0.9 % IV SOLN
INTRAVENOUS | Status: DC
  Administered 2017-06-01: 1000 mL via INTRAVENOUS
  Administered 2017-06-02: 125 mL/h via INTRAVENOUS

## 2017-06-01 MED ORDER — SODIUM BICARBONATE 8.4 % IV SOLN
50.0000 meq | Freq: Once | INTRAVENOUS | Status: AC
  Administered 2017-06-01: 50 meq via INTRAVENOUS
  Filled 2017-06-01: qty 50

## 2017-06-01 MED ORDER — FENTANYL CITRATE (PF) 100 MCG/2ML IJ SOLN
25.0000 ug | Freq: Once | INTRAMUSCULAR | Status: AC
  Administered 2017-06-01: 25 ug via INTRAVENOUS

## 2017-06-01 MED ORDER — ASPIRIN 81 MG PO CHEW
324.0000 mg | CHEWABLE_TABLET | ORAL | Status: AC
  Administered 2017-06-01: 324 mg via ORAL
  Filled 2017-06-01: qty 4

## 2017-06-01 MED ORDER — NOREPINEPHRINE 16 MG/250ML-% IV SOLN
0.0000 ug/min | INTRAVENOUS | Status: DC
  Administered 2017-06-01: 10 ug/min via INTRAVENOUS
  Administered 2017-06-02: 20 ug/min via INTRAVENOUS
  Administered 2017-06-03: 2 ug/min via INTRAVENOUS
  Filled 2017-06-01 (×3): qty 250

## 2017-06-01 MED ORDER — CLOPIDOGREL BISULFATE 75 MG PO TABS
75.0000 mg | ORAL_TABLET | Freq: Every day | ORAL | Status: DC
  Filled 2017-06-01: qty 1

## 2017-06-01 MED ORDER — FENTANYL CITRATE (PF) 100 MCG/2ML IJ SOLN: 100.0000 ug | INTRAMUSCULAR | Status: DC | PRN

## 2017-06-01 MED ORDER — DEXTROSE-NACL 5-0.45 % IV SOLN
INTRAVENOUS | Status: DC
  Administered 2017-06-01: 13:00:00 via INTRAVENOUS
  Administered 2017-06-01: 1000 mL via INTRAVENOUS

## 2017-06-01 MED ORDER — ASPIRIN 81 MG PO CHEW: 81.0000 mg | CHEWABLE_TABLET | Freq: Every day | ORAL | Status: DC

## 2017-06-01 MED ORDER — MIDAZOLAM HCL 2 MG/2ML IJ SOLN
INTRAMUSCULAR | Status: AC
  Filled 2017-06-01: qty 2

## 2017-06-01 MED ORDER — ETOMIDATE 2 MG/ML IV SOLN
INTRAVENOUS | Status: DC | PRN
  Administered 2017-06-01: 20 mg via INTRAVENOUS

## 2017-06-01 MED ORDER — INSULIN ASPART 100 UNIT/ML ~~LOC~~ SOLN
2.0000 [IU] | SUBCUTANEOUS | Status: DC
  Administered 2017-06-01: 2 [IU] via SUBCUTANEOUS
  Administered 2017-06-01: 4 [IU] via SUBCUTANEOUS
  Administered 2017-06-01: 6 [IU] via SUBCUTANEOUS
  Administered 2017-06-02: 2 [IU] via SUBCUTANEOUS

## 2017-06-01 MED ORDER — NOREPINEPHRINE BITARTRATE 1 MG/ML IV SOLN
0.0000 ug/min | Freq: Once | INTRAVENOUS | Status: AC
  Administered 2017-06-01: 2 ug/min via INTRAVENOUS
  Filled 2017-06-01: qty 4

## 2017-06-01 MED ORDER — SODIUM CHLORIDE 0.9 % IV SOLN
INTRAVENOUS | Status: DC
  Filled 2017-06-01: qty 1

## 2017-06-01 MED ORDER — AMIODARONE HCL IN DEXTROSE 360-4.14 MG/200ML-% IV SOLN
INTRAVENOUS | Status: AC
  Filled 2017-06-01: qty 200

## 2017-06-01 MED ORDER — PIPERACILLIN-TAZOBACTAM 3.375 G IVPB 30 MIN
3.3750 g | Freq: Once | INTRAVENOUS | Status: AC
  Administered 2017-06-01: 3.375 g via INTRAVENOUS
  Filled 2017-06-01: qty 50

## 2017-06-01 MED ORDER — VANCOMYCIN HCL 10 G IV SOLR
1500.0000 mg | Freq: Once | INTRAVENOUS | Status: AC
  Administered 2017-06-01: 1500 mg via INTRAVENOUS
  Filled 2017-06-01: qty 1500

## 2017-06-01 NOTE — Procedures (Signed)
Arterial Catheter Insertion Procedure Note Rick Logan [id number] Feb 04, 1956  Procedure: Insertion of Arterial Catheter  Indications: Blood pressure monitoring and Frequent blood sampling  Procedure Details Consent: Unable to obtain consent because of emergent medical necessity. Time Out: Verified patient identification, verified procedure, site/side was marked, verified correct patient position, special equipment/implants available, medications/allergies/relevent history reviewed, required imaging and test results available.  Performed  Maximum sterile technique was used including antiseptics, cap, gloves, gown, hand hygiene, mask and sheet. Skin prep: Chlorhexidine; local anesthetic administered 20 gauge catheter was inserted into left radial artery using the Seldinger technique.  Evaluation Blood flow good; BP tracing good. Complications: No apparent complications.   Rick Logan 06/01/2017

## 2017-06-01 NOTE — ED Notes (Signed)
Portable CXR to bedside but states they will have to come back to take XR.

## 2017-06-01 NOTE — ED Notes (Addendum)
Critical care at bedside. Ice packs removed per CCM.

## 2017-06-01 NOTE — Code Documentation (Signed)
7.5 ETT, 24 at the lip. Positive color changed, placed by Dr. Denton Lank

## 2017-06-01 NOTE — ED Provider Notes (Signed)
[facility]-EMERGENCY DEPT Provider Note   CSN: [id number] Arrival date & time: 06/01/17  3887     History   Chief Complaint Chief Complaint  Patient presents with  . Cardiac Arrest    HPI Rick Logan is a 61 y.o. male.  Patient with hx hypertension, presents s/p vfib arrest, cpr x 20-25 minutes, defib x 4, epi x 2, with ROC.  Per report, pt was at work, was going outside to smoke, c/o generally not feeling well, and had witnessed arrest.  Unclear from report if bystander cpr.  EMS initially placed [facility] airway - they report with ROSC that pt became combative, flailing arms/legs, ripped out tube.  EMS reports return of pulses in route.  EMS notes CBG in 200's.  EMS did give versed 2.5 mg iv prior to arrival. Pt not verbally responsive - level 5 caveat.    The history is provided by the patient and the EMS personnel. The history is limited by the condition of the patient.  Cardiac Arrest    Past Medical History:  Diagnosis Date  . Hyperlipidemia   . Hypertension     There are no active problems to display for this patient.   No past surgical history on file.     Home Medications    Prior to Admission medications   Not on File    Family History No family history on file.  Social History Social History  Substance Use Topics  . Smoking status: Not on file  . Smokeless tobacco: Not on file  . Alcohol use Not on file     Allergies   Patient has no allergy information on record.   Review of Systems Review of Systems  Unable to perform ROS: Patient unresponsive  level 5 caveat, post arrest, unresponsive   Physical Exam Updated Vital Signs BP 108/74   Pulse 93   Temp (!) 97.2 F (36.2 C) (Rectal)   Resp 14   Ht 1.854 m (6\' 1" )   Wt 80 kg (176 lb 5.9 oz)   SpO2 98%   BMI 23.27 kg/m   Physical Exam  Constitutional: He is oriented to person, place, and time. He appears well-developed and well-nourished. No distress.  HENT:  Head: Atraumatic.    Mouth/Throat: Oropharynx is clear and moist.  Eyes: Pupils are equal, round, and reactive to light. Conjunctivae are normal. No scleral icterus.  Neck: Neck supple. No tracheal deviation present. No thyromegaly present.  No bruits.   Cardiovascular: Regular rhythm, normal heart sounds and intact distal pulses.  Exam reveals no gallop and no friction rub.   No murmur heard. Tachycardic.   Pulmonary/Chest: Effort normal. No accessory muscle usage. No respiratory distress.  Abdominal: Soft. Bowel sounds are normal. He exhibits no distension. There is no tenderness.  Genitourinary:  Genitourinary Comments: Normal external gu exam.   Musculoskeletal: He exhibits no edema.  IO line left lower leg.    Neurological: He is alert and oriented to person, place, and time.  Skin: Skin is warm and dry. No rash noted. He is not diaphoretic.  Mottled appearance to skin.   Psychiatric:  Initially unresponsive, then flailing about/agitated appearing.   Nursing note and vitals reviewed.    ED Treatments / Results  Labs (all labs ordered are listed, but only abnormal results are displayed) Results for orders placed or performed during the hospital encounter of 06/01/17  CBC  Result Value Ref Range   WBC 7.7 4.0 - 10.5 K/uL   RBC 4.82 4.22 -  5.81 MIL/uL   Hemoglobin 15.3 13.0 - 17.0 g/dL   HCT 16.1 09.6 - 04.5 %   MCV 100.6 (H) 78.0 - 100.0 fL   MCH 31.7 26.0 - 34.0 pg   MCHC 31.5 30.0 - 36.0 g/dL   RDW 40.9 81.1 - 91.4 %   Platelets 234 150 - 400 K/uL  Comprehensive metabolic panel  Result Value Ref Range   Sodium 138 135 - 145 mmol/L   Potassium 3.9 3.5 - 5.1 mmol/L   Chloride 104 101 - 111 mmol/L   CO2 9 (L) 22 - 32 mmol/L   Glucose, Bld 438 (H) 65 - 99 mg/dL   BUN 7 6 - 20 mg/dL   Creatinine, Ser 7.82 (H) 0.61 - 1.24 mg/dL   Calcium 8.8 (L) 8.9 - 10.3 mg/dL   Total Protein 5.7 (L) 6.5 - 8.1 g/dL   Albumin 3.3 (L) 3.5 - 5.0 g/dL   AST 956 (H) 15 - 41 U/L   ALT 220 (H) 17 - 63 U/L    Alkaline Phosphatase 83 38 - 126 U/L   Total Bilirubin 0.8 0.3 - 1.2 mg/dL   GFR calc non Af Amer 43 (L) >60 mL/min   GFR calc Af Amer 50 (L) >60 mL/min   Anion gap 25 (H) 5 - 15  Protime-INR  Result Value Ref Range   Prothrombin Time 16.8 (H) 11.4 - 15.2 seconds   INR 1.37   I-stat troponin, ED  Result Value Ref Range   Troponin i, poc 0.03 0.00 - 0.08 ng/mL   Comment 3          I-stat chem 8, ed  Result Value Ref Range   Sodium 139 135 - 145 mmol/L   Potassium 4.0 3.5 - 5.1 mmol/L   Chloride 109 101 - 111 mmol/L   BUN 11 6 - 20 mg/dL   Creatinine, Ser 2.13 0.61 - 1.24 mg/dL   Glucose, Bld 086 (H) 65 - 99 mg/dL   Calcium, Ion 5.78 (L) 1.15 - 1.40 mmol/L   TCO2 11 (L) 22 - 32 mmol/L   Hemoglobin 16.0 13.0 - 17.0 g/dL   HCT 46.9 62.9 - 52.8 %    EKG  EKG Interpretation  Date/Time:  Wednesday June 01 2017 07:41:15 EDT Ventricular Rate:  127 PR Interval:    QRS Duration: 101 QT Interval:  312 QTC Calculation: 454 R Axis:   -92 Text Interpretation:  Sinus tachycardia Baseline wander Non-specific ST-t changes Poor data quality Confirmed by Cathren Laine ([id number]) on 06/01/2017 8:11:45 AM       Radiology No results found.  Procedures Procedure Name: Intubation Date/Time: 06/01/2017 9:42 AM Performed by: Cathren Laine Pre-anesthesia Checklist: Patient identified, Emergency Drugs available, Suction available and Patient being monitored Oxygen Delivery Method: Non-rebreather mask Preoxygenation: Pre-oxygenation with 100% oxygen (nasal and mask) Induction Type: Rapid sequence Laryngoscope Size: Glidescope Tube size: 7.5 mm Number of attempts: 1 Placement Confirmation: ETT inserted through vocal cords under direct vision,  CO2 detector and Breath sounds checked- equal and bilateral Secured at: 24 cm      (including critical care time)  Medications Ordered in ED Medications  0.9 %  sodium chloride infusion ( Intravenous New Bag/Given 06/01/17 0755)  amiodarone  (NEXTERONE) 1.8 mg/mL load via infusion 150 mg (150 mg Intravenous Bolus from Bag 06/01/17 0752)    Followed by  amiodarone (NEXTERONE PREMIX) 360-4.14 MG/200ML-% (1.8 mg/mL) IV infusion (60 mg/hr Intravenous New Bag/Given 06/01/17 0806)    Followed by  amiodarone (NEXTERONE  PREMIX) 360-4.14 MG/200ML-% (1.8 mg/mL) IV infusion (not administered)  etomidate (AMIDATE) injection (20 mg Intravenous Given 06/01/17 0807)  succinylcholine (ANECTINE) injection (120 mg Intravenous Given 06/01/17 0807)  propofol (DIPRIVAN) 1000 MG/100ML infusion (not administered)  fentaNYL (SUBLIMAZE) injection 100 mcg (not administered)  fentaNYL (SUBLIMAZE) injection 100 mcg (not administered)  propofol (DIPRIVAN) 1000 MG/100ML infusion (10 mcg/kg/min  80 kg Intravenous New Bag/Given 06/01/17 0814)  albuterol (PROVENTIL) (2.5 MG/3ML) 0.083% nebulizer solution 5 mg (not administered)  0.9 %  sodium chloride infusion (not administered)     Initial Impression / Assessment and Plan / ED Course  I have reviewed the triage vital signs and the nursing notes.  Pertinent labs & imaging results that were available during my care of the patient were reviewed by me and considered in my medical decision making (see chart for details).  Iv x 2. Continuous pulse ox and monitor.  Ecg. Labs.   Pcxr.  Patient initially unresponsive.  EMS reports recently giving versed as pt flailing about.    Pt became agitated, flailing arms and legs bilaterally.  Pt unable to follow commands, staff unable to calm or redirect.  Pt opens eyes spontaneously, looks about, but appears unable to focus.    To secure airway, support ventilation, and gain control of patient to facilitate his workup, pt intubated.  Prior to intubation, preoxygenated with high flow nasal oxygen, and o2 mask.  sats 99%.    Etomidate and succ iv for RSI.  Intubated quickly on first attempt with glidescope, 7.5 ETT at 24 cm.  No complication. No aspiration.  bil bs and co2 color  change. Pcxr.   Cooling protocol initiated. Cardiology and critical care team consulted.   Discussed w wife.   Labs pending.   On rechecks, bp low. Additional ns bolus.  Glucose 440. hco3 9. Ivf. Insulin.  Cbgs.  pcxr with good tube position  bp remains low post bolus 2 liters. Additional ivf. Levophed.   Discussed pt with critical care team again including persistent low bp, now on levophed - they are coming to see.  Spouse does note daily etoh use, 4-6 beers per day. No hx etoh withdrawal.  Pt is difficult to sedate adequately on propofol despite titrating dose, and bp is low. Ativan iv.   Given relatively heavy etoh use hx, difficulty w good sedation, will try precedex.   CRITICAL CARE  RE s/p arrest w return of circulation, period of cpr w suspected anoxic brain injury, vfib. Hypotension.  Performed by: Suzi Roots Total critical care time: 100 minutes Critical care time was exclusive of separately billable procedures and treating other patients. Critical care was necessary to treat or prevent imminent or life-threatening deterioration. Critical care was time spent personally by me on the following activities: development of treatment plan with patient and/or surrogate as well as nursing, discussions with consultants, evaluation of patient's response to treatment, examination of patient, obtaining history from patient or surrogate, ordering and performing treatments and interventions, ordering and review of laboratory studies, ordering and review of radiographic studies, pulse oximetry and re-evaluation of patient's condition.     Final Clinical Impressions(s) / ED Diagnoses   Final diagnoses:  None    New Prescriptions New Prescriptions   No medications on file     Cathren Laine, MD 06/01/17 [phone]

## 2017-06-01 NOTE — Progress Notes (Addendum)
eLink Physician-Brief Progress Note Patient Name: Rick Logan DOB: 02/11/1956 MRN: [id number]   Date of Service  06/01/2017  HPI/Events of Note  Fever to 101.5 F - Not on Abx. AST and ALT elevated, therefore no Tylenol. Creatinine = 1.1.   eICU Interventions  Will order: 1. Blood Cultures X 2 now. 2. Tracheal aspirate culture now.  3. UA now.  4. Vancomycin and Zosyn per pharmacy consult.  5. Motrin suspension 400 mg per tube Q 6 hours PRN temp > 100.5 F.     Intervention Category Major Interventions: Infection - evaluation and management  Sommer,Steven Eugene 06/01/2017, 9:26 PM

## 2017-06-01 NOTE — H&P (Signed)
PULMONARY / CRITICAL CARE MEDICINE   Name: Rick Logan MRN: [id number] DOB: 03-21-1956    ADMISSION DATE:  06/01/2017 CONSULTATION DATE:  9/5  REFERRING MD:  stinl   CHIEF COMPLAINT:  VF arrest  HISTORY OF PRESENT ILLNESS:   This is a 62 year old male w/ h/o HTN and HL. He is an active smoker.  Was at work. Went out to smoke. Had witnessed syncopal event. EMS called. VF on tele. Shock x5, CPR estimated at ~20 minutes to ROSC -in ER: Awake, follows commands tries to communicate Requiring intermittent high pressors when sedated. When sedation light BP normalizes.  12 lead w/out ischemia  PAST MEDICAL HISTORY :  He  has a past medical history of Hyperlipidemia and Hypertension.  PAST SURGICAL HISTORY: He  has a past surgical history that includes Abdominal surgery.  No Known Allergies  No current facility-administered medications on file prior to encounter.    No current outpatient prescriptions on file prior to encounter.    FAMILY HISTORY:  His indicated that the status of his mother is unknown. He indicated that his father is deceased.    SOCIAL HISTORY: He  reports that he has been smoking.  He has never used smokeless tobacco. He reports that he does not drink alcohol or use drugs.  REVIEW OF SYSTEMS:   Unable   SUBJECTIVE:  Either sedated or thrashing in bed  VITAL SIGNS: BP (!) 88/59   Pulse 65   Temp (!) 95.4 F (35.2 C)   Resp (!) 24   Ht 6\' 1"  (1.854 m)   Wt 176 lb 5.9 oz (80 kg)   SpO2 100%   BMI 23.27 kg/m   HEMODYNAMICS:    VENTILATOR SETTINGS: Vent Mode: PRVC FiO2 (%):  [50 %-100 %] 50 % Set Rate:  [14 bmp] 14 bmp Vt Set:  [640 mL] 640 mL PEEP:  [5 cmH20] 5 cmH20 Plateau Pressure:  [16 cmH20] 16 cmH20  INTAKE / OUTPUT: No intake/output data recorded.  PHYSICAL EXAMINATION: General appearance:  61 Year old  Male, well nourished currently in acute distress, confused,   Eyes: anicteric sclerae, moist conjunctivae; PERRL, EOMI  bilaterally. Mouth:  membranes and no mucosal ulcerations; normal hard and soft palate, orally intubated Neck: Trachea midline; neck supple, no JVD Lungs/chest: exp wheeze, with normal respiratory effort and no intercostal retractions CV: RRR, no MRGs  Abdomen: Soft, non-tender; no masses or HSM Extremities: No peripheral edema or extremity lymphadenopathy Skin: Normal temperature, turgor and texture; no rash, ulcers or subcutaneous nodules Psych: agitated or sedated. Does f/c. Moves all extremities. Tries to communicate   LABS:  BMET  Recent Labs Lab 06/01/17 0742 06/01/17 0823  NA 138 139  K 3.9 4.0  CL 104 109  CO2 9*  --   BUN 7 11  CREATININE 1.64* 1.20  GLUCOSE 438* 407*    Electrolytes  Recent Labs Lab 06/01/17 0742  CALCIUM 8.8*    CBC  Recent Labs Lab 06/01/17 0742 06/01/17 0823  WBC 7.7  --   HGB 15.3 16.0  HCT 48.5 47.0  PLT 234  --     Coag's  Recent Labs Lab 06/01/17 0742  INR 1.37    Sepsis Markers No results for input(s): LATICACIDVEN, PROCALCITON, O2SATVEN in the last 168 hours.  ABG  Recent Labs Lab 06/01/17 1004  PHART 7.263*  PCO2ART 36.4  PO2ART 225.0*    Liver Enzymes  Recent Labs Lab 06/01/17 0742  AST 210*  ALT 220*  ALKPHOS  83  BILITOT 0.8  ALBUMIN 3.3*    Cardiac Enzymes No results for input(s): TROPONINI, PROBNP in the last 168 hours.  Glucose No results for input(s): GLUCAP in the last 168 hours.  Imaging Dg Chest Port 1 View  Result Date: 06/01/2017 CLINICAL DATA:  Cardiac arrest.  Endotracheal tube placement. EXAM: PORTABLE CHEST 1 VIEW COMPARISON:  None. FINDINGS: Endotracheal tube is in good position 5 cm above the carina. NG tube tip is below the diaphragm. Heart size and pulmonary vascularity are normal.  Lungs are clear. IMPRESSION: Tubes in good position.  Clear lungs. Electronically Signed   By: Francene Boyers M.D.   On: 06/01/2017 09:21     STUDIES:  ECHO  9/5>>>  CULTURES:   ANTIBIOTICS:   SIGNIFICANT EVENTS:   LINES/TUBES: oett 9/5>>>  DISCUSSION: Cardiac arrest Down time estimated at 20 minutes w/ 5 defibrillations -amazingly following commands -admit to ICU -cont tele, amio gtt -rx shock -cycle CEs -amio gtt -supportive care -asa -further recs for cards -avoid fever    ASSESSMENT / PLAN:   VF arrest (down time estimated at ~ 20 minutes) Cardiogenic shock-->suspect that this is being exacerbated by acidosis as well as sedation H/o HTN and HL -see by cards. -no ischemic changes on EKG P: Central access to assess CVP Cycle CEs CVP goal 8-12 MAP goal > 65 Cont amio Check SCVO2 after line placed Correct acidosis -->giving additional bicarb as concerned that  Now that he is sedated his acidosis is affecting his C.O  Acute hypoxic respiratory failure s/p cardiopulm arrest Tobacco abuse -->? Airflow limitations  PCXR: good ETT position; no edema  P: Full vent support Maximize Ve PAD protocol sched BDs-->RAS -2 F/u abg F/u PCXR    Metabolic acidosis + anion gap s/p ~ 20 minutes cardiac arrest so likely lactic acidosis from CPR NOT sepsis.  P: Trend LA (NOT for sepsis) Check Beta-Hydroxybutyric acid Repeat chemistry later this afternoon  Acute encephalopathy-->he does move all extremities and follows commands P:   RASS goal: -2 Avoid fever Support hemodynamics Dc Hypothermia protocol  CT head and cspine  Inadequate oral intake while orally intubated P: NPO for now PPI for SUP while on vent   Hyperglycemia r/o DKA Plan Insulin gtt Serial bmp Checking beta-hydroxybutyric acid NS until Glucose <250  FAMILY  - Updates:   - Inter-disciplinary family meet or Palliative Care meeting due by: 9/12  My time 60 minutes Simonne Martinet ACNP-BC [facility] Pulmonary/Critical Care Pager # [phone] OR # [phone] if no answer    06/01/2017, 10:52 AM

## 2017-06-01 NOTE — ED Notes (Signed)
Pt trashing and combative in bed. Fentanyl administered. EDP to bedside, no new orders. MD aware of blood pressure.

## 2017-06-01 NOTE — ED Notes (Signed)
Mittens in place. Pt able to move his arms freely.

## 2017-06-01 NOTE — Progress Notes (Signed)
eLink Physician-Brief Progress Note Patient Name: Rick Logan DOB: 07-06-1956 MRN: [id number]   Date of Service  06/01/2017  HPI/Events of Note  Troponin = 1.34. Likely d/t cardiac arrest and CPR. Cardiology in following the patient. Patient is already on ASA and Plavix.   eICU Interventions  Continue to cycle Troponin and current management.      Intervention Category Intermediate Interventions: Diagnostic test evaluation  Lenell Antu 06/01/2017, 6:41 PM

## 2017-06-01 NOTE — Progress Notes (Signed)
Responded to a CPR in progress to support patient, family and staff. Patient was unavailable.  Patient being examined by staff. Located family and spoke with spouse concerning what she was aware of. Per spouse  patient had gone to work and he wen out smoke and collapsed and was brought to the ED.  Provided empathetic listening, hospitality, and emotional  Support. Spouse is supported by friends. Referred  to [facility]  for continued support.    06/01/17 1004  Clinical Encounter Type  Visited With Family;Patient not available;Health care provider  Visit Type Initial;Spiritual support;Critical Care;ED;Trauma  Referral From Nurse  Consult/Referral To Chaplain  Spiritual Encounters  Spiritual Needs Emotional  Stress Factors  Patient Stress Factors None identified  Family Stress Factors Health changes  Maureen Ralphs, Chaplain

## 2017-06-01 NOTE — ED Notes (Signed)
Wallet with 32$ and cell phone given to wife.

## 2017-06-01 NOTE — Progress Notes (Signed)
  Echocardiogram 2D Echocardiogram has been performed.  Rick Logan 06/01/2017, 11:14 AM

## 2017-06-01 NOTE — Code Documentation (Signed)
Ice packs gained placed in groin.

## 2017-06-01 NOTE — Progress Notes (Signed)
Reported to ED to relieve Chaplain Cowan. Continued support to pt. and family. Patient is supported by wife Vernona Rieger and friends from work.  Patient is intubated and critical. EDP has spoken with patient wife.  Patient waiting for bed assignment.  Provided empathetic listening, ministry of presence and emotional support.  Will follow as needed.   06/01/17 0950  Clinical Encounter Type  Visited With Patient;Family;Patient and family together;Health care provider  Visit Type Initial;Spiritual support;Critical Care;ED;Trauma  Referral From [facility]  Spiritual Encounters  Spiritual Needs Emotional  Stress Factors  Family Stress Factors Major life changes  Fae Pippin, [facility], Pager [phone]

## 2017-06-01 NOTE — Procedures (Signed)
Central Venous Catheter Insertion Procedure Note Rick Logan [id number] 07/31/1956  Procedure: Insertion of Central Venous Catheter Indications: Assessment of intravascular volume, Drug and/or fluid administration and Frequent blood sampling  Procedure Details Consent: Risks of procedure as well as the alternatives and risks of each were explained to the (patient/caregiver).  Consent for procedure obtained. Time Out: Verified patient identification, verified procedure, site/side was marked, verified correct patient position, special equipment/implants available, medications/allergies/relevent history reviewed, required imaging and test results available.  Performed  Maximum sterile technique was used including antiseptics, cap, gloves, gown, hand hygiene, mask and sheet. Skin prep: Chlorhexidine; local anesthetic administered A antimicrobial bonded/coated triple lumen catheter was placed in the left internal jugular vein using the Seldinger technique.  Evaluation Blood flow good Complications: No apparent complications Patient did tolerate procedure well. Chest X-ray ordered to verify placement.  CXR: pending.  Jovita Kussmaul, AGACNP-BC [place] [facility]  Pgr: [phone]  PCCM Pgr: [phone]

## 2017-06-01 NOTE — Progress Notes (Signed)
eLink Physician-Brief Progress Note Patient Name: Rick Logan DOB: Feb 11, 1956 MRN: [id number]   Date of Service  06/01/2017  HPI/Events of Note  Hypotension - BP = 106/55 with MAP = 70 by A-line.   eICU Interventions  Will order: 1. Monitor CVP. 2. ABG STAT.      Intervention Category Major Interventions: Hypotension - evaluation and management  [name] 06/01/2017, 10:25 PM

## 2017-06-01 NOTE — ED Notes (Signed)
 of fentanyl given per CCM.

## 2017-06-01 NOTE — Progress Notes (Signed)
Pharmacy Antibiotic Note  Rick Logan is a 61 y.o. male admitted on 06/01/2017 with sepsis. Pharmacy has been consulted for Zosyn and vancomycin dosing.  SCr down to 1.11, CrCl ~51ml/min. Tmax up to 101.4  Plan: Start Zosyn 3.375 gm IV q8h (4 hour infusion) Give vancomycin 1.5g IV x 1, then start vancomycin 1g IV Q12h Monitor clinical picture, renal function, VT prn F/U C&S, abx deescalation / LOT  Height: 6\' 1"  (185.4 cm) Weight: 176 lb 5.9 oz (80 kg) IBW/kg (Calculated) : 79.9  Temp (24hrs), Avg:96.1 F (35.6 C), Min:94.6 F (34.8 C), Max:101.4 F (38.6 C)   Recent Labs Lab 06/01/17 0742 06/01/17 0823 06/01/17 1107 06/01/17 1325 06/01/17 1538 06/01/17 1845  WBC 7.7  --   --   --  16.6*  --   CREATININE 1.64* 1.20 1.40* 1.35*  --  1.11  LATICACIDVEN  --   --   --  3.0*  --   --     Estimated Creatinine Clearance: 77 mL/min (by C-G formula based on SCr of 1.11 mg/dL).    No Known Allergies  Antimicrobials this admission: Zosyn 9/5 >>  Vancomycin 9/5 >>  Dose adjustments this admission: n/a  Microbiology results: 9/5 BCx: sent 9/5 Sputum: sent  9/5 MRSA PCR: negative  Thank you for allowing pharmacy to be a part of this patient's care.  Enzo Bi, PharmD, BCPS Clinical Pharmacist Pager [phone] 06/01/2017 9:33 PM

## 2017-06-01 NOTE — Consult Note (Addendum)
Cardiology Consultation:   Patient ID: Rick Logan; [id number]; 09-14-1956   Admit date: 06/01/2017 Date of Consult: 06/01/2017  Primary Care Provider: Delorse Lek, MD Primary Cardiologist: New Primary Electrophysiologist:  None   Patient Profile:   Rick Logan is a 61 y.o. male with a hx of Hypertension, hyperlipidemia, and tobacco use who is being seen today for the evaluation of cardiac arrest due to ventricular fibrillation at the request of Dr. Cathren Laine.  History of Present Illness:   Rick Logan 61 year old who is intubated / sedated and unable to give history. Emergency medical personnel and staff report that he was at work, went outside to smoke a cigarette and had syncope. EMS was summoned and initially had pulses. He subsequently had ventricular fibrillation, required 20 minutes of CPR including application of the [place] device. No known history of cardiac disease but no records or family are available here in the emergency room.  Past Medical History:  Diagnosis Date  . Hyperlipidemia   . Hypertension     Past Surgical History:  Procedure Laterality Date  . ABDOMINAL SURGERY     No details available     Home Medications:  Prior to Admission medications   Medication Sig Start Date End Date Taking? Authorizing Provider  aspirin EC 81 MG tablet Take 81 mg by mouth daily.   Yes [provider]  atorvastatin (LIPITOR) 40 MG tablet Take 40 mg by mouth daily.   Yes [provider]  benazepril (LOTENSIN) 40 MG tablet Take 40 mg by mouth daily.   Yes [provider]  clopidogrel (PLAVIX) 75 MG tablet Take 75 mg by mouth daily.   Yes [provider]    Inpatient Medications: Scheduled Meds: . LORazepam  2 mg Intravenous Once  . LORazepam  2 mg Intravenous Once  . sodium bicarbonate  50 mEq Intravenous Once   Continuous Infusions: . sodium chloride 20 mL/hr at 06/01/17 0755  . sodium chloride    . amiodarone 60  mg/hr (06/01/17 0806)   Followed by  . amiodarone    . dextrose 5 % and 0.45% NaCl    . insulin (NOVOLIN-R) infusion    . propofol (DIPRIVAN) infusion 10 mcg/kg/min (06/01/17 0931)  . sodium chloride    . sodium chloride     PRN Meds: Place/Maintain arterial line **AND** sodium chloride, etomidate, fentaNYL (SUBLIMAZE) injection, fentaNYL (SUBLIMAZE) injection, succinylcholine  Allergies:   No Known Allergies  Social History:   Social History   Social History  . Marital status: Married    Spouse name: N/A  . Number of children: N/A  . Years of education: N/A   Occupational History  . Not on file.   Social History Main Topics  . Smoking status: Current Every Day Smoker  . Smokeless tobacco: Never Used  . Alcohol use No  . Drug use: No  . Sexual activity: Not Currently   Other Topics Concern  . Not on file   Social History Narrative  . No narrative on file    Family History:   History reviewed. No pertinent family history.  Unable to obtain a detailed family history status post cardiac arrest.  ROS:  Please see the history of present illness.  ROS  Unable to obtain review of systems due to cardiac arrest. All other ROS reviewed and negative.     Physical Exam/Data:   Vitals:   06/01/17 0904 06/01/17 0913 06/01/17 0915 06/01/17 0920  BP: (!) 71/46 (!) 54/40 (!) 47/35 [name])  55/40  Pulse: 87 92 91 87  Resp: (!) 29 (!) 21 (!) 23 (!) 27  Temp: (!) 94.8 F (34.9 C)  (!) 95.7 F (35.4 C) (!) 96.3 F (35.7 C)  TempSrc:      SpO2: 100% 93% 95% 100%  Weight:      Height:        Intake/Output Summary (Last 24 hours) at 06/01/17 0934 Last data filed at 06/01/17 0743  Gross per 24 hour  Intake              600 ml  Output                0 ml  Net              600 ml   Filed Weights   06/01/17 0800 06/01/17 0815  Weight: 176 lb 5.9 oz (80 kg) 176 lb 5.9 oz (80 kg)   Body mass index is 23.27 kg/m.  General:  Well nourished, well developed, Intubated, skin is  mottled. HEENT: normal Lymph: no adenopathy Neck: no JVD Endocrine:  No thryomegaly Vascular: No carotid bruits; FA pulses 2+ bilaterally without bruits  Cardiac:  normal S1, S2; RRR; no murmur . Lungs:  clear to auscultation bilaterally, no wheezing, rhonchi or rales . On ventilator with each inhalation, clicking in the sternum is heard consistent with fracture. Abd: soft, nontender, no hepatomegaly  Ext: no edema Musculoskeletal:  No deformities, BUE and BLE strength normal and equal Skin: warm and dry  Neuro:  Thrashing about. Moving extremities. Psych:  Unable to assess  EKG:  The EKG was personally reviewed and demonstrates:  Sinus rhythm/sinus tachycardia with no evidence of ischemia. Left axis deviation and possible old inferior infarction. Telemetry:  Telemetry was personally reviewed and demonstrates:  Sinus rhythm since arrival  Relevant CV Studies: None available  Laboratory Data:  Chemistry  Recent Labs Lab 06/01/17 0742 06/01/17 0823  NA 138 139  K 3.9 4.0  CL 104 109  CO2 9*  --   GLUCOSE 438* 407*  BUN 7 11  CREATININE 1.64* 1.20  CALCIUM 8.8*  --   GFRNONAA 43*  --   GFRAA 50*  --   ANIONGAP 25*  --      Recent Labs Lab 06/01/17 0742  PROT 5.7*  ALBUMIN 3.3*  AST 210*  ALT 220*  ALKPHOS 83  BILITOT 0.8   Hematology  Recent Labs Lab 06/01/17 0742 06/01/17 0823  WBC 7.7  --   RBC 4.82  --   HGB 15.3 16.0  HCT 48.5 47.0  MCV 100.6*  --   MCH 31.7  --   MCHC 31.5  --   RDW 13.2  --   PLT 234  --    Cardiac EnzymesNo results for input(s): TROPONINI in the last 168 hours.   Recent Labs Lab 06/01/17 0757  TROPIPOC 0.03    BNPNo results for input(s): BNP, PROBNP in the last 168 hours.  DDimer No results for input(s): DDIMER in the last 168 hours.  Radiology/Studies:  Dg Chest Port 1 View  Result Date: 06/01/2017 CLINICAL DATA:  Cardiac arrest.  Endotracheal tube placement. EXAM: PORTABLE CHEST 1 VIEW COMPARISON:  None. FINDINGS:  Endotracheal tube is in good position 5 cm above the carina. NG tube tip is below the diaphragm. Heart size and pulmonary vascularity are normal.  Lungs are clear. IMPRESSION: Tubes in good position.  Clear lungs. Electronically Signed   By: Francene Boyers M.D.  On: 06/01/2017 09:21    Assessment and Plan:   1. Ventricular fibrillation-related cardiac arrest with prolonged CPR including application of [place] device. No acute ischemic changes are noted on electrocardiogram. With prolonged CPR and in absence of evidence of ongoing infarction, would recommend supporting the patient medically including addition of pressors and other support as indicated. Cardiac markers need to be cycled and  urgent echocardiogram will be ordered to assess LV function. 2. Syncope preceding ventricular fibrillation 3. Metabolic acidosis, likely secondary to prolonged CPR time. Rule out DKA 4. Acute kidney injury suspected with creatinine of 1.7 5. Hypoglycemia 6. Tobacco abuse 7. Hypertension 8. Hyperlipidemia   Signed, Lesleigh Noe, MD  06/01/2017 9:34 AM

## 2017-06-01 NOTE — Progress Notes (Signed)
eLink Physician-Brief Progress Note Patient Name: Rick Logan DOB: [birth year] MRN: [id number]   Date of Service  06/01/2017  HPI/Events of Note  Agitation - Request for bilateral soft wrist restraints.   eICU Interventions  Will order bilateral soft wrist restraints.     Intervention Category Minor Interventions: Agitation / anxiety - evaluation and management  Mohit Zirbes Eugene 06/01/2017, 8:51 PM

## 2017-06-01 NOTE — ED Triage Notes (Addendum)
[facility]- pt coming from work, called out initially for a syncopal episode. Pt collapsed, fire dept initially had a pulse. Pt lost pulses, in vfib, shocked a total of 5 times with EMS. 2mg  of epi given. Pt regained pulses. Total of approximately 20 minutes of CPR. Pt also received 2.5mg  of versed.

## 2017-06-02 ENCOUNTER — Inpatient Hospital Stay (HOSPITAL_COMMUNITY): Payer: 59

## 2017-06-02 DIAGNOSIS — I214 Non-ST elevation (NSTEMI) myocardial infarction: Secondary | ICD-10-CM

## 2017-06-02 LAB — BASIC METABOLIC PANEL
ANION GAP: 8 (ref 5–15)
ANION GAP: 7 (ref 5–15)
BUN: 9 mg/dL (ref 6–20)
BUN: 6 mg/dL (ref 6–20)
CALCIUM: 6.9 mg/dL — AB (ref 8.9–10.3)
CALCIUM: 7.3 mg/dL — AB (ref 8.9–10.3)
CHLORIDE: 114 mmol/L — AB (ref 101–111)
CO2: 16 mmol/L — AB (ref 22–32)
CO2: 20 mmol/L — ABNORMAL LOW (ref 22–32)
Chloride: 109 mmol/L (ref 101–111)
Creatinine, Ser: 0.9 mg/dL (ref 0.61–1.24)
Creatinine, Ser: 1.16 mg/dL (ref 0.61–1.24)
GFR calc Af Amer: >60 mL/min (ref >60)
GFR calc non Af Amer: >60 mL/min (ref >60)
GLUCOSE: 130 mg/dL — AB (ref 65–99)
Glucose, Bld: 122 mg/dL — ABNORMAL HIGH (ref 65–99)
POTASSIUM: 2.9 mmol/L — AB (ref 3.5–5.1)
POTASSIUM: 3.5 mmol/L (ref 3.5–5.1)
SODIUM: 136 mmol/L (ref 135–145)
Sodium: 138 mmol/L (ref 135–145)

## 2017-06-02 LAB — GLUCOSE, CAPILLARY
Glucose-Capillary: 122 mg/dL — ABNORMAL HIGH (ref 65–99)
Glucose-Capillary: 115 mg/dL — ABNORMAL HIGH (ref 65–99)
Glucose-Capillary: 127 mg/dL — ABNORMAL HIGH (ref 65–99)

## 2017-06-02 LAB — BLOOD GAS, ARTERIAL
Acid-base deficit: 5.7 mmol/L — ABNORMAL HIGH (ref 0.0–2.0)
BICARBONATE: 17.2 mmol/L — AB (ref 20.0–28.0)
Drawn by: 50222
FIO2: 40
LHR: 24 {breaths}/min
O2 Saturation: 98.8 %
PEEP/CPAP: 5 cmH2O
Patient temperature: 98.6
VT: 640 mL
pCO2 arterial: 23.2 mmHg — ABNORMAL LOW (ref 32.0–48.0)
pH, Arterial: 7.482 — ABNORMAL HIGH (ref 7.350–7.450)
pO2, Arterial: 134 mmHg — ABNORMAL HIGH (ref 83.0–108.0)

## 2017-06-02 LAB — CBC
HEMATOCRIT: 36.6 % — AB (ref 39.0–52.0)
HEMOGLOBIN: 12.7 g/dL — AB (ref 13.0–17.0)
MCH: 31.5 pg (ref 26.0–34.0)
MCHC: 34.7 g/dL (ref 30.0–36.0)
MCV: 90.8 fL (ref 78.0–100.0)
Platelets: 206 10*3/uL (ref 150–400)
RBC: 4.03 MIL/uL — AB (ref 4.22–5.81)
RDW: 13.7 % (ref 11.5–15.5)
WBC: 17.2 10*3/uL — ABNORMAL HIGH (ref 4.0–10.5)

## 2017-06-02 LAB — MAGNESIUM
MAGNESIUM: 1.5 mg/dL — AB (ref 1.7–2.4)
MAGNESIUM: 2.7 mg/dL — AB (ref 1.7–2.4)

## 2017-06-02 LAB — TROPONIN I: Troponin I: 21.97 ng/mL (ref <0.03)

## 2017-06-02 LAB — HEPARIN LEVEL (UNFRACTIONATED): Heparin Unfractionated: 0.12 IU/mL — ABNORMAL LOW (ref 0.30–0.70)

## 2017-06-02 LAB — HIV ANTIBODY (ROUTINE TESTING W REFLEX): HIV SCREEN 4TH GENERATION: NONREACTIVE

## 2017-06-02 LAB — PHOSPHORUS: Phosphorus: 2.2 mg/dL — ABNORMAL LOW (ref 2.5–4.6)

## 2017-06-02 LAB — TRIGLYCERIDES: TRIGLYCERIDES: 151 mg/dL — AB (ref <150)

## 2017-06-02 MED ORDER — ASPIRIN 81 MG PO CHEW
81.0000 mg | CHEWABLE_TABLET | Freq: Every day | ORAL | Status: DC
  Administered 2017-06-03 – 2017-06-07 (×5): 81 mg via ORAL
  Filled 2017-06-02 (×5): qty 1

## 2017-06-02 MED ORDER — SODIUM PHOSPHATES 45 MMOLE/15ML IV SOLN
10.0000 mmol | Freq: Once | INTRAVENOUS | Status: AC
  Administered 2017-06-02: 10 mmol via INTRAVENOUS
  Filled 2017-06-02: qty 3.33

## 2017-06-02 MED ORDER — FENTANYL CITRATE (PF) 100 MCG/2ML IJ SOLN
50.0000 ug | Freq: Once | INTRAMUSCULAR | Status: AC
  Administered 2017-06-02: 25 ug via INTRAVENOUS
  Filled 2017-06-02: qty 2

## 2017-06-02 MED ORDER — SODIUM CHLORIDE 0.9 % IV SOLN
6.0000 g | Freq: Once | INTRAVENOUS | Status: AC
  Administered 2017-06-02: 6 g via INTRAVENOUS
  Filled 2017-06-02: qty 2

## 2017-06-02 MED ORDER — HEPARIN (PORCINE) IN NACL 100-0.45 UNIT/ML-% IJ SOLN
1650.0000 [IU]/h | INTRAMUSCULAR | Status: DC
Start: 2017-06-02 — End: 2017-06-03
  Administered 2017-06-02: 950 [IU]/h via INTRAVENOUS
  Administered 2017-06-03: 1350 [IU]/h via INTRAVENOUS
  Filled 2017-06-02 (×4): qty 250

## 2017-06-02 MED ORDER — HEPARIN BOLUS VIA INFUSION
4000.0000 [IU] | Freq: Once | INTRAVENOUS | Status: AC
  Administered 2017-06-02: 4000 [IU] via INTRAVENOUS
  Filled 2017-06-02: qty 4000

## 2017-06-02 MED ORDER — CLOPIDOGREL BISULFATE 75 MG PO TABS
75.0000 mg | ORAL_TABLET | Freq: Every day | ORAL | Status: DC
  Administered 2017-06-03 – 2017-06-05 (×3): 75 mg via ORAL
  Filled 2017-06-02 (×4): qty 1

## 2017-06-02 MED ORDER — IBUPROFEN 200 MG PO TABS
400.0000 mg | ORAL_TABLET | Freq: Four times a day (QID) | ORAL | Status: DC | PRN
  Administered 2017-06-02 – 2017-06-07 (×10): 400 mg via ORAL
  Filled 2017-06-02 (×11): qty 2

## 2017-06-02 MED ORDER — POTASSIUM CHLORIDE 20 MEQ/15ML (10%) PO SOLN
40.0000 meq | ORAL | Status: AC
  Administered 2017-06-02: 40 meq
  Filled 2017-06-02 (×3): qty 30

## 2017-06-02 MED ORDER — ATORVASTATIN CALCIUM 40 MG PO TABS
40.0000 mg | ORAL_TABLET | Freq: Every day | ORAL | Status: DC
  Filled 2017-06-02 (×2): qty 1

## 2017-06-02 MED ORDER — ORAL CARE MOUTH RINSE
15.0000 mL | Freq: Two times a day (BID) | OROMUCOSAL | Status: DC
  Administered 2017-06-02 – 2017-06-03 (×2): 15 mL via OROMUCOSAL

## 2017-06-02 NOTE — Progress Notes (Signed)
ANTICOAGULATION CONSULT NOTE - Initial Consult  Pharmacy Consult for heparin Indication: chest pain/ACS  No Known Allergies  Patient Measurements: Height: 6\' 1"  (185.4 cm) Weight: 187 lb 6.3 oz (85 kg) IBW/kg (Calculated) : 79.9 Heparin Dosing Weight: 80 kg  Assessment: CC/HPI: 61 yo male admitted after having a witnessed syncopal event at work > EMS found him in VF > shock x 5 > CPR ~20 min. On heparin for ACS. First heparin level is low at 0.12. H&H 12.7/36.6, Plt 206  Goal of Therapy:  Heparin level 0.3-0.7 units/ml Monitor platelets by anticoagulation protocol: Yes   Plan:  Increase heparin gtt to 1,200 units/hr Check 6 hour heparin level Monitor daily heparin level, CBC, s/s of bleed  Rick Logan, PharmD, BCPS Clinical Pharmacist Pager [phone] 06/02/2017 6:30 PM

## 2017-06-02 NOTE — Progress Notes (Signed)
ANTICOAGULATION CONSULT NOTE - Initial Consult  Pharmacy Consult for heparin Indication: chest pain/ACS  No Known Allergies  Patient Measurements: Height: 6\' 1"  (185.4 cm) Weight: 187 lb 6.3 oz (85 kg) IBW/kg (Calculated) : 79.9 Heparin Dosing Weight: 80 kg  Vital Signs: Temp: 98.8 F (37.1 C) (09/06 0743) Temp Source: Oral (09/06 0743) BP: 101/64 (09/06 0900) Pulse Rate: 68 (09/06 0900)  Labs:  Recent Labs  06/01/17 0742 06/01/17 0823  06/01/17 1325 06/01/17 1538 06/01/17 1845 06/02/17 0307  HGB 15.3 16.0  --   --  13.8  --  12.7*  HCT 48.5 47.0  --   --  40.8  --  36.6*  PLT 234  --   --   --  255  --  206  LABPROT 16.8*  --   --   --   --   --   --   INR 1.37  --   --   --   --   --   --   CREATININE 1.64* 1.20  < > 1.35*  --  1.11 0.90  TROPONINI  --   --   --   --  1.34*  --  21.97*  < > = values in this interval not displayed.  Estimated Creatinine Clearance: 94.9 mL/min (by C-G formula based on SCr of 0.9 mg/dL).  Assessment: CC/HPI: 61 yo male admitted after having a witnessed syncopal event at work > EMS found him in VF > shock x 5 > CPR ~20 min  PMH: HLD, HTN, Smoker  Anticoag: none pta; iv hep for ACS  CV: vfib arrest PTA; possible ACS - trop 21.97 VSS on amio gtt levo @ 20  Renal: SCr 0.9, K 2.9, Mg 1.5, Phos 2.2 - replaced  Heme/Onc: H&H 12.7/36.6, Plt 206  Goal of Therapy:  Heparin level 0.3-0.7 units/ml Monitor platelets by anticoagulation protocol: Yes   Plan:  Hep bolus 4000 units x 1 Heparin gtt 950 units/hr Initial hep lvl 1700 Daily HL, CBC F/U Cards plans for cath  Isaac Bliss, PharmD, BCPS, BCCCP Clinical Pharmacist Clinical phone for 06/02/2017 from 7a-3:30p: [phone] If after 3:30p, please call main pharmacy at: [phone] 06/02/2017 9:42 AM

## 2017-06-02 NOTE — Progress Notes (Signed)
Wasted 225 mL of Fentanyl in sink with Doctor, hospital.

## 2017-06-02 NOTE — Progress Notes (Signed)
CRITICAL VALUE ALERT  Critical Value: Troponin 21.97  Date & Time Notied:  06/02/2017  1655  Provider Notified: Dr. Darrick Penna via Marisue Ivan in E-link   Orders Received/Actions taken: New orders pending.

## 2017-06-02 NOTE — Progress Notes (Signed)
eLink Physician-Brief Progress Note Patient Name: Eston Herrington DOB: April 05, 1956 MRN: [id number]   Date of Service  06/02/2017  HPI/Events of Note  Pain from CPR  eICU Interventions  Fentanyl 50 mcg IV x1     Intervention Category Major Interventions: Other:  YACOUB,WESAM 06/02/2017, 11:14 PM

## 2017-06-02 NOTE — Progress Notes (Signed)
eLink Physician-Brief Progress Note Patient Name: [name] DOB: 1956/07/26 MRN: [id number]   Date of Service  06/02/2017  HPI/Events of Note  Patient c/o chest wall pain related to CPR. Creatinine = 0.9.  eICU Interventions  Will order: 1. Motrin 400 mg PO Q 6 hours PRN pain.      Intervention Category Intermediate Interventions: Pain - evaluation and management  Sommer,Steven Eugene 06/02/2017, 6:46 PM

## 2017-06-02 NOTE — Procedures (Signed)
Extubation Procedure Note  Patient Details:   Name: Geoge Double DOB: 1956-03-29 MRN: [id number]   Airway Documentation:     Evaluation  O2 sats: stable throughout Complications: No apparent complications Patient did tolerate procedure well. Bilateral Breath Sounds: Diminished, Fine crackles   Yes pt able to vocalize.   Pt extubated at this time per MD order. Pt was able to breathe around deflated cuff. Pt placed on 4L [place]. IS performed with return of 1250-1530mLx4. No stridor noted.    Loyal Jacobson [facility] 06/02/2017, 9:41 AM

## 2017-06-02 NOTE — Progress Notes (Signed)
PULMONARY / CRITICAL CARE MEDICINE   Name: Rick Logan MRN: [id number] DOB: 07/06/1956    ADMISSION DATE:  06/01/2017  REFERRING MD: Dr. Denton Lank, ER  CHIEF COMPLAINT:  VF arrest  HISTORY OF PRESENT ILLNESS:   61 yo male smoker with witnessed arrest at work with VR.  ROSC in 20 minutes.  Intact mental status after resuscitation.  Found to have acute systolic CHF with cardiogenic shock.  Hx of HTN, HLD.  SUBJECTIVE:  Tolerating pressure support.  VITAL SIGNS: BP 101/64   Pulse 68   Temp 98.8 F (37.1 C) (Oral)   Resp (!) 22   Ht 6\' 1"  (1.854 m)   Wt 187 lb 6.3 oz (85 kg)   SpO2 100%   BMI 24.72 kg/m   HEMODYNAMICS: CVP:  [18 mmHg-31 mmHg] 18 mmHg  VENTILATOR SETTINGS: Vent Mode: CPAP;PSV FiO2 (%):  [40 %-50 %] 40 % Set Rate:  [24 bmp] 24 bmp Vt Set:  [640 mL] 640 mL PEEP:  [5 cmH20] 5 cmH20 Pressure Support:  [5 cmH20] 5 cmH20 Plateau Pressure:  [18 cmH20-21 cmH20] 18 cmH20  INTAKE / OUTPUT: I/O last 3 completed shifts: In: 6014.3 [I.V.:3014.3; IV Piggyback:3000] Out: 1060 [Urine:1060]  PHYSICAL EXAMINATION:  General - alert Eyes - pupils reactive ENT - ETT in place Cardiac - regular, no murmur Chest - no wheeze, rales Abd - soft, non tender Ext - no edema Skin - no rashes Neuro - normal strength    LABS:  BMET  Recent Labs Lab 06/01/17 1107 06/01/17 1325 06/01/17 1845 06/02/17 0307  NA 140  --  141 138  K 3.4*  --  3.5 2.9*  CL 110  --  115* 114*  CO2 19*  --  18* 16*  BUN 10  --  13 9  CREATININE 1.40* 1.35* 1.11 0.90  GLUCOSE 287*  --  113* 122*    Electrolytes  Recent Labs Lab 06/01/17 1107 06/01/17 1325 06/01/17 1845 06/02/17 0307  CALCIUM 7.3*  --  7.1* 6.9*  MG  --   --   --  1.5*  PHOS  --  3.0  --  2.2*    CBC  Recent Labs Lab 06/01/17 0742 06/01/17 0823 06/01/17 1538 06/02/17 0307  WBC 7.7  --  16.6* 17.2*  HGB 15.3 16.0 13.8 12.7*  HCT 48.5 47.0 40.8 36.6*  PLT 234  --  255 206    Coag's  Recent  Labs Lab 06/01/17 0742  INR 1.37    Sepsis Markers  Recent Labs Lab 06/01/17 1325  LATICACIDVEN 3.0*  PROCALCITON 0.33    ABG  Recent Labs Lab 06/01/17 1004 06/01/17 1412 06/01/17 2340  PHART 7.263* 7.246* 7.482*  PCO2ART 36.4 41.3 23.2*  PO2ART 225.0* 96.9 134*    Liver Enzymes  Recent Labs Lab 06/01/17 0742  AST 210*  ALT 220*  ALKPHOS 83  BILITOT 0.8  ALBUMIN 3.3*    Cardiac Enzymes  Recent Labs Lab 06/01/17 1538 06/02/17 0307  TROPONINI 1.34* 21.97*    Glucose  Recent Labs Lab 06/01/17 1319 06/01/17 1549 06/01/17 1948 06/01/17 2315 06/02/17 0352 06/02/17 0740  GLUCAP 226* 165* 93 126* 122* 115*    Imaging Dg Chest Port 1 View  Result Date: 06/02/2017 CLINICAL DATA:  Advancement of the nasogastric tube by 10 cm is recommended to assure that the proximal port is positioned below the GE junction. EXAM: PORTABLE CHEST 1 VIEW COMPARISON:  Portable chest x-ray of June 01, 2017 FINDINGS: There is increased density at  both bases greatest on the left. There is no pleural effusion or pneumothorax. The heart and pulmonary vascularity are normal. The endotracheal tube tip projects approximately 4.4 cm above the carina. The esophagogastric tube has its proximal port at the level of the GE junction with the tip in the cardia. There is calcification in the wall of the aortic arch. The left internal jugular venous catheter tip projects over the proximal SVC. IMPRESSION: Interval development of bibasilar atelectasis or pneumonia greatest on the left. Advancement of the nasogastric tube by 5-10 cm is recommended to assure that the proximal port is positioned below the GE junction. Electronically Signed   By: David  [place] M.D.   On: 06/02/2017 07:16   Dg Chest Port 1 View  Result Date: 06/01/2017 CLINICAL DATA:  Central line placement EXAM: PORTABLE CHEST 1 VIEW COMPARISON:  06/01/2017 FINDINGS: Left internal jugular central line is in place with the tip at the  confluence of the innominate veins. Endotracheal tube and NG tube are unchanged. No pneumothorax. Heart is normal size. No confluent airspace opacities or effusions. IMPRESSION: Left central line tip at the confluence of the innominate veins. No pneumothorax. No acute findings. Electronically Signed   By: Charlett Nose M.D.   On: 06/01/2017 11:57     STUDIES:  ECHO 9/5>>>EF 30 to 35%, grade 1 DD  CULTURES: Blood 9/05 >> Sputum 9/05 >>  ANTIBIOTICS: Vancomycin 9/05 >> Zosyn 9/05 >>  SIGNIFICANT EVENTS: 9/05 Admit, cardiology consult, fever 101.5  LINES/TUBES: ETT 9/05 >> 9/06 Lt IJ CVL 9/05 >> A line 9/05 >>  DISCUSSION: 61 yo male smoker with VF arrest, acute systolic CHF with cardiogenic shock, fever.  ASSESSMENT / PLAN:  Acute hypoxic respiratory failure. Tobacco abuse with presumed COPD. Aspiration pneumonia. - extubate - scheduled BDs - f/u CXR - bronchial hygiene - day 2 of Abx  VF cardiac arrest. NSTEMI. Acute systolic CHF with cardiogenic shock. Hx of HTN, HLD. - cardiology following - wean pressors to keep MAP > 65 - ASA, lipitor, plavix - heparin, amiodarone gtt  Lactic acidosis >> resolved. Non gap acidosis. Hypokalemia. Hypomagnesemia. Hypophosphatemia. - f/u BMET  Hyperglycemia. - resolved - d/c SSI  DVT prophylaxis - SQ heparin SUP - Protonix Nutrition - advance to heart healthy diet Goals of care - full code  Updated pt's 2 daughters at bedside  CC time 33 minutes  Coralyn Helling, MD [facility] Pulmonary/Critical Care 06/02/2017, 9:30 AM Pager:  [phone] After 3pm call: [phone]

## 2017-06-02 NOTE — Progress Notes (Signed)
Progress Note  Patient Name: Rick Logan Date of Encounter: 06/02/2017  Primary Cardiologist: Linard Millers (new)  Subjective   Daughters are at his bedside. The patient is awake. Aggravated with endotracheal tube. Having chest pain that is related to sternal fracture from [facility] device which was used during CPR.  Inpatient Medications    Scheduled Meds: . aspirin  81 mg Per Tube Daily  . atorvastatin  40 mg Per Tube Daily  . chlorhexidine gluconate (MEDLINE KIT)  15 mL Mouth Rinse BID  . clopidogrel  75 mg Per Tube Daily  . fentaNYL (SUBLIMAZE) injection  50 mcg Intravenous Once  . heparin  5,000 Units Subcutaneous Q8H  . insulin aspart  2-6 Units Subcutaneous Q4H  . ipratropium-albuterol  3 mL Nebulization Q6H  . mouth rinse  15 mL Mouth Rinse QID  . pantoprazole (PROTONIX) IV  40 mg Intravenous QHS  . potassium chloride  40 mEq Per Tube Q4H   Continuous Infusions: . sodium chloride 20 mL/hr at 06/01/17 0755  . sodium chloride    . sodium chloride 125 mL/hr (06/02/17 0315)  . amiodarone 30 mg/hr (06/02/17 0800)  . fentaNYL infusion INTRAVENOUS Stopped (06/02/17 0845)  . magnesium sulfate LVP 250-500 ml 6 g (06/02/17 0747)  . norepinephrine (LEVOPHED) Adult infusion 20 mcg/min (06/02/17 0800)  . piperacillin-tazobactam (ZOSYN)  IV Stopped (06/02/17 0809)  . propofol (DIPRIVAN) infusion Stopped (06/02/17 0800)  . sodium phosphate  Dextrose 5% IVPB 10 mmol (06/02/17 0747)  . vancomycin     PRN Meds: Place/Maintain arterial line **AND** sodium chloride, etomidate, fentaNYL, ibuprofen, midazolam, midazolam, succinylcholine   Vital Signs    Vitals:   06/02/17 0700 06/02/17 0743 06/02/17 0746 06/02/17 0800  BP: (!) 90/59  102/62 111/64  Pulse: (!) 55  65 66  Resp: (!) '24  19 14  ' Temp:  98.8 F (37.1 C)    TempSrc:  Oral    SpO2: 100%  100% 100%  Weight:      Height:        Intake/Output Summary (Last 24 hours) at 06/02/17 0907 Last data filed at 06/02/17 0800  Gross per 24 hour  Intake          7954.19 ml  Output             1075 ml  Net          6879.19 ml   Filed Weights   06/01/17 0800 06/01/17 0815 06/02/17 0208  Weight: 176 lb 5.9 oz (80 kg) 176 lb 5.9 oz (80 kg) 187 lb 6.3 oz (85 kg)    Telemetry    Normal sinus rhythm without ventricular ectopy. - Personally Reviewed  ECG    Sinus bradycardia, poor R-wave progression, diffuse, predominantly precordial T-wave inversion. QT prolongation. - Personally Reviewed  Physical Exam  Awake, motioning towards the endotracheal tube requesting it be removed. GEN: No acute distress.   Neck: No JVD Cardiac: RRR, no murmurs, rubs, or gallops.  Respiratory: Clear to auscultation bilaterally. GI: Soft, nontender, non-distended  MS: No edema; No deformity. Neuro:  Nonfocal  Psych: Normal affect   Labs    Chemistry Recent Labs Lab 06/01/17 0742  06/01/17 1107 06/01/17 1325 06/01/17 1845 06/02/17 0307  NA 138  < > 140  --  141 138  K 3.9  < > 3.4*  --  3.5 2.9*  CL 104  < > 110  --  115* 114*  CO2 9*  --  19*  --  18*  16*  GLUCOSE 438*  < > 287*  --  113* 122*  BUN 7  < > 10  --  13 9  CREATININE 1.64*  < > 1.40* 1.35* 1.11 0.90  CALCIUM 8.8*  --  7.3*  --  7.1* 6.9*  PROT 5.7*  --   --   --   --   --   ALBUMIN 3.3*  --   --   --   --   --   AST 210*  --   --   --   --   --   ALT 220*  --   --   --   --   --   ALKPHOS 83  --   --   --   --   --   BILITOT 0.8  --   --   --   --   --   GFRNONAA 43*  --  52* 54* >60 >60  GFRAA 50*  --  >60 >60 >60 >60  ANIONGAP 25*  --  11  --  8 8  < > = values in this interval not displayed.   Hematology Recent Labs Lab 06/01/17 0742 06/01/17 0823 06/01/17 1538 06/02/17 0307  WBC 7.7  --  16.6* 17.2*  RBC 4.82  --  4.36 4.03*  HGB 15.3 16.0 13.8 12.7*  HCT 48.5 47.0 40.8 36.6*  MCV 100.6*  --  93.6 90.8  MCH 31.7  --  31.7 31.5  MCHC 31.5  --  33.8 34.7  RDW 13.2  --  13.6 13.7  PLT 234  --  255 206    Cardiac Enzymes Recent  Labs Lab 06/01/17 1538 06/02/17 0307  TROPONINI 1.34* 21.97*    Recent Labs Lab 06/01/17 0757 06/01/17 1041  TROPIPOC 0.03 0.65*     BNP Recent Labs Lab 06/01/17 1326  BNP 22.6     DDimer No results for input(s): DDIMER in the last 168 hours.   Radiology    Dg Chest Port 1 View  Result Date: 06/02/2017 CLINICAL DATA:  Advancement of the nasogastric tube by 10 cm is recommended to assure that the proximal port is positioned below the GE junction. EXAM: PORTABLE CHEST 1 VIEW COMPARISON:  Portable chest x-ray of June 01, 2017 FINDINGS: There is increased density at both bases greatest on the left. There is no pleural effusion or pneumothorax. The heart and pulmonary vascularity are normal. The endotracheal tube tip projects approximately 4.4 cm above the carina. The esophagogastric tube has its proximal port at the level of the GE junction with the tip in the cardia. There is calcification in the wall of the aortic arch. The left internal jugular venous catheter tip projects over the proximal SVC. IMPRESSION: Interval development of bibasilar atelectasis or pneumonia greatest on the left. Advancement of the nasogastric tube by 5-10 cm is recommended to assure that the proximal port is positioned below the GE junction. Electronically Signed   By: David  Martinique M.D.   On: 06/02/2017 07:16   Dg Chest Port 1 View  Result Date: 06/01/2017 CLINICAL DATA:  Central line placement EXAM: PORTABLE CHEST 1 VIEW COMPARISON:  06/01/2017 FINDINGS: Left internal jugular central line is in place with the tip at the confluence of the innominate veins. Endotracheal tube and NG tube are unchanged. No pneumothorax. Heart is normal size. No confluent airspace opacities or effusions. IMPRESSION: Left central line tip at the confluence of the innominate veins. No pneumothorax. No acute findings. Electronically  Signed   By: Rolm Baptise M.D.   On: 06/01/2017 11:57   Dg Chest Port 1 View  Result Date:  06/01/2017 CLINICAL DATA:  Cardiac arrest.  Endotracheal tube placement. EXAM: PORTABLE CHEST 1 VIEW COMPARISON:  None. FINDINGS: Endotracheal tube is in good position 5 cm above the carina. NG tube tip is below the diaphragm. Heart size and pulmonary vascularity are normal.  Lungs are clear. IMPRESSION: Tubes in good position.  Clear lungs. Electronically Signed   By: Lorriane Shire M.D.   On: 06/01/2017 09:21    Cardiac Studies   2-D Doppler echocardiogram 06/01/2017: Study Conclusions  - Left ventricle: The cavity size was normal. Wall thickness was   normal. Systolic function was moderately to severely reduced. The   estimated ejection fraction was in the range of 30% to 35%. There   is akinesis of the mid-apicalanteroseptal and apical myocardium.   Doppler parameters are consistent with abnormal left ventricular   relaxation (grade 1 diastolic dysfunction).  Impressions:  - Technically difficult; definity used; akinesis of the distal   anteroseptal and apical walls with overall moderate to severe LV   dysfunction; mild diastolic dysfunction; trace TR.   Patient Profile     61 y.o. male with a hx of Hypertension, hyperlipidemia, and tobacco use who is being seen today for the evaluation of cardiac arrest due to ventricular fibrillation. Prior smoker, presented with severe metabolic acidosis, metabolic encephalopathy, acute hypoxic respiratory failure, and shock post resuscitation. Blood sugars were extremely elevated.  Assessment & Plan    1. Hypokalemia and amiodarone therapy are causing QT prolongation. Potassium needs to be kept greater than 4.0 while on amiodarone. This will help avoid polymorphic ventricular tachycardia. 2. Ventricular fibrillation cardiac arrest, successfully resuscitated. Likely ischemically mediated especially given the suggestion of anterior apical wall motion abnormality on echocardiography area 3. Probable coronary artery disease. Echo demonstrates  regional wall motion abnormality involving predominantly the mid to distal anteroapical region. The cardiac markers do demonstrate significant a significant troponin bump. When the patient's EKG and presentation are taken into consideration, at the very least he has anteroapical non-ST elevation myocardial infarction. Needs IV anticoagulation if no contraindications. 4. Hypoxic respiratory failure, hopefully will be extubated today.  Recommend coronary angiography when clinically more stable or if he has recurrent evidence of suspected myocardial ischemia. We will plan coronary angiography for tomorrow if possible. IV heparin is recommended. Agree with Plavix.  Signed, Sinclair Grooms, MD  06/02/2017, 9:07 AM

## 2017-06-02 NOTE — Progress Notes (Signed)
[facility] ADULT ICU REPLACEMENT PROTOCOL FOR AM LAB REPLACEMENT ONLY  The patient does apply for the [facility] Adult ICU Electrolyte Replacment Protocol based on the criteria listed below:   1. Is GFR >/= 40 ml/min? Yes.    Patient's GFR today is >60 2. Is urine output >/= 0.5 ml/kg/hr for the last 6 hours? Yes.   Patient's UOP is 0.9 ml/kg/hr 3. Is BUN < 60 mg/dL? Yes.    Patient's BUN today is 9 4. Abnormal electrolyte  K 2.9, Mg, Phos 2.2 5. Ordered repletion with: per protocol 6. If a panic level lab has been reported, has the CCM MD in charge been notified? Yes.  .   Physician:  Deter[place]  [place] 06/02/2017 6:54 AM

## 2017-06-03 ENCOUNTER — Ambulatory Visit: Payer: Managed Care, Other (non HMO)

## 2017-06-03 ENCOUNTER — Encounter (HOSPITAL_COMMUNITY): Payer: Self-pay | Admitting: Cardiology

## 2017-06-03 ENCOUNTER — Encounter (HOSPITAL_COMMUNITY)
Admission: EM | Disposition: A | Discharge status: Home / Self Care | Payer: Self-pay | Source: Home / Self Care | Attending: Interventional Cardiology

## 2017-06-03 ENCOUNTER — Encounter (HOSPITAL_COMMUNITY): Payer: Managed Care, Other (non HMO)

## 2017-06-03 DIAGNOSIS — J690 Pneumonitis due to inhalation of food and vomit: Secondary | ICD-10-CM

## 2017-06-03 DIAGNOSIS — I214 Non-ST elevation (NSTEMI) myocardial infarction: Secondary | ICD-10-CM

## 2017-06-03 DIAGNOSIS — I469 Cardiac arrest, cause unspecified: Secondary | ICD-10-CM

## 2017-06-03 HISTORY — PX: LEFT HEART CATH AND CORONARY ANGIOGRAPHY: CATH118249

## 2017-06-03 LAB — CBC
HCT: 32.9 % — ABNORMAL LOW (ref 39.0–52.0)
HCT: 31.6 % — ABNORMAL LOW (ref 39.0–52.0)
Hemoglobin: 11 g/dL — ABNORMAL LOW (ref 13.0–17.0)
Hemoglobin: 10.6 g/dL — ABNORMAL LOW (ref 13.0–17.0)
MCH: 31.1 pg (ref 26.0–34.0)
MCH: 31.1 pg (ref 26.0–34.0)
MCHC: 33.4 g/dL (ref 30.0–36.0)
MCHC: 33.5 g/dL (ref 30.0–36.0)
MCV: 92.9 fL (ref 78.0–100.0)
MCV: 92.7 fL (ref 78.0–100.0)
PLATELETS: 146 10*3/uL — AB (ref 150–400)
PLATELETS: 148 10*3/uL — AB (ref 150–400)
RBC: 3.54 MIL/uL — ABNORMAL LOW (ref 4.22–5.81)
RBC: 3.41 MIL/uL — AB (ref 4.22–5.81)
RDW: 13.8 % (ref 11.5–15.5)
RDW: 13.8 % (ref 11.5–15.5)
WBC: 14.1 10*3/uL — AB (ref 4.0–10.5)
WBC: 11.2 10*3/uL — ABNORMAL HIGH (ref 4.0–10.5)

## 2017-06-03 LAB — BASIC METABOLIC PANEL
ANION GAP: 5 (ref 5–15)
BUN: 7 mg/dL (ref 6–20)
CALCIUM: 7.5 mg/dL — AB (ref 8.9–10.3)
CHLORIDE: 110 mmol/L (ref 101–111)
CO2: 21 mmol/L — AB (ref 22–32)
Creatinine, Ser: 1 mg/dL (ref 0.61–1.24)
GFR calc Af Amer: >60 mL/min (ref >60)
GLUCOSE: 117 mg/dL — AB (ref 65–99)
POTASSIUM: 3.5 mmol/L (ref 3.5–5.1)
Sodium: 136 mmol/L (ref 135–145)

## 2017-06-03 LAB — CREATININE, SERUM
CREATININE: 0.91 mg/dL (ref 0.61–1.24)
GFR calc Af Amer: >60 mL/min (ref >60)
GFR calc non Af Amer: >60 mL/min (ref >60)

## 2017-06-03 LAB — PHOSPHORUS: Phosphorus: 2.3 mg/dL — ABNORMAL LOW (ref 2.5–4.6)

## 2017-06-03 LAB — POCT ACTIVATED CLOTTING TIME: Activated Clotting Time: 186 seconds

## 2017-06-03 LAB — HEPARIN LEVEL (UNFRACTIONATED)
Heparin Unfractionated: 0.21 IU/mL — ABNORMAL LOW (ref 0.30–0.70)
Heparin Unfractionated: 0.1 IU/mL — ABNORMAL LOW (ref 0.30–0.70)

## 2017-06-03 LAB — MAGNESIUM: Magnesium: 2.5 mg/dL — ABNORMAL HIGH (ref 1.7–2.4)

## 2017-06-03 SURGERY — LEFT HEART CATH AND CORONARY ANGIOGRAPHY
Anesthesia: LOCAL

## 2017-06-03 MED ORDER — ATORVASTATIN CALCIUM 80 MG PO TABS
80.0000 mg | ORAL_TABLET | ORAL | Status: DC
  Filled 2017-06-03: qty 1

## 2017-06-03 MED ORDER — LIDOCAINE HCL (PF) 1 % IJ SOLN
INTRAMUSCULAR | Status: AC
  Filled 2017-06-03: qty 30

## 2017-06-03 MED ORDER — HEPARIN (PORCINE) IN NACL 2-0.9 UNIT/ML-% IJ SOLN
INTRAMUSCULAR | Status: DC | PRN
  Administered 2017-06-03: 12:00:00

## 2017-06-03 MED ORDER — SODIUM CHLORIDE 0.9% FLUSH: 3.0000 mL | INTRAVENOUS | Status: DC | PRN

## 2017-06-03 MED ORDER — ENOXAPARIN SODIUM 40 MG/0.4ML ~~LOC~~ SOLN
40.0000 mg | SUBCUTANEOUS | Status: DC
  Administered 2017-06-04 – 2017-06-07 (×3): 40 mg via SUBCUTANEOUS
  Filled 2017-06-03 (×3): qty 0.4

## 2017-06-03 MED ORDER — MIDAZOLAM HCL 2 MG/2ML IJ SOLN
INTRAMUSCULAR | Status: DC | PRN
  Administered 2017-06-03: 0.5 mg via INTRAVENOUS

## 2017-06-03 MED ORDER — LIDOCAINE HCL (PF) 1 % IJ SOLN
INTRAMUSCULAR | Status: DC | PRN
  Administered 2017-06-03: 2 mL

## 2017-06-03 MED ORDER — HEPARIN (PORCINE) IN NACL 2-0.9 UNIT/ML-% IJ SOLN
INTRAMUSCULAR | Status: AC
  Filled 2017-06-03: qty 1000

## 2017-06-03 MED ORDER — MORPHINE SULFATE (PF) 2 MG/ML IV SOLN
1.0000 mg | INTRAVENOUS | Status: AC
  Administered 2017-06-03: 1 mg via INTRAVENOUS
  Filled 2017-06-03: qty 1

## 2017-06-03 MED ORDER — SODIUM CHLORIDE 0.9% FLUSH
3.0000 mL | Freq: Two times a day (BID) | INTRAVENOUS | Status: DC
  Administered 2017-06-03 – 2017-06-04 (×2): 3 mL via INTRAVENOUS

## 2017-06-03 MED ORDER — ATORVASTATIN CALCIUM 80 MG PO TABS: 80.0000 mg | ORAL_TABLET | Freq: Every day | ORAL | Status: DC

## 2017-06-03 MED ORDER — SODIUM CHLORIDE 0.9% FLUSH
3.0000 mL | Freq: Two times a day (BID) | INTRAVENOUS | Status: DC
  Administered 2017-06-03: 3 mL via INTRAVENOUS

## 2017-06-03 MED ORDER — SODIUM CHLORIDE 0.9 % IV SOLN: 250.0000 mL | INTRAVENOUS | Status: DC | PRN

## 2017-06-03 MED ORDER — AMOXICILLIN-POT CLAVULANATE 875-125 MG PO TABS
1.0000 | ORAL_TABLET | Freq: Two times a day (BID) | ORAL | Status: DC
  Administered 2017-06-03 – 2017-06-07 (×8): 1 via ORAL
  Filled 2017-06-03 (×9): qty 1

## 2017-06-03 MED ORDER — FENTANYL CITRATE (PF) 100 MCG/2ML IJ SOLN
INTRAMUSCULAR | Status: AC
  Filled 2017-06-03: qty 2

## 2017-06-03 MED ORDER — HEPARIN BOLUS VIA INFUSION
2500.0000 [IU] | Freq: Once | INTRAVENOUS | Status: AC
  Administered 2017-06-03: 2500 [IU] via INTRAVENOUS
  Filled 2017-06-03: qty 2500

## 2017-06-03 MED ORDER — IOPAMIDOL (ISOVUE-370) INJECTION 76%
INTRAVENOUS | Status: DC | PRN
  Administered 2017-06-03: 80 mL

## 2017-06-03 MED ORDER — HEPARIN (PORCINE) IN NACL 2-0.9 UNIT/ML-% IJ SOLN
INTRAMUSCULAR | Status: DC | PRN
  Administered 2017-06-03: 10 mL via INTRA_ARTERIAL

## 2017-06-03 MED ORDER — SODIUM CHLORIDE 0.9 % WEIGHT BASED INFUSION: 1.0000 mL/kg/h | INTRAVENOUS | Status: DC

## 2017-06-03 MED ORDER — VERAPAMIL HCL 2.5 MG/ML IV SOLN
INTRAVENOUS | Status: AC
  Filled 2017-06-03: qty 2

## 2017-06-03 MED ORDER — ASPIRIN 81 MG PO CHEW: 81.0000 mg | CHEWABLE_TABLET | ORAL | Status: DC

## 2017-06-03 MED ORDER — IOPAMIDOL (ISOVUE-370) INJECTION 76%
INTRAVENOUS | Status: AC
  Filled 2017-06-03: qty 100

## 2017-06-03 MED ORDER — HEPARIN SODIUM (PORCINE) 1000 UNIT/ML IJ SOLN
INTRAMUSCULAR | Status: AC
  Filled 2017-06-03: qty 1

## 2017-06-03 MED ORDER — MORPHINE SULFATE (PF) 2 MG/ML IV SOLN
1.0000 mg | Freq: Once | INTRAVENOUS | Status: AC
  Administered 2017-06-03: 1 mg via INTRAVENOUS
  Filled 2017-06-03: qty 1

## 2017-06-03 MED ORDER — SODIUM CHLORIDE 0.9 % WEIGHT BASED INFUSION
1.0000 mL/kg/h | INTRAVENOUS | Status: AC
  Administered 2017-06-03: 1 mL/kg/h via INTRAVENOUS

## 2017-06-03 MED ORDER — MIDAZOLAM HCL 2 MG/2ML IJ SOLN
INTRAMUSCULAR | Status: AC
  Filled 2017-06-03: qty 2

## 2017-06-03 MED ORDER — HEPARIN SODIUM (PORCINE) 1000 UNIT/ML IJ SOLN
INTRAMUSCULAR | Status: DC | PRN
  Administered 2017-06-03: 4500 [IU] via INTRAVENOUS

## 2017-06-03 MED ORDER — FENTANYL CITRATE (PF) 100 MCG/2ML IJ SOLN
INTRAMUSCULAR | Status: DC | PRN
  Administered 2017-06-03: 25 ug via INTRAVENOUS

## 2017-06-03 MED ORDER — IPRATROPIUM-ALBUTEROL 0.5-2.5 (3) MG/3ML IN SOLN: 3.0000 mL | RESPIRATORY_TRACT | Status: DC | PRN

## 2017-06-03 SURGICAL SUPPLY — 12 items
CATH 5FR JL3.5 JR4 ANG PIG MP (CATHETERS) ×1 IMPLANT
DEVICE RAD COMP TR BAND LRG (VASCULAR PRODUCTS) ×1 IMPLANT
ELECT DEFIB PAD ADLT CADENCE (PAD) ×1 IMPLANT
GLIDESHEATH SLEND SS 6F .021 (SHEATH) ×1 IMPLANT
GUIDEWIRE INQWIRE 1.5J.035X260 (WIRE) IMPLANT
HEMOSTASIS PAD V PLUS (HEMOSTASIS) ×1 IMPLANT
INQWIRE 1.5J .035X260CM (WIRE) ×2
KIT HEART LEFT (KITS) ×2 IMPLANT
PACK CARDIAC CATHETERIZATION (CUSTOM PROCEDURE TRAY) ×2 IMPLANT
SYR MEDRAD MARK V 150ML (SYRINGE) ×2 IMPLANT
TRANSDUCER W/STOPCOCK (MISCELLANEOUS) ×2 IMPLANT
TUBING CIL FLEX 10 FLL-RA (TUBING) ×2 IMPLANT

## 2017-06-03 NOTE — Progress Notes (Signed)
PULMONARY / CRITICAL CARE MEDICINE   Name: Rick Logan MRN: [id number] DOB: [birth year]    ADMISSION DATE:  06/01/2017  REFERRING MD: Dr. Denton Lank, ER  CHIEF COMPLAINT:  VF arrest  HISTORY OF PRESENT ILLNESS:   61 yo male smoker with witnessed arrest at work with VR.  ROSC in 20 minutes.  Intact mental status after resuscitation.  Found to have acute systolic CHF with cardiogenic shock.  Hx of HTN, HLD.  SUBJECTIVE:  No distress   VITAL SIGNS: BP 109/70   Pulse 65   Temp 98.3 F (36.8 C)   Resp 15   Ht 6\' 1"  (1.854 m)   Wt 196 lb 10.4 oz (89.2 kg)   SpO2 97%   BMI 25.94 kg/m   HEMODYNAMICS: CVP:  [7 mmHg-29 mmHg] 8 mmHg  VENTILATOR SETTINGS:    INTAKE / OUTPUT: I/O last 3 completed shifts: In: 5570.1 [P.O.:250; I.V.:4516.8; IV Piggyback:803.3] Out: 1900 [Urine:1900]  PHYSICAL EXAMINATION:  General appearance:  61 Year old  Male, well nourished  NADEyes: anicteric sclerae , moist conjunctivae; PERRL, EOMI bilaterally. Mouth:  membranes and no mucosal ulcerations; normal hard and soft palate Neck: Trachea midline; neck supple, no JVD Lungs/chest: Crackles both bases, with normal respiratory effort and no intercostal retractions CV: RRR, no MRGs  Abdomen: Soft, non-tender; no masses or HSM Extremities: No peripheral edema or extremity lymphadenopathy Skin: Normal temperature, turgor and texture; no rash, ulcers or subcutaneous nodules Psych: Appropriate affect, alert and oriented to person, place and time    LABS:  BMET  Recent Labs Lab 06/02/17 0307 06/02/17 2026 06/03/17 0327  NA 138 136 136  K 2.9* 3.5 3.5  CL 114* 109 110  CO2 16* 20* 21*  BUN 9 6 7   CREATININE 0.90 1.16 1.00  GLUCOSE 122* 130* 117*    Electrolytes  Recent Labs Lab 06/01/17 1325  06/02/17 0307 06/02/17 2026 06/03/17 0327  CALCIUM  --   < > 6.9* 7.3* 7.5*  MG  --   --  1.5* 2.7* 2.5*  PHOS 3.0  --  2.2*  --  2.3*  < > = values in this interval not  displayed.  CBC  Recent Labs Lab 06/01/17 1538 06/02/17 0307 06/03/17 0327  WBC 16.6* 17.2* 14.1*  HGB 13.8 12.7* 11.0*  HCT 40.8 36.6* 32.9*  PLT 255 206 146*    Coag's  Recent Labs Lab 06/01/17 0742  INR 1.37    Sepsis Markers  Recent Labs Lab 06/01/17 1325  LATICACIDVEN 3.0*  PROCALCITON 0.33    ABG  Recent Labs Lab 06/01/17 1004 06/01/17 1412 06/01/17 2340  PHART 7.263* 7.246* 7.482*  PCO2ART 36.4 41.3 23.2*  PO2ART 225.0* 96.9 134*    Liver Enzymes  Recent Labs Lab 06/01/17 0742  AST 210*  ALT 220*  ALKPHOS 83  BILITOT 0.8  ALBUMIN 3.3*    Cardiac Enzymes  Recent Labs Lab 06/01/17 1538 06/02/17 0307  TROPONINI 1.34* 21.97*    Glucose  Recent Labs Lab 06/01/17 1549 06/01/17 1948 06/01/17 2315 06/02/17 0352 06/02/17 0740 06/02/17 1143  GLUCAP 165* 93 126* 122* 115* 127*    Imaging No results found.   STUDIES:  ECHO 9/5>>>EF 30 to 35%, grade 1 DD  CULTURES: Blood 9/05: 1/2 GPC chains/pairs Sputum 9/05 >>  ANTIBIOTICS: Vancomycin 9/05 >>9/7 Zosyn 9/05 >>  SIGNIFICANT EVENTS: 9/05 Admit, cardiology consult, fever 101.5  LINES/TUBES: ETT 9/05 >> 9/06 Lt IJ CVL 9/05 >> A line 9/05 >>out   DISCUSSION: 61 yo male smoker  with VF arrest, acute systolic CHF with cardiogenic shock, fever. -looks good -awaiting for cardiac cath  -narrow abx   ASSESSMENT / PLAN:  Acute hypoxic respiratory failure. Tobacco abuse with presumed COPD. Aspiration pneumonia. ->wbc ct improving; no fever spike.  ->pcxr 9/6: worsening  Day 3 abx; narrow to augmentin  Repeat cxr in am  Wean oxygen   VF cardiac arrest. NSTEMI. Acute systolic CHF with cardiogenic shock. Hx of HTN, HLD. ->cards following Asa, lipitor and plavix Heparin, and amio per cards For left heart cath today   Hyperglycemia. ssi   DVT prophylaxis - [place] heparin  SUP - PPI  Nutrition - heart healthy  Goals of care - full code  Resolved issues Lactic  acidosis  Fluid and electrolyte imbalance  After 3pm call: [phone]

## 2017-06-03 NOTE — Progress Notes (Signed)
Progress Note  Patient Name: Rick Logan Date of Encounter: 06/03/2017  Primary Cardiologist: Mendel Ryder  Subjective   Awake and extubated. Daughters at bedside. Does not remember much about what was going on prior to event.  Inpatient Medications    Scheduled Meds: . aspirin  81 mg Oral Daily  . aspirin  81 mg Oral Pre-Cath  . atorvastatin  80 mg Oral NOW   Followed by  . [START ON 06/04/2017] atorvastatin  80 mg Oral Q0600  . clopidogrel  75 mg Oral Daily  . ipratropium-albuterol  3 mL Nebulization Q6H  . mouth rinse  15 mL Mouth Rinse BID  . pantoprazole (PROTONIX) IV  40 mg Intravenous QHS  . sodium chloride flush  3 mL Intravenous Q12H   Continuous Infusions: . sodium chloride    . sodium chloride 10 mL/hr at 06/02/17 0922  . sodium chloride    . sodium chloride    . amiodarone 30 mg/hr (06/02/17 1000)  . heparin 1,650 Units/hr (06/03/17 0920)  . norepinephrine (LEVOPHED) Adult infusion Stopped (06/03/17 0700)  . piperacillin-tazobactam (ZOSYN)  IV Stopped (06/03/17 0727)  . propofol (DIPRIVAN) infusion Stopped (06/02/17 0800)  . vancomycin Stopped (06/03/17 0010)   PRN Meds: Place/Maintain arterial line **AND** sodium chloride, sodium chloride, ibuprofen, sodium chloride flush   Vital Signs    Vitals:   06/03/17 0615 06/03/17 0645 06/03/17 0700 06/03/17 0747  BP: (!) 96/59 118/67 109/70   Pulse: (!) 56 63 65   Resp: 19 (!) 22 15   Temp:      TempSrc:      SpO2: 98% 97% 98% 97%  Weight:      Height:        Intake/Output Summary (Last 24 hours) at 06/03/17 1044 Last data filed at 06/03/17 0900  Gross per 24 hour  Intake          1986.05 ml  Output             1200 ml  Net           786.05 ml   Filed Weights   06/01/17 0815 06/02/17 0208 06/03/17 0206  Weight: 176 lb 5.9 oz (80 kg) 187 lb 6.3 oz (85 kg) 196 lb 10.4 oz (89.2 kg)    Telemetry    NSR with no significant ectopy - Personally Reviewed  ECG    No new tracing available today -  Personally Reviewed  Physical Exam  Awake, engaging, and mild MS chest pain as only concern. GEN: No acute distress.   Neck: No JVD Cardiac: RRR, no murmurs, rubs, or gallops. Sternum is sore to palpation. Respiratory: Clear to auscultation bilaterally. GI: Soft, nontender, non-distended  MS: No edema; No deformity. Neuro:  Nonfocal with normal mentation and speech. Psych: Anxious  Labs    Chemistry Recent Labs Lab 06/01/17 [phone]  06/02/17 0307 06/02/17 2026 06/03/17 0327  NA 138  < > 138 136 136  K 3.9  < > 2.9* 3.5 3.5  CL 104  < > 114* 109 110  CO2 9*  < > 16* 20* 21*  GLUCOSE 438*  < > 122* 130* 117*  BUN 7  < > CREATININE 1.64*  < > 0.90 1.16 1.00  CALCIUM 8.8*  < > 6.9* 7.3* 7.5*  PROT 5.7*  --   --   --   --   ALBUMIN 3.3*  --   --   --   --   AST  210*  --   --   --   --   ALT 220*  --   --   --   --   ALKPHOS 83  --   --   --   --   BILITOT 0.8  --   --   --   --   GFRNONAA 43*  < > >60 >60 >60  GFRAA 50*  < > >60 >60 >60  ANIONGAP 25*  < > 8 7 5   < > = values in this interval not displayed.   Hematology Recent Labs Lab 06/01/17 1538 06/02/17 0307 06/03/17 0327  WBC 16.6* 17.2* 14.1*  RBC 4.36 4.03* 3.54*  HGB 13.8 12.7* 11.0*  HCT 40.8 36.6* 32.9*  MCV 93.6 90.8 92.9  MCH 31.7 31.5 31.1  MCHC 33.8 34.7 33.4  RDW 13.6 13.7 13.8  PLT 255 206 146*    Cardiac Enzymes Recent Labs Lab 06/01/17 1538 06/02/17 0307  TROPONINI 1.34* 21.97*    Recent Labs Lab 06/01/17 0757 06/01/17 1041  TROPIPOC 0.03 0.65*     BNP Recent Labs Lab 06/01/17 1326  BNP 22.6     DDimer No results for input(s): DDIMER in the last 168 hours.   Radiology    Dg Chest Port 1 View  Result Date: 06/02/2017 CLINICAL DATA:  Epigastric pain.  Patient status post CPR yesterday. EXAM: PORTABLE CHEST 1 VIEW COMPARISON:  Single-view of the chest 06/02/2017 and 06/01/2017. FINDINGS: The patient's endotracheal tube and NG tube have been removed. Left IJ catheter  remains in place. Mild bibasilar atelectasis is seen. No pneumothorax or pleural effusion. Heart size is normal. No acute bony abnormality is identified. IMPRESSION: No acute disease. Bibasilar subsegmental atelectasis. Status post extubation and NG tube removal. Electronically Signed   By: Drusilla Kanner M.D.   On: 06/02/2017 10:25   Dg Chest Port 1 View  Result Date: 06/02/2017 CLINICAL DATA:  Advancement of the nasogastric tube by 10 cm is recommended to assure that the proximal port is positioned below the GE junction. EXAM: PORTABLE CHEST 1 VIEW COMPARISON:  Portable chest x-ray of June 01, 2017 FINDINGS: There is increased density at both bases greatest on the left. There is no pleural effusion or pneumothorax. The heart and pulmonary vascularity are normal. The endotracheal tube tip projects approximately 4.4 cm above the carina. The esophagogastric tube has its proximal port at the level of the GE junction with the tip in the cardia. There is calcification in the wall of the aortic arch. The left internal jugular venous catheter tip projects over the proximal SVC. IMPRESSION: Interval development of bibasilar atelectasis or pneumonia greatest on the left. Advancement of the nasogastric tube by 5-10 cm is recommended to assure that the proximal port is positioned below the GE junction. Electronically Signed   By: David  [place] M.D.   On: 06/02/2017 07:16   Dg Chest Port 1 View  Result Date: 06/01/2017 CLINICAL DATA:  Central line placement EXAM: PORTABLE CHEST 1 VIEW COMPARISON:  06/01/2017 FINDINGS: Left internal jugular central line is in place with the tip at the confluence of the innominate veins. Endotracheal tube and NG tube are unchanged. No pneumothorax. Heart is normal size. No confluent airspace opacities or effusions. IMPRESSION: Left central line tip at the confluence of the innominate veins. No pneumothorax. No acute findings. Electronically Signed   By: Charlett Nose M.D.   On:  06/01/2017 11:57    Cardiac Studies   2-D Doppler echocardiogram 06/01/2017: Study  Conclusions  - Left ventricle: The cavity size was normal. Wall thickness was normal. Systolic function was moderately to severely reduced. The estimated ejection fraction was in the range of 30% to 35%. There is akinesis of the mid-apicalanteroseptal and apical myocardium. Doppler parameters are consistent with abnormal left ventricular relaxation (grade 1 diastolic dysfunction).  Impressions:  - Technically difficult; definity used; akinesis of the distal anteroseptal and apical walls with overall moderate to severe LV dysfunction; mild diastolic dysfunction; trace TR.  Patient Profile     61 y.o. male with a hx of hypertension, hyperlipidemia, PAD with prior LLE intervention by Dr. Allyson Sabal, and tobacco usewho suffered ventricular fibrillation and was successfully resuscitated.Severe acidosis and hyperglycemia have been treated. Kidney function is norrmal, echo shows regional WMA, ECG abnormal, and enzymes c/w NSTEMI.  Assessment & Plan    1. Ventricular fibrillation cardiac arrest almost certainly ischemically mediated. S/P successful resusc and with normal neurological function. 2. NSTEMI with echo evidence of anterior wall motion abnormality. 3. Hypokalemia has been treated. 4. Hypoxic respiratory failure has resolved.  The patient was counseled to undergo left heart catheterization, coronary angiography, and possible percutaneous coronary intervention with stent implantation. The procedural risks and benefits were discussed in detail. The risks discussed included death, stroke, myocardial infarction, life-threatening bleeding, limb ischemia, kidney injury, allergy, and possible emergency cardiac surgery. The risk of these significant complications were estimated to occur less than 1% of the time. After discussion, the patient has agreed to proceed.  Signed, Lesleigh Noe, MD    06/03/2017, 10:44 AM

## 2017-06-03 NOTE — Progress Notes (Signed)
ANTICOAGULATION CONSULT NOTE - Initial Consult  Pharmacy Consult for heparin Indication: chest pain/ACS  No Known Allergies  Patient Measurements: Height: 6\' 1"  (185.4 cm) Weight: 196 lb 10.4 oz (89.2 kg) IBW/kg (Calculated) : 79.9 Heparin Dosing Weight: 80 kg  Vital Signs: Temp: 98.3 F (36.8 C) (09/07 0345) Temp Source: Oral (09/07 0001) BP: 109/70 (09/07 0700) Pulse Rate: 65 (09/07 0700)  Labs:  Recent Labs  06/01/17 0742  06/01/17 1538  06/02/17 0307 06/02/17 1705 06/02/17 2026 06/03/17 0100 06/03/17 0327 06/03/17 0810  HGB 15.3  < > 13.8  --  12.7*  --   --   --  11.0*  --   HCT 48.5  < > 40.8  --  36.6*  --   --   --  32.9*  --   PLT 234  --  255  --  206  --   --   --  146*  --   LABPROT 16.8*  --   --   --   --   --   --   --   --   --   INR 1.37  --   --   --   --   --   --   --   --   --   HEPARINUNFRC  --   --   --   --   --  0.12*  --  0.21*  --  0.10*  CREATININE 1.64*  < >  --   < > 0.90  --  1.16  --  1.00  --   TROPONINI  --   --  1.34*  --  21.97*  --   --   --   --   --   < > = values in this interval not displayed.  Estimated Creatinine Clearance: 85.4 mL/min (by C-G formula based on SCr of 1 mg/dL).  Assessment: CC/HPI: 61 yo male admitted after having a witnessed syncopal event at work > EMS found him in VF > shock x 5 > CPR ~20 min  PMH: HLD, HTN, Smoker  Anticoag: none pta; iv hep for ACS  CV: vfib arrest PTA; possible ACS - trop 21.97 VSS on amio gtt levo @ 20  Pending cath today @ 1500  Hep lvl low this am at 0.1 - no issues per RN  Goal of Therapy:  Heparin level 0.3-0.7 units/ml Monitor platelets by anticoagulation protocol: Yes   Plan:  Hep bolus 2500 units x 1 Heparin gtt 1650 units/hr Next lvl 1700 Daily HL, CBC F/U Cards plans for cath - scheduled for 3p  Isaac Bliss, PharmD, BCPS, BCCCP Clinical Pharmacist Clinical phone for 06/03/2017 from 7a-3:30p: [phone] If after 3:30p, please call main pharmacy at:  [phone] 06/03/2017 8:55 AM

## 2017-06-03 NOTE — H&P (View-Only) (Signed)
Progress Note  Patient Name: Rick Logan Date of Encounter: 06/03/2017  Primary Cardiologist: Mendel Ryder  Subjective   Awake and extubated. Daughters at bedside. Does not remember much about what was going on prior to event.  Inpatient Medications    Scheduled Meds: . aspirin  81 mg Oral Daily  . aspirin  81 mg Oral Pre-Cath  . atorvastatin  80 mg Oral NOW   Followed by  . [START ON 06/04/2017] atorvastatin  80 mg Oral Q0600  . clopidogrel  75 mg Oral Daily  . ipratropium-albuterol  3 mL Nebulization Q6H  . mouth rinse  15 mL Mouth Rinse BID  . pantoprazole (PROTONIX) IV  40 mg Intravenous QHS  . sodium chloride flush  3 mL Intravenous Q12H   Continuous Infusions: . sodium chloride    . sodium chloride 10 mL/hr at 06/02/17 0922  . sodium chloride    . sodium chloride    . amiodarone 30 mg/hr (06/02/17 1000)  . heparin 1,650 Units/hr (06/03/17 0920)  . norepinephrine (LEVOPHED) Adult infusion Stopped (06/03/17 0700)  . piperacillin-tazobactam (ZOSYN)  IV Stopped (06/03/17 0727)  . propofol (DIPRIVAN) infusion Stopped (06/02/17 0800)  . vancomycin Stopped (06/03/17 0010)   PRN Meds: Place/Maintain arterial line **AND** sodium chloride, sodium chloride, ibuprofen, sodium chloride flush   Vital Signs    Vitals:   06/03/17 0615 06/03/17 0645 06/03/17 0700 06/03/17 0747  BP: (!) 96/59 118/67 109/70   Pulse: (!) 56 63 65   Resp: 19 (!) 22 15   Temp:      TempSrc:      SpO2: 98% 97% 98% 97%  Weight:      Height:        Intake/Output Summary (Last 24 hours) at 06/03/17 1044 Last data filed at 06/03/17 0900  Gross per 24 hour  Intake          1986.05 ml  Output             1200 ml  Net           786.05 ml   Filed Weights   06/01/17 0815 06/02/17 0208 06/03/17 0206  Weight: 176 lb 5.9 oz (80 kg) 187 lb 6.3 oz (85 kg) 196 lb 10.4 oz (89.2 kg)    Telemetry    NSR with no significant ectopy - Personally Reviewed  ECG    No new tracing available today -  Personally Reviewed  Physical Exam  Awake, engaging, and mild MS chest pain as only concern. GEN: No acute distress.   Neck: No JVD Cardiac: RRR, no murmurs, rubs, or gallops. Sternum is sore to palpation. Respiratory: Clear to auscultation bilaterally. GI: Soft, nontender, non-distended  MS: No edema; No deformity. Neuro:  Nonfocal with normal mentation and speech. Psych: Anxious  Labs    Chemistry Recent Labs Lab 06/01/17 [phone]  06/02/17 0307 06/02/17 2026 06/03/17 0327  NA 138  < > 138 136 136  K 3.9  < > 2.9* 3.5 3.5  CL 104  < > 114* 109 110  CO2 9*  < > 16* 20* 21*  GLUCOSE 438*  < > 122* 130* 117*  BUN 7  < > CREATININE 1.64*  < > 0.90 1.16 1.00  CALCIUM 8.8*  < > 6.9* 7.3* 7.5*  PROT 5.7*  --   --   --   --   ALBUMIN 3.3*  --   --   --   --   AST  210*  --   --   --   --   ALT 220*  --   --   --   --   ALKPHOS 83  --   --   --   --   BILITOT 0.8  --   --   --   --   GFRNONAA 43*  < > >60 >60 >60  GFRAA 50*  < > >60 >60 >60  ANIONGAP 25*  < > 8 7 5   < > = values in this interval not displayed.   Hematology Recent Labs Lab 06/01/17 1538 06/02/17 0307 06/03/17 0327  WBC 16.6* 17.2* 14.1*  RBC 4.36 4.03* 3.54*  HGB 13.8 12.7* 11.0*  HCT 40.8 36.6* 32.9*  MCV 93.6 90.8 92.9  MCH 31.7 31.5 31.1  MCHC 33.8 34.7 33.4  RDW 13.6 13.7 13.8  PLT 255 206 146*    Cardiac Enzymes Recent Labs Lab 06/01/17 1538 06/02/17 0307  TROPONINI 1.34* 21.97*    Recent Labs Lab 06/01/17 0757 06/01/17 1041  TROPIPOC 0.03 0.65*     BNP Recent Labs Lab 06/01/17 1326  BNP 22.6     DDimer No results for input(s): DDIMER in the last 168 hours.   Radiology    Dg Chest Port 1 View  Result Date: 06/02/2017 CLINICAL DATA:  Epigastric pain.  Patient status post CPR yesterday. EXAM: PORTABLE CHEST 1 VIEW COMPARISON:  Single-view of the chest 06/02/2017 and 06/01/2017. FINDINGS: The patient's endotracheal tube and NG tube have been removed. Left IJ catheter  remains in place. Mild bibasilar atelectasis is seen. No pneumothorax or pleural effusion. Heart size is normal. No acute bony abnormality is identified. IMPRESSION: No acute disease. Bibasilar subsegmental atelectasis. Status post extubation and NG tube removal. Electronically Signed   By: Drusilla Kanner M.D.   On: 06/02/2017 10:25   Dg Chest Port 1 View  Result Date: 06/02/2017 CLINICAL DATA:  Advancement of the nasogastric tube by 10 cm is recommended to assure that the proximal port is positioned below the GE junction. EXAM: PORTABLE CHEST 1 VIEW COMPARISON:  Portable chest x-ray of June 01, 2017 FINDINGS: There is increased density at both bases greatest on the left. There is no pleural effusion or pneumothorax. The heart and pulmonary vascularity are normal. The endotracheal tube tip projects approximately 4.4 cm above the carina. The esophagogastric tube has its proximal port at the level of the GE junction with the tip in the cardia. There is calcification in the wall of the aortic arch. The left internal jugular venous catheter tip projects over the proximal SVC. IMPRESSION: Interval development of bibasilar atelectasis or pneumonia greatest on the left. Advancement of the nasogastric tube by 5-10 cm is recommended to assure that the proximal port is positioned below the GE junction. Electronically Signed   By: David  [place]il[place] M.D.   On: 06/02/2017 07:16   Dg Chest Port 1 View  Result Date: 06/01/2017 CLINICAL DATA:  Central line placement EXAM: PORTABLE CHEST 1 VIEW COMPARISON:  06/01/2017 FINDINGS: Left internal jugular central line is in place with the tip at the confluence of the innominate veins. Endotracheal tube and NG tube are unchanged. No pneumothorax. Heart is normal size. No confluent airspace opacities or effusions. IMPRESSION: Left central line tip at the confluence of the innominate veins. No pneumothorax. No acute findings. Electronically Signed   By: Charlett Nose M.D.   On:  06/01/2017 11:57    Cardiac Studies   2-D Doppler echocardiogram 06/01/2017: Study  Conclusions  - Left ventricle: The cavity size was normal. Wall thickness was normal. Systolic function was moderately to severely reduced. The estimated ejection fraction was in the range of 30% to 35%. There is akinesis of the mid-apicalanteroseptal and apical myocardium. Doppler parameters are consistent with abnormal left ventricular relaxation (grade 1 diastolic dysfunction).  Impressions:  - Technically difficult; definity used; akinesis of the distal anteroseptal and apical walls with overall moderate to severe LV dysfunction; mild diastolic dysfunction; trace TR.  Patient Profile     61 y.o. male with a hx of hypertension, hyperlipidemia, PAD with prior LLE intervention by Dr. Berry, and tobacco usewho suffered ventricular fibrillation and was successfully resuscitated.Severe acidosis and hyperglycemia have been treated. Kidney function is norrmal, echo shows regional WMA, ECG abnormal, and enzymes c/w NSTEMI.  Assessment & Plan    1. Ventricular fibrillation cardiac arrest almost certainly ischemically mediated. S/P successful resusc and with normal neurological function. 2. NSTEMI with echo evidence of anterior wall motion abnormality. 3. Hypokalemia has been treated. 4. Hypoxic respiratory failure has resolved.  The patient was counseled to undergo left heart catheterization, coronary angiography, and possible percutaneous coronary intervention with stent implantation. The procedural risks and benefits were discussed in detail. The risks discussed included death, stroke, myocardial infarction, life-threatening bleeding, limb ischemia, kidney injury, allergy, and possible emergency cardiac surgery. The risk of these significant complications were estimated to occur less than 1% of the time. After discussion, the patient has agreed to proceed.  Signed, Henry W Smith III, MD    06/03/2017, 10:44 AM   

## 2017-06-03 NOTE — Consult Note (Signed)
ELECTROPHYSIOLOGY CONSULT NOTE  Patient ID: Rick Logan, MRN: [id number], DOB/AGE: [birth year] 61 y.o. Admit date: 06/24/2017 Date of Consult: 06/03/2017  Primary Physician: Delorse Lek, MD Primary Cardiologist: [facility] Rick Logan is a 61 y.o. male who is being seen today for the evaluation of aaborted cardiac arrest at the request of Dr Katrinka Blazing.   Chief Complaint: Aborted cardiac arrest   HPI Rick Logan is a 61 y.o. male with known peripheral vascular disease ventricular ectopy but no prior history of syncope or family history of premature death.  He arrested 06-24-17.  He was at work smoking a cigarette. He made a comment to somebody that there was an issue and then collapsed. EMS was called. Ventricular fibrillation was noted. He was shocked multiple times. It took about 20 minutes for return of spontaneous circulation. By the time he arrived in the emergency room he was able to follow commands.  He has had recent episodes of presyncope. These have occurred over the last couple of weeks. They have been independent of position and it had a duration of less than 15-20 seconds. They're not associated with palpitations.  He has noted no change in exercise tolerance.  Echocardiogram suggested an anterolateral apical wall motion abnormality. Catheterization today demonstrated no obstructive disease and the possibility of TAKO TSUBO was raised.  Past Medical History:  Diagnosis Date  . Hyperlipidemia   . Hypertension       Surgical History:  Past Surgical History:  Procedure Laterality Date  . ABDOMINAL SURGERY     No details available  . LEFT HEART CATH AND CORONARY ANGIOGRAPHY N/A 06/03/2017   Procedure: LEFT HEART CATH AND CORONARY ANGIOGRAPHY;  Surgeon: [place], Peter M, MD;  Location: [facility] INVASIVE CV LAB;  Service: Cardiovascular;  Laterality: N/A;     Home Meds: Prior to Admission medications   Medication Sig Start Date End Date Taking? Authorizing Provider    aspirin EC 81 MG tablet Take 81 mg by mouth daily.   Yes [provider]  atorvastatin (LIPITOR) 40 MG tablet Take 40 mg by mouth daily.   Yes [provider]  benazepril (LOTENSIN) 40 MG tablet Take 40 mg by mouth daily.   Yes [provider]  clopidogrel (PLAVIX) 75 MG tablet Take 75 mg by mouth daily.   Yes [provider]    Inpatient Medications:  . amoxicillin-clavulanate  1 tablet Oral Q12H  . aspirin  81 mg Oral Daily  . clopidogrel  75 mg Oral Daily  . [START ON 06/04/2017] enoxaparin (LOVENOX) injection  40 mg Subcutaneous Q24H  . mouth rinse  15 mL Mouth Rinse BID  . sodium chloride flush  3 mL Intravenous Q12H      Allergies: No Known Allergies  Social History   Social History  . Marital status: Married    Spouse name: N/A  . Number of children: N/A  . Years of education: N/A   Occupational History  . Not on file.   Social History Main Topics  . Smoking status: Current Every Day Smoker  . Smokeless tobacco: Never Used  . Alcohol use No  . Drug use: No  . Sexual activity: Not Currently   Other Topics Concern  . Not on file   Social History Narrative  . No narrative on file     History reviewed. No pertinent family history.   ROS:  Please see the history of present illness.     All other systems reviewed and negative.  Physical Exam: Blood pressure 132/71, pulse 73, temperature 99 F (37.2 C), temperature source Oral, resp. rate (!) 35, height  (1.854 m), weight 196 lb 10.4 oz (89.2 kg), SpO2 (!) 89 %. General: Well developed, well nourished male in no acute distress. Head: Normocephalic, atraumatic, sclera non-icteric, no xanthomas, nares are without discharge. EENT: normal Lymph Nodes:  none Back: without scoliosis/kyphosis, no CVA tendersness Neck: Negative for carotid bruits. JVD not elevated. Lungs: Clear bilaterally to auscultation without wheezes, rales, or rhonchi. Breathing is unlabored. Heart: RRR  with S1 S2. No murmur , rubs, or gallops appreciated. Abdomen: Soft, non-tender, non-distended with normoactive bowel sounds. No hepatomegaly. No rebound/guarding. No obvious abdominal masses. Msk:  Strength and tone appear normal for age. Extremities: No clubbing or cyanosis. No edema.  Distal pedal pulses are 2+ and equal bilaterally. Skin: Warm and Dry Neuro: Alert and oriented X 3. CN III-XII intact Grossly normal sensory and motor function . Psych:  Responds to questions appropriately with a normal affect.      Labs: Cardiac Enzymes  Recent Labs  06/01/17 1538 06/02/17 0307  TROPONINI 1.34* 21.97*   CBC Lab Results  Component Value Date   WBC 11.2 (H) 06/03/2017   HGB 10.6 (L) 06/03/2017   HCT 31.6 (L) 06/03/2017   MCV 92.7 06/03/2017   PLT 148 (L) 06/03/2017   PROTIME:  Recent Labs  06/01/17 0742  LABPROT 16.8*  INR 1.37   Chemistry  Recent Labs Lab 06/01/17 0742  06/03/17 0327 06/03/17 1408  NA 138  < > 136  --   K 3.9  < > 3.5  --   CL 104  < > 110  --   CO2 9*  < > 21*  --   BUN 7  < > 7  --   CREATININE 1.64*  < > 1.00 0.91  CALCIUM 8.8*  < > 7.5*  --   PROT 5.7*  --   --   --   BILITOT 0.8  --   --   --   ALKPHOS 83  --   --   --   ALT 220*  --   --   --   AST 210*  --   --   --   GLUCOSE 438*  < > 117*  --   < > = values in this interval not displayed. Lipids Lab Results  Component Value Date   TRIG 151 (H) 06/02/2017   BNP No results found for: PROBNP Thyroid Function Tests: No results for input(s): TSH, T4TOTAL, T3FREE, THYROIDAB in the last 72 hours.  Invalid input(s): FREET3    Miscellaneous No results found for: DDIMER  Radiology/Studies:  Dg Chest Port 1 View  Result Date: 06/02/2017 CLINICAL DATA:  Epigastric pain.  Patient status post CPR yesterday. EXAM: PORTABLE CHEST 1 VIEW COMPARISON:  Single-view of the chest 06/02/2017 and 06/01/2017. FINDINGS: The patient's endotracheal tube and NG tube have been removed. Left IJ  catheter remains in place. Mild bibasilar atelectasis is seen. No pneumothorax or pleural effusion. Heart size is normal. No acute bony abnormality is identified. IMPRESSION: No acute disease. Bibasilar subsegmental atelectasis. Status post extubation and NG tube removal. Electronically Signed   By: Drusilla Kanner M.D.   On: 06/02/2017 10:25   Dg Chest Port 1 View  Result Date: 06/02/2017 CLINICAL DATA:  Advancement of the nasogastric tube by 10 cm is recommended to assure that the proximal port is positioned below the GE junction. EXAM: PORTABLE CHEST  1 VIEW COMPARISON:  Portable chest x-ray of June 01, 2017 FINDINGS: There is increased density at both bases greatest on the left. There is no pleural effusion or pneumothorax. The heart and pulmonary vascularity are normal. The endotracheal tube tip projects approximately 4.4 cm above the carina. The esophagogastric tube has its proximal port at the level of the GE junction with the tip in the cardia. There is calcification in the wall of the aortic arch. The left internal jugular venous catheter tip projects over the proximal SVC. IMPRESSION: Interval development of bibasilar atelectasis or pneumonia greatest on the left. Advancement of the nasogastric tube by 5-10 cm is recommended to assure that the proximal port is positioned below the GE junction. Electronically Signed   By: David  [place] M.D.   On: 06/02/2017 07:16   Dg Chest Port 1 View  Result Date: 06/01/2017 CLINICAL DATA:  Central line placement EXAM: PORTABLE CHEST 1 VIEW COMPARISON:  06/01/2017 FINDINGS: Left internal jugular central line is in place with the tip at the confluence of the innominate veins. Endotracheal tube and NG tube are unchanged. No pneumothorax. Heart is normal size. No confluent airspace opacities or effusions. IMPRESSION: Left central line tip at the confluence of the innominate veins. No pneumothorax. No acute findings. Electronically Signed   By: Charlett Nose M.D.    On: 06/01/2017 11:57   Dg Chest Port 1 View  Result Date: 06/01/2017 CLINICAL DATA:  Cardiac arrest.  Endotracheal tube placement. EXAM: PORTABLE CHEST 1 VIEW COMPARISON:  None. FINDINGS: Endotracheal tube is in good position 5 cm above the carina. NG tube tip is below the diaphragm. Heart size and pulmonary vascularity are normal.  Lungs are clear. IMPRESSION: Tubes in good position.  Clear lungs. Electronically Signed   By: Francene Boyers M.D.   On: 06/01/2017 09:21    EKG:  ECGs were reviewed back to 2016 intervals at that time were normal. (The patient has 2 medical record number as)  ECG on arrival at [facility] on [facility]/5 was notable for normal intervals. ECG 9/6 demonstrated QT prolongation in the context of amiodarone therapy   Assessment and Plan:  Aborted cardiac arrest  Nonischemic cardiomyopathy ? Tako Tsubo  Peripheral vascular disease with prior intervention  Low-voltage limb leads   The patient has an unexplained nonischemic cardiomyopathy. Wall motion abnormalities have raised the possibility of both an ischemic event or Tako Tsubo and with the elevated troponin, a spasm event or a spontaneous recanalized event are possibilities. However, in the absence of the ECG abnormalities and findings of coronary artery obstructive disease is hard to be confident in this attribution; hence, I think it more appropriately classified as secondary ventricular fibrillation in the context of his cardiomyopathy. In this regard it is noteworthy that his ECG in 2016 was abnormal with low voltage and Q waves  suggesting that his cardiomyopathy antedates this event.  Low-voltage raises the possibility of an infiltrative process although left ventricular wall thicknesses are described as normal  Recommend cardiac MRI followed by ICD implantation for secondary prevention.  We'll anticipate these on Monday.  We'll discontinue amiodarone  Ambulate as possible  No driving 6 months    Sherryl Manges

## 2017-06-03 NOTE — Progress Notes (Signed)
ANTICOAGULATION CONSULT NOTE - F/u Consult  Pharmacy Consult for heparin Indication: chest pain/ACS  No Known Allergies  Patient Measurements: Height: 6\' 1"  (185.4 cm) Weight: 187 lb 6.3 oz (85 kg) IBW/kg (Calculated) : 79.9 Heparin Dosing Weight: 80 kg  Assessment: CC/HPI: 61 yo male admitted after having a witnessed syncopal event at work > EMS found him in VF > shock x 5 > CPR ~20 min. On heparin for ACS.  Heparin level subtherapeutic at 0.21 with no issues per RN. CBC stable and no bleeding noted.  Goal of Therapy:  Heparin level 0.3-0.7 units/ml Monitor platelets by anticoagulation protocol: Yes   Plan:  Increase heparin gtt to 1350 units/hr  Heparin level in 6 hrs Daily heparin level and CBC Monitor for s/s bleeding   York Cerise, PharmD Clinical Pharmacist 06/03/17 2:02 AM

## 2017-06-03 NOTE — Care Management Note (Signed)
Case Management Note  Patient Details  Name: Rick Logan MRN: [id number] Date of Birth: [birth year]  Subjective/Objective:   Pt admitted with Cardiac Arrest                 Action/Plan:  PTA independent from home.  Pt alert and oriented.  CM will continue to follow for discharge needs   Expected Discharge Date:                  Expected Discharge Plan:  Home/Self Care  In-House Referral:     Discharge planning Services  CM Consult  Post Acute Care Choice:    Choice offered to:     DME Arranged:    DME Agency:     HH Arranged:    HH Agency:     Status of Service:     If discussed at [employer] of Stay Meetings, dates discussed:    Additional Comments:  Cherylann Parr, RN 06/03/2017, 2:14 PM

## 2017-06-03 NOTE — Interval H&P Note (Signed)
History and Physical Interval Note:  06/03/2017 10:59 AM  Rick Logan  has presented today for surgery, with the diagnosis of chest pain  The various methods of treatment have been discussed with the patient and family. After consideration of risks, benefits and other options for treatment, the patient has consented to  Procedure(s): LEFT HEART CATH AND CORONARY ANGIOGRAPHY (N/A) as a surgical intervention .  The patient's history has been reviewed, patient examined, no change in status, stable for surgery.  I have reviewed the patient's chart and labs.  Questions were answered to the patient's satisfaction.    Cath Lab Visit (complete for each Cath Lab visit)  Clinical Evaluation Leading to the Procedure:   ACS: Yes.    Non-ACS:    Anginal Classification: CCS IV  Anti-ischemic medical therapy: No Therapy  Non-Invasive Test Results: No non-invasive testing performed  Prior CABG: No previous CABG       [name] [facility] 06/03/2017 10:59 AM

## 2017-06-04 LAB — CULTURE, BLOOD (ROUTINE X 2): SPECIAL REQUESTS: ADEQUATE

## 2017-06-04 LAB — CBC
HCT: 29.6 % — ABNORMAL LOW (ref 39.0–52.0)
Hemoglobin: 9.9 g/dL — ABNORMAL LOW (ref 13.0–17.0)
MCH: 30.9 pg (ref 26.0–34.0)
MCHC: 33.4 g/dL (ref 30.0–36.0)
MCV: 92.5 fL (ref 78.0–100.0)
PLATELETS: 135 10*3/uL — AB (ref 150–400)
RBC: 3.2 MIL/uL — AB (ref 4.22–5.81)
RDW: 13.7 % (ref 11.5–15.5)
WBC: 10.6 10*3/uL — AB (ref 4.0–10.5)

## 2017-06-04 MED ORDER — SODIUM CHLORIDE 0.9 % IR SOLN
80.0000 mg | Status: AC
  Administered 2017-06-06: 80 mg

## 2017-06-04 MED ORDER — ATORVASTATIN CALCIUM 40 MG PO TABS
40.0000 mg | ORAL_TABLET | Freq: Every day | ORAL | Status: DC
  Administered 2017-06-04 – 2017-06-07 (×4): 40 mg
  Filled 2017-06-04 (×4): qty 1

## 2017-06-04 MED ORDER — CEFAZOLIN SODIUM-DEXTROSE 2-4 GM/100ML-% IV SOLN
2.0000 g | INTRAVENOUS | Status: AC
  Administered 2017-06-06: 2 g via INTRAVENOUS

## 2017-06-04 MED ORDER — SODIUM CHLORIDE 0.9 % IV SOLN: INTRAVENOUS | Status: DC

## 2017-06-04 NOTE — Progress Notes (Signed)
New pt got transferred from [name] Pt brought to the floor in stable condition with 4l oxygen via . Vitals taken. Initial Assessment done. All immediate pertinent needs to patient addressed. Patient Guide given to patient. Important safety instructions relating to hospitalization reviewed with patient. Patient verbalized understanding. Will continue to monitor pt.

## 2017-06-04 NOTE — Progress Notes (Signed)
Attempted report X2 

## 2017-06-04 NOTE — Progress Notes (Signed)
Subjective:  No recurrent ventricular fibrillation.  Complains of soreness in his chest from the CPR that he had.  He has a nonischemic cardiomyopathy was seen by EP with plans for a defibrillator and MRI on Monday.  Patient asking about driving restrictions.  Objective:  Vital Signs in the last 24 hours: BP 129/68   Pulse 72   Temp 98.9 F (37.2 C) (Oral)   Resp (!) 25   Ht 6\' 1"  (1.854 m)   Wt 86.9 kg (191 lb 9.3 oz)   SpO2 98%   BMI 25.28 kg/m   Physical Exam: Pleasant male in no acute distress Lungs:  Clear Cardiac:  Regular rhythm, normal S1 and S2, no S3 Extremities:  No edema present  Intake/Output from previous day: 09/07 0701 - 09/08 0700 In: 902.6 [I.V.:852.6; IV Piggyback:50] Out: 1050 [Urine:1050]  Weight Filed Weights   06/02/17 0208 06/03/17 0206 06/04/17 0332  Weight: 85 kg (187 lb 6.3 oz) 89.2 kg (196 lb 10.4 oz) 86.9 kg (191 lb 9.3 oz)    Lab Results: Basic Metabolic Panel:  Recent Labs  16/10/96 2026 06/03/17 0327 06/03/17 1408  NA 136 136  --   K 3.5 3.5  --   CL 109 110  --   CO2 20* 21*  --   GLUCOSE 130* 117*  --   BUN 6 7  --   CREATININE 1.16 1.00 0.91   CBC:  Recent Labs  06/03/17 1408 06/04/17 0327  WBC 11.2* 10.6*  HGB 10.6* 9.9*  HCT 31.6* 29.6*  MCV 92.7 92.5  PLT 148* 135*   Cardiac Panel (last 3 results)  Recent Labs  06/01/17 1538 06/02/17 0307  TROPONINI 1.34* 21.97*    Telemetry: Normal sinus rhythm  Assessment/Plan:  1.  Out of hospital cardiac arrest 2.  Nonischemic cardiomyopathy 3.  Peripheral vascular disease  Recommendations:  Okay to transfer to floor.  Start medicines for LV dysfunction.  Previously was on benazepril.  Plan for MRI and defibrillator on Monday.  Darden Palmer  MD [facility]  06/04/2017, 11:45 AM

## 2017-06-05 ENCOUNTER — Inpatient Hospital Stay (HOSPITAL_COMMUNITY): Payer: 59

## 2017-06-05 MED ORDER — CARVEDILOL 3.125 MG PO TABS
3.1250 mg | ORAL_TABLET | Freq: Two times a day (BID) | ORAL | Status: DC
  Administered 2017-06-05 – 2017-06-07 (×4): 3.125 mg via ORAL
  Filled 2017-06-05 (×4): qty 1

## 2017-06-05 MED ORDER — GUAIFENESIN-DM 100-10 MG/5ML PO SYRP
5.0000 mL | ORAL_SOLUTION | ORAL | Status: DC | PRN
  Administered 2017-06-05: 5 mL via ORAL
  Filled 2017-06-05: qty 5

## 2017-06-05 MED ORDER — POTASSIUM CHLORIDE CRYS ER 20 MEQ PO TBCR
40.0000 meq | EXTENDED_RELEASE_TABLET | Freq: Once | ORAL | Status: AC
  Administered 2017-06-05: 40 meq via ORAL
  Filled 2017-06-05: qty 2

## 2017-06-05 MED ORDER — GADOBENATE DIMEGLUMINE 529 MG/ML IV SOLN
20.0000 mL | Freq: Once | INTRAVENOUS | Status: AC
  Administered 2017-06-05: 38 mL via INTRAVENOUS

## 2017-06-05 NOTE — Progress Notes (Addendum)
Progress Note  Patient Name: Rick Logan Date of Encounter: 06/05/2017  Primary Cardiologist: Katrinka Blazing  Subjective   Has chest pain with cough, no significant shortness of breath.  Inpatient Medications    Scheduled Meds: . amoxicillin-clavulanate  1 tablet Oral Q12H  . aspirin  81 mg Oral Daily  . atorvastatin  40 mg Per Tube Daily  . clopidogrel  75 mg Oral Daily  . enoxaparin (LOVENOX) injection  40 mg Subcutaneous Q24H  . [START ON 06/06/2017] gentamicin irrigation  80 mg Irrigation On Call   Continuous Infusions: . sodium chloride 10 mL/hr at 06/02/17 0922  . sodium chloride    . [START ON 06/06/2017] sodium chloride    . [START ON 06/06/2017] sodium chloride    . [START ON 06/06/2017]  ceFAZolin (ANCEF) IV     PRN Meds: sodium chloride, ibuprofen, ipratropium-albuterol   Vital Signs    Vitals:   06/04/17 1300 06/04/17 1402 06/04/17 2112 06/05/17 0701  BP: 128/74 125/61 (!) 119/56 (!) 143/64  Pulse: 78 68 71 81  Resp: (!) Temp:  (!) 97.5 F (36.4 C) 98.2 F (36.8 C) 97.9 F (36.6 C)  TempSrc:  Oral Oral Oral  SpO2: 97% 93% 92% 94%  Weight:  187 lb 14.4 oz (85.2 kg)  185 lb 6.4 oz (84.1 kg)  Height:  5' 11.5" (1.816 m)      Intake/Output Summary (Last 24 hours) at 06/05/17 1151 Last data filed at 06/05/17 0957  Gross per 24 hour  Intake              700 ml  Output             1450 ml  Net             -750 ml   Filed Weights   06/04/17 0332 06/04/17 1402 06/05/17 0701  Weight: 191 lb 9.3 oz (86.9 kg) 187 lb 14.4 oz (85.2 kg) 185 lb 6.4 oz (84.1 kg)    Telemetry    No recent ventricular tachycardia - Personally Reviewed  ECG    No new tracing  Physical Exam   GEN: No acute distress.   Neck: No JVD, Left IJ in place Cardiac: RRR, no murmurs, rubs, or gallops.  Respiratory: Clear to auscultation bilaterally. GI: Soft, nontender, non-distended  MS: No edema; No deformity. Neuro:  Nonfocal  Psych: Normal affect Mildly  anxious  Labs    Chemistry Recent Labs Lab 06/01/17 0742  06/02/17 0307 06/02/17 2026 06/03/17 0327 06/03/17 1408  NA 138  < > 138 136 136  --   K 3.9  < > 2.9* 3.5 3.5  --   CL 104  < > 114* 109 110  --   CO2 9*  < > 16* 20* 21*  --   GLUCOSE 438*  < > 122* 130* 117*  --   BUN 7  < > --   CREATININE 1.64*  < > 0.90 1.16 1.00 0.91  CALCIUM 8.8*  < > 6.9* 7.3* 7.5*  --   PROT 5.7*  --   --   --   --   --   ALBUMIN 3.3*  --   --   --   --   --   AST 210*  --   --   --   --   --   ALT 220*  --   --   --   --   --  ALKPHOS 83  --   --   --   --   --   BILITOT 0.8  --   --   --   --   --   GFRNONAA 43*  < > >60 >60 >60 >60  GFRAA 50*  < > >60 >60 >60 >60  ANIONGAP 25*  < > 8 7 5   --   < > = values in this interval not displayed.   Hematology Recent Labs Lab 06/03/17 0327 06/03/17 1408 06/04/17 0327  WBC 14.1* 11.2* 10.6*  RBC 3.54* 3.41* 3.20*  HGB 11.0* 10.6* 9.9*  HCT 32.9* 31.6* 29.6*  MCV 92.9 92.7 92.5  MCH 31.1 31.1 30.9  MCHC 33.4 33.5 33.4  RDW 13.8 13.8 13.7  PLT 146* 148* 135*    Cardiac Enzymes Recent Labs Lab 06/01/17 1538 06/02/17 0307  TROPONINI 1.34* 21.97*    Recent Labs Lab 06/01/17 0757 06/01/17 1041  TROPIPOC 0.03 0.65*     BNP Recent Labs Lab 06/01/17 1326  BNP 22.6     DDimer No results for input(s): DDIMER in the last 168 hours.   Radiology    No results found.  Cardiac Studies   2-D Doppler echocardiogram 06/01/2017: Study Conclusions  - Left ventricle: The cavity size was normal. Wall thickness was normal. Systolic function was moderately to severely reduced. The estimated ejection fraction was in the range of 30% to 35%. There is akinesis of the mid-apicalanteroseptal and apical myocardium. Doppler parameters are consistent with abnormal left ventricular relaxation (grade 1 diastolic dysfunction).  Impressions:  - Technically difficult; definity used; akinesis of the distal anteroseptal  and apical walls with overall moderate to severe LV dysfunction; mild diastolic dysfunction; trace TR.  Cardiac catheterization 06/03/17: 1. Minor nonobstructive CAD 2. Moderate LV dysfunction. There a segmental wall motion abnormalities involving the mid ventricular segments and inferoapex. Consider Takotsubo syndrome. 3. Normal LVEDP.  Patient Profile     61 y.o. male with a hx of hypertension, hyperlipidemia, PAD with prior LLE intervention by Dr. Allyson Sabal, and tobacco usewho suffered ventricular fibrillation and was successfully resuscitated. Severe acidosis and hyperglycemia have been treated. Kidney function is norrmal, echo shows EF 30%, ECG abnormal, and enzymes c/w NSTEMI. Minor nonobstructive coronary artery disease on cardiac catheterization.  Assessment & Plan    Cardiac arrest-ventricular fibrillation, shocked multiple times, 20 minutes for return of spontaneous circulation-in ER able to follow commands.   - Post resuscitation, EF 30-35%, nonischemic cardiomyopathy  - Awaiting cardiac MRI on Monday.  - Awaiting EP placement of defibrillator on Monday.  - Currently on Plavix and aspirin  - He is not on beta blocker or ACE inhibitor given his recent shock.  - Low-voltage EKG raises the possibility of infiltrative process?  - No driving 6 months  - We will start low-dose carvedilol 3.125 twice a day  - Add angiotensin receptor blocker next.  Hypokalemia  - Replete, 3.5  Chest wall pain  - Hurts when he coughs, post-CPR, x-rays showed no evidence of fracture  - We will give him Robitussin-DM  Discontinue left IJ, place peripheral IV  For questions or updates, please contact [facility] Please consult [email or web address] for contact info under Cardiology/STEMI. Daytime calls, contact the Day Call APP (6a-8a) or assigned team (Teams A-D) provider (7:30a - 5p). All other daytime calls (7:30-5p), contact the Card Master @ [phone].   Nighttime calls, contact the assigned  APP (5p-8p) or MD (6:30p-8p). Overnight calls (8p-6a), contact the on call Fellow @ [phone].  Signed, Donato Schultz, MD  06/05/2017, 11:51 AM

## 2017-06-05 NOTE — Progress Notes (Signed)
Please call patient Daughter if Surgery is before expected scheduled time. [phone].

## 2017-06-06 ENCOUNTER — Inpatient Hospital Stay (HOSPITAL_COMMUNITY)
Admission: EM | Disposition: A | Discharge status: Home / Self Care | Payer: Self-pay | Source: Home / Self Care | Attending: Interventional Cardiology

## 2017-06-06 ENCOUNTER — Encounter (HOSPITAL_COMMUNITY): Payer: Self-pay | Admitting: Internal Medicine

## 2017-06-06 DIAGNOSIS — I4901 Ventricular fibrillation: Secondary | ICD-10-CM

## 2017-06-06 DIAGNOSIS — I428 Other cardiomyopathies: Secondary | ICD-10-CM

## 2017-06-06 HISTORY — PX: ICD IMPLANT: EP1208

## 2017-06-06 LAB — GLUCOSE, CAPILLARY: Glucose-Capillary: 94 mg/dL (ref 65–99)

## 2017-06-06 LAB — BASIC METABOLIC PANEL
Anion gap: 7 (ref 5–15)
BUN: 6 mg/dL (ref 6–20)
CHLORIDE: 111 mmol/L (ref 101–111)
CO2: 22 mmol/L (ref 22–32)
CREATININE: 0.79 mg/dL (ref 0.61–1.24)
Calcium: 8.1 mg/dL — ABNORMAL LOW (ref 8.9–10.3)
GFR calc Af Amer: >60 mL/min (ref >60)
GFR calc non Af Amer: >60 mL/min (ref >60)
GLUCOSE: 88 mg/dL (ref 65–99)
POTASSIUM: 3.4 mmol/L — AB (ref 3.5–5.1)
SODIUM: 140 mmol/L (ref 135–145)

## 2017-06-06 SURGERY — ICD IMPLANT

## 2017-06-06 MED ORDER — PNEUMOCOCCAL VAC POLYVALENT 25 MCG/0.5ML IJ INJ
0.5000 mL | INJECTION | INTRAMUSCULAR | Status: AC
Start: 2017-06-07 — End: 2017-06-07
  Administered 2017-06-07: 0.5 mL via INTRAMUSCULAR
  Filled 2017-06-06 (×2): qty 0.5

## 2017-06-06 MED ORDER — FENTANYL CITRATE (PF) 100 MCG/2ML IJ SOLN
INTRAMUSCULAR | Status: DC | PRN
  Administered 2017-06-06 (×4): 25 ug via INTRAVENOUS

## 2017-06-06 MED ORDER — ACETAMINOPHEN 325 MG PO TABS: 325.0000 mg | ORAL_TABLET | ORAL | Status: DC | PRN

## 2017-06-06 MED ORDER — HEPARIN (PORCINE) IN NACL 2-0.9 UNIT/ML-% IJ SOLN
INTRAMUSCULAR | Status: AC
  Filled 2017-06-06: qty 500

## 2017-06-06 MED ORDER — CEFAZOLIN SODIUM-DEXTROSE 1-4 GM/50ML-% IV SOLN
1.0000 g | Freq: Four times a day (QID) | INTRAVENOUS | Status: AC
  Administered 2017-06-06 – 2017-06-07 (×3): 1 g via INTRAVENOUS
  Filled 2017-06-06 (×3): qty 50

## 2017-06-06 MED ORDER — HEPARIN SODIUM (PORCINE) 1000 UNIT/ML IJ SOLN
INTRAMUSCULAR | Status: AC
  Filled 2017-06-06: qty 1

## 2017-06-06 MED ORDER — SODIUM CHLORIDE 0.9 % IV SOLN
INTRAVENOUS | Status: AC
  Administered 2017-06-06: 50 mL/h via INTRAVENOUS

## 2017-06-06 MED ORDER — FENTANYL CITRATE (PF) 100 MCG/2ML IJ SOLN
INTRAMUSCULAR | Status: AC
  Filled 2017-06-06: qty 2

## 2017-06-06 MED ORDER — LISINOPRIL 2.5 MG PO TABS
2.5000 mg | ORAL_TABLET | Freq: Every day | ORAL | Status: DC
  Administered 2017-06-06 – 2017-06-07 (×2): 2.5 mg via ORAL
  Filled 2017-06-06 (×2): qty 1

## 2017-06-06 MED ORDER — HEPARIN (PORCINE) IN NACL 2-0.9 UNIT/ML-% IJ SOLN
INTRAMUSCULAR | Status: AC | PRN
  Administered 2017-06-06: 500 mL

## 2017-06-06 MED ORDER — ONDANSETRON HCL 4 MG/2ML IJ SOLN: 4.0000 mg | Freq: Four times a day (QID) | INTRAMUSCULAR | Status: DC | PRN

## 2017-06-06 MED ORDER — CEFAZOLIN SODIUM-DEXTROSE 2-4 GM/100ML-% IV SOLN
INTRAVENOUS | Status: AC
  Filled 2017-06-06: qty 100

## 2017-06-06 MED ORDER — SODIUM CHLORIDE 0.9 % IV SOLN
INTRAVENOUS | Status: DC
  Administered 2017-06-06: 11:00:00 via INTRAVENOUS

## 2017-06-06 MED ORDER — MIDAZOLAM HCL 5 MG/5ML IJ SOLN
INTRAMUSCULAR | Status: AC
  Filled 2017-06-06: qty 5

## 2017-06-06 MED ORDER — LIDOCAINE HCL (PF) 1 % IJ SOLN
INTRAMUSCULAR | Status: AC
  Filled 2017-06-06: qty 60

## 2017-06-06 MED ORDER — SODIUM CHLORIDE 0.9 % IR SOLN
Status: AC
  Filled 2017-06-06: qty 2

## 2017-06-06 MED ORDER — MIDAZOLAM HCL 5 MG/5ML IJ SOLN
INTRAMUSCULAR | Status: DC | PRN
  Administered 2017-06-06 (×5): 1 mg via INTRAVENOUS

## 2017-06-06 MED ORDER — LIDOCAINE HCL (PF) 1 % IJ SOLN
INTRAMUSCULAR | Status: DC | PRN
  Administered 2017-06-06: 45 mL

## 2017-06-06 SURGICAL SUPPLY — 7 items
CABLE SURGICAL S-101-97-12 (CABLE) ×2 IMPLANT
ICD VISIA MRI VR DVFB1D4 (ICD Generator) IMPLANT
LEAD SPRINT QUAT SEC 6935M-62 (Lead) ×2 IMPLANT
PAD DEFIB LIFELINK (PAD) ×2 IMPLANT
SHEATH CLASSIC 9F (SHEATH) ×2 IMPLANT
TRAY PACEMAKER INSERTION (PACKS) ×2 IMPLANT
VISIA MRI VR DVFB1D4 (ICD Generator) ×3 IMPLANT

## 2017-06-06 NOTE — Progress Notes (Signed)
Progress Note  Patient Name: Rick Logan Date of Encounter: 06/06/2017  Primary Cardiologist: Katrinka Blazing  Subjective   C/w chest wall tenderness, no anginal symptoms, has been ambulating in the room some, no SOB, no palpitations  Inpatient Medications    Scheduled Meds: . amoxicillin-clavulanate  1 tablet Oral Q12H  . aspirin  81 mg Oral Daily  . atorvastatin  40 mg Per Tube Daily  . carvedilol  3.125 mg Oral BID WC  . clopidogrel  75 mg Oral Daily  . enoxaparin (LOVENOX) injection  40 mg Subcutaneous Q24H  . gentamicin irrigation  80 mg Irrigation On Call  . [START ON 06/07/2017] pneumococcal 23 valent vaccine  0.5 mL Intramuscular Tomorrow-1000   Continuous Infusions: . sodium chloride 10 mL/hr at 06/02/17 0922  . sodium chloride    . sodium chloride    . sodium chloride    .  ceFAZolin (ANCEF) IV     PRN Meds: sodium chloride, guaiFENesin-dextromethorphan, ibuprofen, ipratropium-albuterol   Vital Signs    Vitals:   06/05/17 0701 06/05/17 1213 06/05/17 2005 06/06/17 0416  BP: (!) 143/64 125/63 137/67 124/67  Pulse: 81 80 62 72  Resp:  20 18 18   Temp: 97.9 F (36.6 C) 97.6 F (36.4 C) 98.2 F (36.8 C) 98.4 F (36.9 C)  TempSrc: Oral Oral  Oral  SpO2: 94% 96% 97% 96%  Weight: 185 lb 6.4 oz (84.1 kg)   182 lb (82.6 kg)  Height:        Intake/Output Summary (Last 24 hours) at 06/06/17 0852 Last data filed at 06/06/17 0704  Gross per 24 hour  Intake              720 ml  Output             1300 ml  Net             -580 ml   Filed Weights   06/04/17 1402 06/05/17 0701 06/06/17 0416  Weight: 187 lb 14.4 oz (85.2 kg) 185 lb 6.4 oz (84.1 kg) 182 lb (82.6 kg)    Telemetry    SR 60's - Personally Reviewed  ECG    No new tracing  Physical Exam   GEN: No acute distress.   Neck: No JVD, Left IJ has been removed Cardiac: RRR, no murmurs, rubs, or gallops.  Respiratory: Clear to auscultation bilaterally. GI: Soft, nontender, non-distended  MS: No  edema; No deformity. Neuro:  Nonfocal  Psych: Normal affect Mildly anxious  Labs    Chemistry Recent Labs Lab 06/01/17 0742  06/02/17 0307 06/02/17 2026 06/03/17 0327 06/03/17 1408  NA 138  < > 138 136 136  --   K 3.9  < > 2.9* 3.5 3.5  --   CL 104  < > 114* 109 110  --   CO2 9*  < > 16* 20* 21*  --   GLUCOSE 438*  < > 122* 130* 117*  --   BUN 7  < > 9 6 7   --   CREATININE 1.64*  < > 0.90 1.16 1.00 0.91  CALCIUM 8.8*  < > 6.9* 7.3* 7.5*  --   PROT 5.7*  --   --   --   --   --   ALBUMIN 3.3*  --   --   --   --   --   AST 210*  --   --   --   --   --   ALT 220*  --   --   --   --   --  ALKPHOS 83  --   --   --   --   --   BILITOT 0.8  --   --   --   --   --   GFRNONAA 43*  < > >60 >60 >60 >60  GFRAA 50*  < > >60 >60 >60 >60  ANIONGAP 25*  < > --   < > = values in this interval not displayed.   Hematology  Recent Labs Lab 06/03/17 0327 06/03/17 1408 06/04/17 0327  WBC 14.1* 11.2* 10.6*  RBC 3.54* 3.41* 3.20*  HGB 11.0* 10.6* 9.9*  HCT 32.9* 31.6* 29.6*  MCV 92.9 92.7 92.5  MCH 31.1 31.1 30.9  MCHC 33.4 33.5 33.4  RDW 13.8 13.8 13.7  PLT 146* 148* 135*    Cardiac Enzymes  Recent Labs Lab 06/01/17 1538 06/02/17 0307  TROPONINI 1.34* 21.97*     Recent Labs Lab 06/01/17 0757 06/01/17 1041  TROPIPOC 0.03 0.65*     BNP  Recent Labs Lab 06/01/17 1326  BNP 22.6     DDimer No results for input(s): DDIMER in the last 168 hours.   Radiology    Dg Eye Foreign Body Result Date: 06/05/2017 CLINICAL DATA:  Metal working/exposure; clearance prior to MRI. EXAM: ORBITS FOR FOREIGN BODY - 2 VIEW COMPARISON:  None. FINDINGS: There is no evidence of metallic foreign body within the orbits. No significant bone abnormality identified. IMPRESSION: No evidence of metallic foreign body within the orbits. Electronically Signed   By: Rubye Oaks M.D.   On: 06/05/2017 18:43    Cardiac Studies   2-D Doppler echocardiogram 06/01/2017: Study Conclusions -  Left ventricle: The cavity size was normal. Wall thickness was normal. Systolic function was moderately to severely reduced. The estimated ejection fraction was in the range of 30% to 35%. There is akinesis of the mid-apicalanteroseptal and apical myocardium. Doppler parameters are consistent with abnormal left ventricular relaxation (grade 1 diastolic dysfunction). Impressions: - Technically difficult; definity used; akinesis of the distal anteroseptal and apical walls with overall moderate to severe LV dysfunction; mild diastolic dysfunction; trace TR.  Cardiac catheterization 06/03/17: 1. Minor nonobstructive CAD 2. Moderate LV dysfunction. There a segmental wall motion abnormalities involving the mid ventricular segments and inferoapex. Consider Takotsubo syndrome. 3. Normal LVEDP.  Patient Profile     61 y.o. male with a hx of hypertension, hyperlipidemia, PAD with prior LLE intervention by Dr. Allyson Sabal, and tobacco usewho suffered ventricular fibrillation and was successfully resuscitated. Severe acidosis and hyperglycemia have been treated. Kidney function is norrmal, echo shows EF 30%, ECG abnormal, and enzymes c/w NSTEMI. Minor nonobstructive coronary artery disease on cardiac catheterization.  Assessment & Plan    Cardiac arrest-ventricular fibrillation, shocked multiple times, 20 minutes for return of spontaneous circulation-in ER able to follow commands.   - Post resuscitation, EF 30-35%, nonischemic cardiomyopathy  - s/p cardiac MRI, pending reading  - planned for ICD today with Dr. Graciela Husbands  - Currently on Plavix and aspirin w/hx of PVD with LE intervention Dec.2016 (Left SFA atherectomy), and known asymptomatic carotid disease  - Low-voltage EKG raises the possibility of infiltrative process?  - No driving 6 months, patient is aware  - On low-dose carvedilol 3.125 twice a day  - Add angiotensin receptor blocker next.  Cardiac MRI is completed, pending  reading Discussed ICD procedure, risks and benefits with the patient and his wife at bedside.  He would like to proceed. Will hold his Plavix for now, he has  not had any vascular interventions >12 months, no coronary interventions ever.    Chest wall pain  - Hurts when he coughs, post-CPR, x-rays showed no evidence of fracture   Smoker - counseled, he is receptive       Signed, Sheilah Pigeon, PA-C  06/06/2017, 8:52 AM    Pt seen and examined   Procedure reviewed with him and his daughter  Have reviewed the potential benefits and risks of ICD implantation including but not limited to death, perforation of heart or lung, lead dislodgement, infection,  device malfunction and inappropriate shocks.  The patient and family express understanding  and are willing to proceed.

## 2017-06-06 NOTE — H&P (View-Only) (Signed)
Progress Note  Patient Name: Rick Logan Date of Encounter: 06/06/2017  Primary Cardiologist: Katrinka Blazing  Subjective   C/w chest wall tenderness, no anginal symptoms, has been ambulating in the room some, no SOB, no palpitations  Inpatient Medications    Scheduled Meds: . amoxicillin-clavulanate  1 tablet Oral Q12H  . aspirin  81 mg Oral Daily  . atorvastatin  40 mg Per Tube Daily  . carvedilol  3.125 mg Oral BID WC  . clopidogrel  75 mg Oral Daily  . enoxaparin (LOVENOX) injection  40 mg Subcutaneous Q24H  . gentamicin irrigation  80 mg Irrigation On Call  . [START ON 06/07/2017] pneumococcal 23 valent vaccine  0.5 mL Intramuscular Tomorrow-1000   Continuous Infusions: . sodium chloride 10 mL/hr at 06/02/17 0922  . sodium chloride    . sodium chloride    . sodium chloride    .  ceFAZolin (ANCEF) IV     PRN Meds: sodium chloride, guaiFENesin-dextromethorphan, ibuprofen, ipratropium-albuterol   Vital Signs    Vitals:   06/05/17 0701 06/05/17 1213 06/05/17 2005 06/06/17 0416  BP: (!) 143/64 125/63 137/67 124/67  Pulse: 81 80 62 72  Resp:  20 18 18   Temp: 97.9 F (36.6 C) 97.6 F (36.4 C) 98.2 F (36.8 C) 98.4 F (36.9 C)  TempSrc: Oral Oral  Oral  SpO2: 94% 96% 97% 96%  Weight: 185 lb 6.4 oz (84.1 kg)   182 lb (82.6 kg)  Height:        Intake/Output Summary (Last 24 hours) at 06/06/17 0852 Last data filed at 06/06/17 0704  Gross per 24 hour  Intake              720 ml  Output             1300 ml  Net             -580 ml   Filed Weights   06/04/17 1402 06/05/17 0701 06/06/17 0416  Weight: 187 lb 14.4 oz (85.2 kg) 185 lb 6.4 oz (84.1 kg) 182 lb (82.6 kg)    Telemetry    SR 60's - Personally Reviewed  ECG    No new tracing  Physical Exam   GEN: No acute distress.   Neck: No JVD, Left IJ has been removed Cardiac: RRR, no murmurs, rubs, or gallops.  Respiratory: Clear to auscultation bilaterally. GI: Soft, nontender, non-distended  MS: No  edema; No deformity. Neuro:  Nonfocal  Psych: Normal affect Mildly anxious  Labs    Chemistry Recent Labs Lab 06/01/17 0742  06/02/17 0307 06/02/17 2026 06/03/17 0327 06/03/17 1408  NA 138  < > 138 136 136  --   K 3.9  < > 2.9* 3.5 3.5  --   CL 104  < > 114* 109 110  --   CO2 9*  < > 16* 20* 21*  --   GLUCOSE 438*  < > 122* 130* 117*  --   BUN 7  < > 9 6 7   --   CREATININE 1.64*  < > 0.90 1.16 1.00 0.91  CALCIUM 8.8*  < > 6.9* 7.3* 7.5*  --   PROT 5.7*  --   --   --   --   --   ALBUMIN 3.3*  --   --   --   --   --   AST 210*  --   --   --   --   --   ALT 220*  --   --   --   --   --  ALKPHOS 83  --   --   --   --   --   BILITOT 0.8  --   --   --   --   --   GFRNONAA 43*  < > >60 >60 >60 >60  GFRAA 50*  < > >60 >60 >60 >60  ANIONGAP 25*  < > --   < > = values in this interval not displayed.   Hematology  Recent Labs Lab 06/03/17 0327 06/03/17 1408 06/04/17 0327  WBC 14.1* 11.2* 10.6*  RBC 3.54* 3.41* 3.20*  HGB 11.0* 10.6* 9.9*  HCT 32.9* 31.6* 29.6*  MCV 92.9 92.7 92.5  MCH 31.1 31.1 30.9  MCHC 33.4 33.5 33.4  RDW 13.8 13.8 13.7  PLT 146* 148* 135*    Cardiac Enzymes  Recent Labs Lab 06/01/17 1538 06/02/17 0307  TROPONINI 1.34* 21.97*     Recent Labs Lab 06/01/17 0757 06/01/17 1041  TROPIPOC 0.03 0.65*     BNP  Recent Labs Lab 06/01/17 1326  BNP 22.6     DDimer No results for input(s): DDIMER in the last 168 hours.   Radiology    Dg Eye Foreign Body Result Date: 06/05/2017 CLINICAL DATA:  Metal working/exposure; clearance prior to MRI. EXAM: ORBITS FOR FOREIGN BODY - 2 VIEW COMPARISON:  None. FINDINGS: There is no evidence of metallic foreign body within the orbits. No significant bone abnormality identified. IMPRESSION: No evidence of metallic foreign body within the orbits. Electronically Signed   By: Rubye Oaks M.D.   On: 06/05/2017 18:43    Cardiac Studies   2-D Doppler echocardiogram 06/01/2017: Study Conclusions -  Left ventricle: The cavity size was normal. Wall thickness was normal. Systolic function was moderately to severely reduced. The estimated ejection fraction was in the range of 30% to 35%. There is akinesis of the mid-apicalanteroseptal and apical myocardium. Doppler parameters are consistent with abnormal left ventricular relaxation (grade 1 diastolic dysfunction). Impressions: - Technically difficult; definity used; akinesis of the distal anteroseptal and apical walls with overall moderate to severe LV dysfunction; mild diastolic dysfunction; trace TR.  Cardiac catheterization 06/03/17: 1. Minor nonobstructive CAD 2. Moderate LV dysfunction. There a segmental wall motion abnormalities involving the mid ventricular segments and inferoapex. Consider Takotsubo syndrome. 3. Normal LVEDP.  Patient Profile     61 y.o. male with a hx of hypertension, hyperlipidemia, PAD with prior LLE intervention by Dr. Allyson Sabal, and tobacco usewho suffered ventricular fibrillation and was successfully resuscitated. Severe acidosis and hyperglycemia have been treated. Kidney function is norrmal, echo shows EF 30%, ECG abnormal, and enzymes c/w NSTEMI. Minor nonobstructive coronary artery disease on cardiac catheterization.  Assessment & Plan    Cardiac arrest-ventricular fibrillation, shocked multiple times, 20 minutes for return of spontaneous circulation-in ER able to follow commands.   - Post resuscitation, EF 30-35%, nonischemic cardiomyopathy  - s/p cardiac MRI, pending reading  - planned for ICD today with Dr. Graciela Husbands  - Currently on Plavix and aspirin w/hx of PVD with LE intervention Dec.2016 (Left SFA atherectomy), and known asymptomatic carotid disease  - Low-voltage EKG raises the possibility of infiltrative process?  - No driving 6 months, patient is aware  - On low-dose carvedilol 3.125 twice a day  - Add angiotensin receptor blocker next.  Cardiac MRI is completed, pending  reading Discussed ICD procedure, risks and benefits with the patient and his wife at bedside.  He would like to proceed. Will hold his Plavix for now, he has  not had any vascular interventions >12 months, no coronary interventions ever.    Chest wall pain  - Hurts when he coughs, post-CPR, x-rays showed no evidence of fracture   Smoker - counseled, he is receptive       Signed, Renee Lynn Ursuy, PA-C  06/06/2017, 8:52 AM    Pt seen and examined   Procedure reviewed with him and his daughter  Have reviewed the potential benefits and risks of ICD implantation including but not limited to death, perforation of heart or lung, lead dislodgement, infection,  device malfunction and inappropriate shocks.  The patient and family express understanding  and are willing to proceed.    

## 2017-06-06 NOTE — Discharge Instructions (Signed)
° ° °  Supplemental Discharge Instructions for  Pacemaker/Defibrillator Patients  Activity No heavy lifting or vigorous activity with your left/right arm for 6 to 8 weeks.  Do not raise your left/right arm above your head for one week.  Gradually raise your affected arm as drawn below.             06/10/17                     06/11/17                    06/12/17                   06/13/17 __  NO DRIVING for 6 months  WOUND CARE - Keep the wound area clean and dry.  Do not get this area wet for one week. No showers for 24 hours; you may shower on  06/07/17 evening  . - The tape/steri-strips on your wound will fall off; do not pull them off.  No bandage is needed on the site.  DO  NOT apply any creams, oils, or ointments to the wound area. - If you notice any drainage or discharge from the wound, any swelling or bruising at the site, or you develop a fever > 101? F after you are discharged home, call the office at once.  Special Instructions - You are still able to use cellular telephones; use the ear opposite the side where you have your pacemaker/defibrillator.  Avoid carrying your cellular phone near your device. - When traveling through airports, show security personnel your identification card to avoid being screened in the metal detectors.  Ask the security personnel to use the hand wand. - Avoid arc welding equipment, MRI testing (magnetic resonance imaging), TENS units (transcutaneous nerve stimulators).  Call the office for questions about other devices. - Avoid electrical appliances that are in poor condition or are not properly grounded. - Microwave ovens are safe to be near or to operate.  Additional information for defibrillator patients should your device go off: - If your device goes off ONCE and you feel fine afterward, notify the device clinic nurses. - If your device goes off ONCE and you do not feel well afterward, call 911. - If your device goes off TWICE, call 911. - If your  device goes off THREE times in one day, call 911.  DO NOT DRIVE YOURSELF OR A FAMILY MEMBER WITH A DEFIBRILLATOR TO THE HOSPITAL--CALL 911.

## 2017-06-06 NOTE — Interval H&P Note (Signed)
ICD Criteria  Current LVEF:30%. Within 12 months prior to implant: Yes   Heart failure history: Yes, Class II  Cardiomyopathy history: Yes, Non-Ischemic Cardiomyopathy.  Atrial Fibrillation/Atrial Flutter: No.  Ventricular tachycardia history: Yes, Hemodynamic instability present. VT Type: Sustained Ventricular Tachycardia - Polymorphic.  Cardiac arrest history: Yes, Ventricular Fibrillation.  History of syndromes with risk of sudden death: No.  Previous ICD: No.  Current ICD indication: Secondary  PPM indication: No.   Class I or II Bradycardia indication present: No  Beta Blocker therapy for 3 or more months: Yes, prescribed.   Ace Inhibitor/ARB therapy for 3 or more months: Yes, prescribed.   History and Physical Interval Note:  06/06/2017 12:07 PM  Rick Logan  has presented today for surgery, with the diagnosis of VF  The various methods of treatment have been discussed with the patient and family. After consideration of risks, benefits and other options for treatment, the patient has consented to  Procedure(s): ICD Implant (N/A) as a surgical intervention .  The patient's history has been reviewed, patient examined, no change in status, stable for surgery.  I have reviewed the patient's chart and labs.  Questions were answered to the patient's satisfaction.     Sherryl Manges

## 2017-06-06 NOTE — Progress Notes (Signed)
Progress Note  Patient Name: Rick Logan Date of Encounter: 06/06/2017  Primary Cardiologist: Katrinka Blazing  Subjective   Doing well with exception of chest soreness. Planned for ICD today   Inpatient Medications    Scheduled Meds: . amoxicillin-clavulanate  1 tablet Oral Q12H  . aspirin  81 mg Oral Daily  . atorvastatin  40 mg Per Tube Daily  . carvedilol  3.125 mg Oral BID WC  . enoxaparin (LOVENOX) injection  40 mg Subcutaneous Q24H  . gentamicin irrigation  80 mg Irrigation On Call  . [START ON 06/07/2017] pneumococcal 23 valent vaccine  0.5 mL Intramuscular Tomorrow-1000   Continuous Infusions: . sodium chloride 10 mL/hr at 06/02/17 0922  . sodium chloride    . sodium chloride    . sodium chloride    .  ceFAZolin (ANCEF) IV     PRN Meds: sodium chloride, guaiFENesin-dextromethorphan, ibuprofen, ipratropium-albuterol   Vital Signs    Vitals:   06/05/17 0701 06/05/17 1213 06/05/17 2005 06/06/17 0416  BP: (!) 143/64 125/63 137/67 124/67  Pulse: 81 80 62 72  Resp:  20 18 18   Temp: 97.9 F (36.6 C) 97.6 F (36.4 C) 98.2 F (36.8 C) 98.4 F (36.9 C)  TempSrc: Oral Oral  Oral  SpO2: 94% 96% 97% 96%  Weight: 185 lb 6.4 oz (84.1 kg)   182 lb (82.6 kg)  Height:        Intake/Output Summary (Last 24 hours) at 06/06/17 0955 Last data filed at 06/06/17 3009  Gross per 24 hour  Intake              720 ml  Output             1300 ml  Net             -580 ml   Filed Weights   06/04/17 1402 06/05/17 0701 06/06/17 0416  Weight: 187 lb 14.4 oz (85.2 kg) 185 lb 6.4 oz (84.1 kg) 182 lb (82.6 kg)    Telemetry    SR - Personally Reviewed  ECG    N/a - Personally Reviewed  Physical Exam   General: Well developed, well nourished, male appearing in no acute distress. Head: Normocephalic, atraumatic.  Neck: Supple without bruits, JVD. Lungs:  Resp regular and unlabored, CTA. Heart: RRR, S1, S2, no S3, S4, or murmur; no rub. Abdomen: Soft, non-tender,  non-distended with normoactive bowel sounds. No hepatomegaly. No rebound/guarding. No obvious abdominal masses. Extremities: No clubbing, cyanosis, edema. Distal pedal pulses are 2+ bilaterally. Neuro: Alert and oriented X 3. Moves all extremities spontaneously. Psych: Normal affect.  Labs    Chemistry Recent Labs Lab 06/01/17 [phone]  06/02/17 0307 06/02/17 2026 06/03/17 0327 06/03/17 1408  NA 138  < > 138 136 136  --   K 3.9  < > 2.9* 3.5 3.5  --   CL 104  < > 114* 109 110  --   CO2 9*  < > 16* 20* 21*  --   GLUCOSE 438*  < > 122* 130* 117*  --   BUN 7  < > 9 6 7   --   CREATININE 1.64*  < > 0.90 1.16 1.00 0.91  CALCIUM 8.8*  < > 6.9* 7.3* 7.5*  --   PROT 5.7*  --   --   --   --   --   ALBUMIN 3.3*  --   --   --   --   --   AST 210*  --   --   --   --   --  ALT 220*  --   --   --   --   --   ALKPHOS 83  --   --   --   --   --   BILITOT 0.8  --   --   --   --   --   GFRNONAA 43*  < > >60 >60 >60 >60  GFRAA 50*  < > >60 >60 >60 >60  ANIONGAP 25*  < > --   < > = values in this interval not displayed.   Hematology Recent Labs Lab 06/03/17 0327 06/03/17 1408 06/04/17 0327  WBC 14.1* 11.2* 10.6*  RBC 3.54* 3.41* 3.20*  HGB 11.0* 10.6* 9.9*  HCT 32.9* 31.6* 29.6*  MCV 92.9 92.7 92.5  MCH 31.1 31.1 30.9  MCHC 33.4 33.5 33.4  RDW 13.8 13.8 13.7  PLT 146* 148* 135*    Cardiac Enzymes Recent Labs Lab 06/01/17 1538 06/02/17 0307  TROPONINI 1.34* 21.97*    Recent Labs Lab 06/01/17 0757 06/01/17 1041  TROPIPOC 0.03 0.65*     BNP Recent Labs Lab 06/01/17 1326  BNP 22.6     DDimer No results for input(s): DDIMER in the last 168 hours.    Radiology    Dg Eye Foreign Body  Result Date: 06/05/2017 CLINICAL DATA:  Metal working/exposure; clearance prior to MRI. EXAM: ORBITS FOR FOREIGN BODY - 2 VIEW COMPARISON:  None. FINDINGS: There is no evidence of metallic foreign body within the orbits. No significant bone abnormality identified. IMPRESSION: No  evidence of metallic foreign body within the orbits. Electronically Signed   By: Rubye Oaks M.D.   On: 06/05/2017 18:43    Cardiac Studies   2-D Doppler echocardiogram 06/01/2017: Study Conclusions  - Left ventricle: The cavity size was normal. Wall thickness was normal. Systolic function was moderately to severely reduced. The estimated ejection fraction was in the range of 30% to 35%. There is akinesis of the mid-apicalanteroseptal and apical myocardium. Doppler parameters are consistent with abnormal left ventricular relaxation (grade 1 diastolic dysfunction).  Impressions:  - Technically difficult; definity used; akinesis of the distal anteroseptal and apical walls with overall moderate to severe LV dysfunction; mild diastolic dysfunction; trace TR.  Cardiac catheterization 06/03/17: 1. Minor nonobstructive CAD 2. Moderate LV dysfunction. There a segmental wall motion abnormalities involving the mid ventricular segments and inferoapex. Consider Takotsubo syndrome. 3. Normal LVEDP.  Patient Profile     61 y.o. male with a hx of hypertension, hyperlipidemia, PAD with prior LLE intervention by Dr. Allyson Sabal, and tobacco usewho suffered ventricular fibrillation and was successfully resuscitated. Severe acidosis and hyperglycemia have been treated. Kidney function is norrmal, echo shows EF 30%, ECG abnormal, and enzymes c/w NSTEMI. Minor nonobstructive coronary artery disease on cardiac catheterization. Planned for ICD placement.    Assessment & Plan    Cardiac arrest-ventricular fibrillation, shocked multiple times, 20 minutes for return of spontaneous circulation-in ER able to follow commands.   - Post resuscitation, EF 30-35%, nonischemic cardiomyopathy  - Cardiac MRI done, but read is pending.   - on low dose BB therapy, with stable blood pressure. Add Ace-inh today.    - Planned for ICD placement today.  - No driving 6 months  - plavix held today for ICD  as no PCI, or recent CEA intervention in the last 12 months.   Chest wall pain  - Hurts when he coughs, post-CPR, x-rays showed no evidence of fracture  - on Robitussin-DM, discussed splinting  Aspiration PNA  - 7 day course of Augmentin per PCCM (9/13)  Tobacco Use  - cessation discussed, plans to quit  For questions or updates, please contact [facility] Please consult [email or web address] for contact info under Cardiology/STEMI. Daytime calls, contact the Day Call APP (6a-8a) or assigned team (Teams A-D) provider (7:30a - 5p). All other daytime calls (7:30-5p), contact the Card Master @ [phone].   Nighttime calls, contact the assigned APP (5p-8p) or MD (6:30p-8p). Overnight calls (8p-6a), contact the on call Fellow @ [phone].   Signed, Laverda Page, NP  06/06/2017, 9:55 AM  Pager # [phone]   I have personally seen and examined this patient with Laverda Page, NP. I agree with the assessment and plan as outlined above. He is admitted post cardiac arrest. LV systolic function is reduced. Felt to be non-ischemic CM. Cardiac cath with mild CAD. Plan for ICD today. Exam with thin male in NAD. CV:RRR. Pulm: Clear bilaterally. Ext: no edema. Labs reviewed by me.  1. Cardiac arrest: Unclear etiology. No obstructive CAD. LV systolic dysfunction present. QT prolonged on amiodarone. Low voltage on EKG but no LVH on echo. Cardiac MRI today. Plans for ICD later today. Continue beta blocker. Resume Ace-inh.   Verne Carrow 06/06/2017 10:55 AM

## 2017-06-07 ENCOUNTER — Other Ambulatory Visit: Payer: Self-pay

## 2017-06-07 ENCOUNTER — Encounter (HOSPITAL_COMMUNITY): Payer: Self-pay | Admitting: Cardiology

## 2017-06-07 ENCOUNTER — Inpatient Hospital Stay (HOSPITAL_COMMUNITY): Payer: 59

## 2017-06-07 LAB — CULTURE, BLOOD (ROUTINE X 2)
Culture: NO GROWTH
Special Requests: ADEQUATE

## 2017-06-07 LAB — BASIC METABOLIC PANEL
Anion gap: 8 (ref 5–15)
BUN: 11 mg/dL (ref 6–20)
CALCIUM: 8.3 mg/dL — AB (ref 8.9–10.3)
CO2: 24 mmol/L (ref 22–32)
CREATININE: 0.81 mg/dL (ref 0.61–1.24)
Chloride: 107 mmol/L (ref 101–111)
GFR calc Af Amer: >60 mL/min (ref >60)
Glucose, Bld: 100 mg/dL — ABNORMAL HIGH (ref 65–99)
POTASSIUM: 3.3 mmol/L — AB (ref 3.5–5.1)
SODIUM: 139 mmol/L (ref 135–145)

## 2017-06-07 MED ORDER — BENAZEPRIL HCL 40 MG PO TABS: 20.0000 mg | ORAL_TABLET | Freq: Every day | ORAL | 2 refills | Status: DC

## 2017-06-07 MED ORDER — POTASSIUM CHLORIDE CRYS ER 20 MEQ PO TBCR
40.0000 meq | EXTENDED_RELEASE_TABLET | Freq: Once | ORAL | Status: AC
  Administered 2017-06-07: 40 meq via ORAL
  Filled 2017-06-07: qty 2

## 2017-06-07 MED ORDER — CARVEDILOL 3.125 MG PO TABS: 3.1250 mg | ORAL_TABLET | Freq: Two times a day (BID) | ORAL | 2 refills | Status: DC

## 2017-06-07 NOTE — Care Management (Signed)
Mark  With [facility] can be reached to assist with discharge planning needs at [phone]

## 2017-06-07 NOTE — Progress Notes (Signed)
CARDIAC REHAB PHASE I   PRE:  Rate/Rhythm: 79 SR  BP:  Supine: 127/73  Sitting:   Standing:    SaO2: 90%RA  MODE:  Ambulation: 470 ft Walked total of 940 ft   See note below  POST:  Rate/Rhythm: 92 SR  BP:  Supine:   Sitting: 121/64  Standing:    SaO2: 85-90%RA  Did pt's ed and then walked 470 ft again, monitoring RA sats whole walk. Encouraged pursed lip breathing and stopped once to rest. Sats 89-90%. Pt a little lightheaded during both walks but he had not walked in days. BP standing at 125/78..    [id number] Pt has borderline sats on RA as above. Denied feeling SOB. Encouraged IS. Can reach 750 ml correctly. Reviewed CHF signs/symptoms with the low EF and discussed importance of daily weights and 2000 mg sodium restriction. Gave article on takotsubo and explained briefly. Gave MI and CHF booklets, ex ed and smoking cessation. Gave fake cigarette and smoking cessation handout. Pt plans on quitting cold [place]. Discussed CRP 2 and will refer to [place]. May be unable to attend since transportation may be issue since he cannot drive for 6 months. Pt stated he would like to go home.    Luetta Nutting, RN BSN  06/07/2017 11:55 AM

## 2017-06-07 NOTE — Progress Notes (Signed)
Progress Note  Patient Name: Rick Logan Date of Encounter: 06/07/2017  Primary Cardiologist: Katrinka Blazing  Subjective   C/w chest wall tenderness, no anginal symptoms, has been ambulating in the room some, no SOB, no palpitations, denies post-ICD discomfort  Inpatient Medications    Scheduled Meds: . amoxicillin-clavulanate  1 tablet Oral Q12H  . aspirin  81 mg Oral Daily  . atorvastatin  40 mg Per Tube Daily  . carvedilol  3.125 mg Oral BID WC  . enoxaparin (LOVENOX) injection  40 mg Subcutaneous Q24H  . lisinopril  2.5 mg Oral Daily  . pneumococcal 23 valent vaccine  0.5 mL Intramuscular Tomorrow-1000   Continuous Infusions: . sodium chloride 10 mL/hr at 06/02/17 0922  . sodium chloride    . sodium chloride Stopped (06/06/17 1330)   PRN Meds: sodium chloride, acetaminophen, guaiFENesin-dextromethorphan, ibuprofen, ipratropium-albuterol, ondansetron (ZOFRAN) IV   Vital Signs    Vitals:   06/07/17 0036 06/07/17 0602 06/07/17 0743 06/07/17 0811  BP: 127/74 130/75 128/81 134/71  Pulse: 74 71 74 71  Resp: 20 20 18    Temp: 98.6 F (37 C) 97.7 F (36.5 C)    TempSrc: Oral Oral    SpO2: 96% 93% 92% 93%  Weight:  181 lb 1.6 oz (82.1 kg)    Height:        Intake/Output Summary (Last 24 hours) at 06/07/17 0816 Last data filed at 06/07/17 9373  Gross per 24 hour  Intake           406.67 ml  Output              670 ml  Net          -263.33 ml   Filed Weights   06/05/17 0701 06/06/17 0416 06/07/17 0602  Weight: 185 lb 6.4 oz (84.1 kg) 182 lb (82.6 kg) 181 lb 1.6 oz (82.1 kg)    Telemetry    SR 60's - Personally Reviewed  ECG    SR, 67bpm, personally reviewed  Physical Exam   GEN: No acute distress.   Neck: No JVD, Left IJ has been removed Cardiac: RRR, no murmurs, rubs, or gallops.  Respiratory: Clear to auscultation bilaterally. GI: Soft, nontender, non-distended  MS: No edema; No deformity. Neuro:  Nonfocal  Psych: Normal affect Mildly  anxious  ICD site is dry, no hematoma, minimal ecchymosis  Labs    Chemistry Recent Labs Lab 06/01/17 0742  06/03/17 0327 06/03/17 1408 06/06/17 1019 06/07/17 0640  NA 138  < > 136  --  140 139  K 3.9  < > 3.5  --  3.4* 3.3*  CL 104  < > 110  --  111 107  CO2 9*  < > 21*  --  22 24  GLUCOSE 438*  < > 117*  --  88 100*  BUN 7  < > 7  --  6 11  CREATININE 1.64*  < > 1.00 0.91 0.79 0.81  CALCIUM 8.8*  < > 7.5*  --  8.1* 8.3*  PROT 5.7*  --   --   --   --   --   ALBUMIN 3.3*  --   --   --   --   --   AST 210*  --   --   --   --   --   ALT 220*  --   --   --   --   --   ALKPHOS 83  --   --   --   --   --  BILITOT 0.8  --   --   --   --   --   GFRNONAA 43*  < > >60 >60 >60 >60  GFRAA 50*  < > >60 >60 >60 >60  ANIONGAP 25*  < > 5  --  7 8  < > = values in this interval not displayed.   Hematology  Recent Labs Lab 06/03/17 0327 06/03/17 1408 06/04/17 0327  WBC 14.1* 11.2* 10.6*  RBC 3.54* 3.41* 3.20*  HGB 11.0* 10.6* 9.9*  HCT 32.9* 31.6* 29.6*  MCV 92.9 92.7 92.5  MCH 31.1 31.1 30.9  MCHC 33.4 33.5 33.4  RDW 13.8 13.8 13.7  PLT 146* 148* 135*    Cardiac Enzymes  Recent Labs Lab 06/01/17 1538 06/02/17 0307  TROPONINI 1.34* 21.97*     Recent Labs Lab 06/01/17 0757 06/01/17 1041  TROPIPOC 0.03 0.65*     BNP  Recent Labs Lab 06/01/17 1326  BNP 22.6     DDimer No results for input(s): DDIMER in the last 168 hours.   Radiology    06/07/17 CXR IMPRESSION: 1. No pneumothorax. Well-positioned single lead left subclavian ICD 2. New small right pleural effusion . 3. Slightly increased bibasilar lung opacities, favor atelectasis  Cardiac Studies   2-D Doppler echocardiogram 06/01/2017: Study Conclusions - Left ventricle: The cavity size was normal. Wall thickness was normal. Systolic function was moderately to severely reduced. The estimated ejection fraction was in the range of 30% to 35%. There is akinesis of the mid-apicalanteroseptal  and apical myocardium. Doppler parameters are consistent with abnormal left ventricular relaxation (grade 1 diastolic dysfunction). Impressions: - Technically difficult; definity used; akinesis of the distal anteroseptal and apical walls with overall moderate to severe LV dysfunction; mild diastolic dysfunction; trace TR.  Cardiac catheterization 06/03/17: 1. Minor nonobstructive CAD 2. Moderate LV dysfunction. There a segmental wall motion abnormalities involving the mid ventricular segments and inferoapex. Consider Takotsubo syndrome. 3. Normal LVEDP.  Patient Profile     61 y.o. male with a hx of hypertension, hyperlipidemia, PAD with prior LLE intervention by Dr. Allyson Sabal, and tobacco usewho suffered ventricular fibrillation and was successfully resuscitated. Severe acidosis and hyperglycemia have been treated. Kidney function is norrmal, echo shows EF 30%, ECG abnormal, and enzymes c/w NSTEMI. Minor nonobstructive coronary artery disease on cardiac catheterization.  Assessment & Plan    Cardiac arrest-ventricular fibrillation, shocked multiple times, 20 minutes for return of spontaneous circulation-in ER able to follow commands.   - Post resuscitation, EF 30-35%, nonischemic cardiomyopathy  - s/p cardiac MRI, suspected to be myocarditis, management deferred to primary team  - s/p ICDyesterday with Dr. Graciela Husbands  - Dr. Graciela Husbands discussed with Dr. Allyson Sabal from a PVD standpoint, recommended ASA alone going forward without Plavix  - No driving 6 months, patient is aware  - On BB/ACE  ICD site is stable Device check this AM with stable measurements/function CXR this AM without ptx Wound care and activity restrictions, instructions were discussed with the patient He is OK to discharge from device/EP standpoint, f/u has been arranged  Chest wall pain  - Hurts when he coughs, post-CPR, x-rays showed no evidence of fracture   Smoker - counseled, he is receptive   EP service remains  available, please recall if needed.    Signed, Sheilah Pigeon, PA-C  06/07/2017, 8:16 AM    Seen and examined Instructions given  Ok to discharge from our point of view

## 2017-06-07 NOTE — Discharge Summary (Signed)
Discharge Summary    Patient ID: Rick Logan,  MRN: [id number], DOB/AGE: 06/03/1956 61 y.o.  Admit date: 06/01/2017 Discharge date: 06/07/2017  Primary Care Provider: Marjory Lies A Primary Cardiologist: Smith/Klein  Discharge Diagnoses    Principal Problem:   Ventricular fibrillation [facility]) Active Problems:   Syncope   Hyperlipidemia   Tobacco use   Essential hypertension   Cardiac arrest (HCC)   Non-ST elevation (NSTEMI) myocardial infarction (HCC)   Aspiration pneumonia (HCC)   Allergies No Known Allergies  Diagnostic Studies/Procedures   TTE: 06/01/17  Study Conclusions  - Left ventricle: The cavity size was normal. Wall thickness was   normal. Systolic function was moderately to severely reduced. The   estimated ejection fraction was in the range of 30% to 35%. There   is akinesis of the mid-apicalanteroseptal and apical myocardium.   Doppler parameters are consistent with abnormal left ventricular   relaxation (grade 1 diastolic dysfunction).  Impressions:  - Technically difficult; definity used; akinesis of the distal   anteroseptal and apical walls with overall moderate to severe LV   dysfunction; mild diastolic dysfunction; trace TR.   LHC: 06/03/17  Conclusion     Mid RCA lesion, 25 %stenosed.  Prox LAD to Mid LAD lesion, 20 %stenosed.  There is moderate left ventricular systolic dysfunction.  LV end diastolic pressure is normal.  The left ventricular ejection fraction is 35-45% by visual estimate.   1. Minor nonobstructive CAD 2. Moderate LV dysfunction. There a segmental wall motion abnormalities involving the mid ventricular segments and inferoapex. Consider Takotsubo syndrome. 3. Normal LVEDP.  Plan: EP evaluation for consideration of ICD  _____________   History of Present Illness     Rick Logan is a 61 y.o. male with a hx of Hypertension, hyperlipidemia, and tobacco use who presented to the ED with cardiac arrest.  Rick Logan 61 year old who was intubated / sedated and unable to give history. Emergency medical personnel and staff report that he was at work, went outside to smoke a cigarette and had syncope. EMS was summoned and initially had pulses. He subsequently had ventricular fibrillation, required 20 minutes of CPR including application of the [place] device. No known history of cardiac disease but no records or family were available in the emergency room. He was admitted to PCCM with systolic HR/cardiogenic shock with cardiology following.  Hospital Course     Consultants: PCCM, EP  He improved neurologically and was able to be weaned and extubated on 06/02/17. Was initially on pressors but weaned as tolerated via PCCM. Developed fever and elevated WBC. Started on IV vanco/zosyn, then transitioned to Augmentin for aspiration PNA. Troponin peaked at 21.97. Was able to undergo cardiac cath noted above with minimal nonobstructive CAD, but EF noted at 35-45% via cath. Echo showed EF of 30-35% with akinesis of the distal anteroseptal and apical wall. He was started on low dose coreg 3.125mg  BID with blood pressures tolerating. Did complain of chest wall pain felt to be 2/2 to CPR. CXR showed no fractures. EP was consulted in regards to possible ICD placement and felt he was an appropriate candidate. He underwent ICD placement with Dr. Graciela Husbands on 06/06/17 without complications. Cardiac MRI showed no effusion, with septal hypokinesis. Low dose ACEi was restarted 06/06/17. He was seen by EP and determined stable for discharge home the following day. Completed full course of antibiotics while inpatient per PCCM recommendations. Seen and worked well with cardiac rehab. LDL 151 and was started on Lipitor .  Of note he was on Plavix PTA for PVD, Dr. Graciela Husbands discussed with Dr. Allyson Sabal who agreed ok to stop Plavix and continue  ASA. He will resume his home benazepril at  daily instead of  with close monitoring of his  blood pressure.    Rick Logan was seen by Dr. Excell Seltzer and determined stable for discharge home. Follow up in the office has been arranged. Medications are listed below.   _____________  Discharge Vitals Blood pressure 129/71, pulse 72, temperature 97.7 F (36.5 C), temperature source Oral, resp. rate 18, height 5' 11.5" (1.816 m), weight 181 lb 1.6 oz (82.1 kg), SpO2 92 %.  Filed Weights   06/05/17 0701 06/06/17 0416 06/07/17 0602  Weight: 185 lb 6.4 oz (84.1 kg) 182 lb (82.6 kg) 181 lb 1.6 oz (82.1 kg)    Labs & Radiologic Studies    CBC No results for input(s): WBC, NEUTROABS, HGB, HCT, MCV, PLT in the last 72 hours. Basic Metabolic Panel  Recent Labs  06/06/17 1019 06/07/17 0640  NA 140 139  K 3.4* 3.3*  CL 111 107  CO2 22 24  GLUCOSE 88 100*  BUN 6 11  CREATININE 0.79 0.81  CALCIUM 8.1* 8.3*   Liver Function Tests No results for input(s): AST, ALT, ALKPHOS, BILITOT, PROT, ALBUMIN in the last 72 hours. No results for input(s): LIPASE, AMYLASE in the last 72 hours. Cardiac Enzymes No results for input(s): CKTOTAL, CKMB, CKMBINDEX, TROPONINI in the last 72 hours. BNP Invalid input(s): POCBNP D-Dimer No results for input(s): DDIMER in the last 72 hours. Hemoglobin A1C No results for input(s): HGBA1C in the last 72 hours. Fasting Lipid Panel No results for input(s): CHOL, HDL, LDLCALC, TRIG, CHOLHDL, LDLDIRECT in the last 72 hours. Thyroid Function Tests No results for input(s): TSH, T4TOTAL, T3FREE, THYROIDAB in the last 72 hours.  Invalid input(s): FREET3 _____________  Dg Eye Foreign Body  Result Date: 06/05/2017 CLINICAL DATA:  Metal working/exposure; clearance prior to MRI. EXAM: ORBITS FOR FOREIGN BODY - 2 VIEW COMPARISON:  None. FINDINGS: There is no evidence of metallic foreign body within the orbits. No significant bone abnormality identified. IMPRESSION: No evidence of metallic foreign body within the orbits. Electronically Signed   By: Rubye Oaks M.D.   On: 06/05/2017 18:43   Dg Chest 2 View  Result Date: 06/07/2017 CLINICAL DATA:  ICD placement EXAM: CHEST  2 VIEW COMPARISON:  06/02/2017 chest radiograph. FINDINGS: Single lead left subclavian ICD is noted with lead tip over the right ventricular apex. Stable cardiomediastinal silhouette with normal heart size. No pneumothorax. New small right pleural effusion. No left pleural effusion. No pulmonary edema. Bibasilar opacities appear slightly increased. IMPRESSION: 1. No pneumothorax. Well-positioned single lead left subclavian ICD . 2. New small right pleural effusion . 3. Slightly increased bibasilar lung opacities, favor atelectasis. Electronically Signed   By: Delbert Phenix M.D.   On: 06/07/2017 07:35   Dg Chest Port 1 View  Result Date: 06/02/2017 CLINICAL DATA:  Epigastric pain.  Patient status post CPR yesterday. EXAM: PORTABLE CHEST 1 VIEW COMPARISON:  Single-view of the chest 06/02/2017 and 06/01/2017. FINDINGS: The patient's endotracheal tube and NG tube have been removed. Left IJ catheter remains in place. Mild bibasilar atelectasis is seen. No pneumothorax or pleural effusion. Heart size is normal. No acute bony abnormality is identified. IMPRESSION: No acute disease. Bibasilar subsegmental atelectasis. Status post extubation and NG tube removal. Electronically Signed   By: Drusilla Kanner M.D.   On: 06/02/2017 10:25  Dg Chest Port 1 View  Result Date: 06/02/2017 CLINICAL DATA:  Advancement of the nasogastric tube by 10 cm is recommended to assure that the proximal port is positioned below the GE junction. EXAM: PORTABLE CHEST 1 VIEW COMPARISON:  Portable chest x-ray of June 01, 2017 FINDINGS: There is increased density at both bases greatest on the left. There is no pleural effusion or pneumothorax. The heart and pulmonary vascularity are normal. The endotracheal tube tip projects approximately 4.4 cm above the carina. The esophagogastric tube has its proximal port at the  level of the GE junction with the tip in the cardia. There is calcification in the wall of the aortic arch. The left internal jugular venous catheter tip projects over the proximal SVC. IMPRESSION: Interval development of bibasilar atelectasis or pneumonia greatest on the left. Advancement of the nasogastric tube by 5-10 cm is recommended to assure that the proximal port is positioned below the GE junction. Electronically Signed   By: David  [place] M.D.   On: 06/02/2017 07:16   Dg Chest Port 1 View  Result Date: 06/01/2017 CLINICAL DATA:  Central line placement EXAM: PORTABLE CHEST 1 VIEW COMPARISON:  06/01/2017 FINDINGS: Left internal jugular central line is in place with the tip at the confluence of the innominate veins. Endotracheal tube and NG tube are unchanged. No pneumothorax. Heart is normal size. No confluent airspace opacities or effusions. IMPRESSION: Left central line tip at the confluence of the innominate veins. No pneumothorax. No acute findings. Electronically Signed   By: Charlett Nose M.D.   On: 06/01/2017 11:57   Dg Chest Port 1 View  Result Date: 06/01/2017 CLINICAL DATA:  Cardiac arrest.  Endotracheal tube placement. EXAM: PORTABLE CHEST 1 VIEW COMPARISON:  None. FINDINGS: Endotracheal tube is in good position 5 cm above the carina. NG tube tip is below the diaphragm. Heart size and pulmonary vascularity are normal.  Lungs are clear. IMPRESSION: Tubes in good position.  Clear lungs. Electronically Signed   By: Francene Boyers M.D.   On: 06/01/2017 09:21   Mr Cardiac Morphology W Wo Contrast  Result Date: 06/06/2017 CLINICAL DATA:  Cardiac Arrest EXAM: CARDIAC MRI TECHNIQUE: The patient was scanned on a 1.5 Tesla GE magnet. A dedicated cardiac coil was used. Functional imaging was done using Fiesta sequences. 2,3, and 4 chamber views were done to assess for RWMA's. Modified Simpson's rule using a short axis stack was used to calculate an ejection fraction on a dedicated work Barrister's clerk. The patient received 38 cc cc of Multihance. After 10 minutes inversion recovery sequences were used to assess for infiltration and scar tissue. CONTRAST:  38 cc of Multihance FINDINGS: The atria were of normal size. The RV was normal in size and function The LV was mildly dilatated with septal hypokinesis. The quantitative EF was 44% (EDV 159, ESV 90 SV 70) There was no ASD/PFO/VSD There was no pericardial effusion The aortic valve and root appeared normal. The mitral and tricuspid valves were normal. Delayed enhancement images had lots of motion and breathing artifact. There appeared to be sub epicardial and mid myocardial uptake in the mid anterior and inferior walls which would represent non coronary distributions IMPRESSION: 1) Mild LVE EF 44% septal hypokinesis 2) Sub epicardial and mid myocardial gadolinium uptake in mid anterior and inferior walls non coronary distribution 3) Normal RV size and function 4) No significant valve disease 5) Normal atria 6) No pericardial effusion Charlton Haws Electronically Signed   By: [name]  Eden Emms M.D.   On: 06/06/2017 13:36   Disposition   Pt is being discharged home today in good condition.  Follow-up Plans & Appointments    Follow-up Information    [facility] [address] Office Follow up on 06/20/2017.   Specialty:  Cardiology Why:  3:00PM, wound check visit Contact information: [address], [address] [place] [address] [phone]       Duke Salvia, MD Follow up on 09/07/2017.   Specialty:  Cardiology Why:  11:30AM Contact information: [address] [place] [address] [phone]        Lyn Records, MD Follow up.   Specialty:  Cardiology Why:  the office will call you with follow up appt.  Contact information: [address] [place] [address] [phone]          Discharge Instructions    (HEART FAILURE PATIENTS) Call MD:  Anytime you  have any of the following symptoms: 1) 3 pound weight gain in 24 hours or 5 pounds in 1 week 2) shortness of breath, with or without a dry hacking cough 3) swelling in the hands, feet or stomach 4) if you have to sleep on extra pillows at night in order to breathe.    Complete by:  As directed    Amb Referral to Cardiac Rehabilitation    Complete by:  As directed    Diagnosis:  NSTEMI Comment - v fib arrest   Call MD for:  redness, tenderness, or signs of infection (pain, swelling, redness, odor or green/yellow discharge around incision site)    Complete by:  As directed    Diet - low sodium heart healthy    Complete by:  As directed    Discharge instructions    Complete by:  As directed    Radial Site Care Refer to this sheet in the next few weeks. These instructions provide you with information on caring for yourself after your procedure. Your caregiver may also give you more specific instructions. Your treatment has been planned according to current medical practices, but problems sometimes occur. Call your caregiver if you have any problems or questions after your procedure. HOME CARE INSTRUCTIONS You may shower the day after the procedure.Remove the bandage (dressing) and gently wash the site with plain soap and water.Gently pat the site dry.  Do not apply powder or lotion to the site.  Do not submerge the affected site in water for 3 to 5 days.  Inspect the site at least twice daily.  Do not flex or bend the affected arm for 24 hours.  No lifting over 5 pounds (2.3 kg) for 5 days after your procedure.  Do not drive home if you are discharged the same day of the procedure. Have someone else drive you.  You may drive 24 hours after the procedure unless otherwise instructed by your caregiver.  What to expect: Any bruising will usually fade within 1 to 2 weeks.  Blood that collects in the tissue (hematoma) may be painful to the touch. It should usually decrease in size and tenderness  within 1 to 2 weeks.  SEEK IMMEDIATE MEDICAL CARE IF: You have unusual pain at the radial site.  You have redness, warmth, swelling, or pain at the radial site.  You have drainage (other than a small amount of blood on the dressing).  You have chills.  You have a fever or persistent symptoms for more than 72 hours.  You have a fever and your symptoms suddenly get worse.  Your arm becomes pale, cool, tingly, or numb.  You have heavy bleeding from the site. Hold pressure on the site.   Increase activity slowly    Complete by:  As directed       Discharge Medications     Medication List    STOP taking these medications   clopidogrel 75 MG tablet Commonly known as:  PLAVIX     TAKE these medications   aspirin EC 81 MG tablet Take 81 mg by mouth daily.   atorvastatin 40 MG tablet Commonly known as:  LIPITOR Take 40 mg by mouth daily.   benazepril 40 MG tablet Commonly known as:  LOTENSIN Take 0.5 tablets (20 mg total) by mouth daily. What changed:  how much to take   carvedilol 3.125 MG tablet Commonly known as:  COREG Take 1 tablet (3.125 mg total) by mouth 2 (two) times daily with a meal.         Outstanding Labs/Studies   N/a  Duration of Discharge Encounter   Greater than 30 minutes including physician time.  Signed, Laverda Page NP-C 06/07/2017, 3:10 PM  Patient seen, examined. Available data reviewed. Agree with findings, assessment, and plan as outlined by Laverda Page, NP-C. On my exam today: Vitals:   06/07/17 0811 06/07/17 1100  BP: 134/71 129/71  Pulse: 71 72  Resp:    Temp:    SpO2: 93% 92%   Pt is alert and oriented, NAD HEENT: normal Neck: JVP - normal Lungs: coarse bilaterally CV: RRR without murmur or gallop Chest: ICD site clear Abd: soft, NT, Positive BS, no hepatomegaly Ext: no C/C/E, distal pulses intact and equal Skin: warm/dry no rash  The patient has been seen by the EP team today. He appears stable for hospital  discharge. His medications are reviewed and are appropriate. He seems to be tolerating low doses of carvedilol and lisinopril. All questions are answered.  Tonny Bollman, M.D. 06/07/2017 3:10 PM

## 2017-06-08 ENCOUNTER — Encounter: Payer: Self-pay | Admitting: Vascular Surgery

## 2017-06-08 MED FILL — Heparin Sodium (Porcine) 2 Unit/ML in Sodium Chloride 0.9%: INTRAMUSCULAR | Qty: 500 | Status: AC

## 2017-06-20 ENCOUNTER — Ambulatory Visit (INDEPENDENT_AMBULATORY_CARE_PROVIDER_SITE_OTHER): Payer: 59 | Admitting: *Deleted

## 2017-06-20 ENCOUNTER — Telehealth (HOSPITAL_COMMUNITY): Payer: Self-pay

## 2017-06-20 DIAGNOSIS — I4901 Ventricular fibrillation: Secondary | ICD-10-CM | POA: Diagnosis not present

## 2017-06-20 LAB — CUP PACEART INCLINIC DEVICE CHECK
Battery Remaining Longevity: 137 mo
Battery Voltage: 3.16 V
Brady Statistic RV Percent Paced: 0.01 %
Date Time Interrogation Session: 20180924155148
HIGH POWER IMPEDANCE MEASURED VALUE: 65 Ohm
Implantable Lead Implant Date: 20180910
Implantable Lead Location: 753860
Lead Channel Impedance Value: 456 Ohm
Lead Channel Impedance Value: 532 Ohm
Lead Channel Pacing Threshold Amplitude: 0.5 V
Lead Channel Sensing Intrinsic Amplitude: 8.5 mV
MDC IDC MSMT LEADCHNL RV PACING THRESHOLD PULSEWIDTH: 0.4 ms
MDC IDC MSMT LEADCHNL RV SENSING INTR AMPL: 10.25 mV
MDC IDC PG IMPLANT DT: 20180910
MDC IDC SET LEADCHNL RV PACING AMPLITUDE: 3.5 V
MDC IDC SET LEADCHNL RV PACING PULSEWIDTH: 0.4 ms
MDC IDC SET LEADCHNL RV SENSING SENSITIVITY: 0.3 mV

## 2017-06-20 NOTE — Progress Notes (Signed)
Wound check appointment. Dermabond removed. Wound without redness or edema. Incision edges approximated, wound well healed. Normal device function. Threshold, sensing, and impedance consistent with implant measurements. Device programmed at 3.5V for extra safety margin until 3 month visit. Histogram distribution appropriate for patient and level of activity. no ventricular arrhythmias noted. Patient educated about wound care, arm mobility, lifting restrictions, shock plan. ROV with SK 12/12

## 2017-06-20 NOTE — Telephone Encounter (Signed)
Patient insurance is active and benefits verified. Patient insurance is [employer] - no co-payment, deductible $1000/$1000 has been met, out of pocket $3200/$1717.50 has been met, 10% co-insurance and no pre-authorization. Passport/reference [phone]. No pre-authorization per [facility] automated for CPT [phone] on 06/14/17 - reference # [id number].  Patient will be contacted and scheduled after their follow up appointment with the cardiologist on 07/27/17, upon review by [facility] RN navigator.

## 2017-06-21 ENCOUNTER — Telehealth: Payer: Self-pay | Admitting: *Deleted

## 2017-06-21 NOTE — Telephone Encounter (Signed)
-----   Message from Verner Chol, RN sent at 06/20/2017  3:54 PM EDT ----- Patient was seen in [place] for wound check appointment - he is asking if he can be "written out of work" for the six months of his driving restrictions.

## 2017-06-21 NOTE — Telephone Encounter (Signed)
Patient with a history of cardiac arrest on 06/01/17 & ICD implant on 06/06/17.  I called and spoke with the patient to find out details from his job. 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 He states this is a 17 mile [address] 1 way to work. He does work full time (7am-3:30 pm). I advised him I will review with Dr. Graciela Husbands and call him back. He is agreeable.

## 2017-06-22 NOTE — Telephone Encounter (Signed)
We can certainly write a note taht says no driving per [place] laws for 6 mon

## 2017-06-24 NOTE — Telephone Encounter (Signed)
Follow Up:; ° ° °Returning your call. °

## 2017-06-24 NOTE — Telephone Encounter (Signed)
I left a message for the patient to call. 

## 2017-06-24 NOTE — Telephone Encounter (Signed)
I spoke with the patient. He is aware that per Dr. Graciela Husbands, we can only write him a letter that he has driving restrictions for 6 months, but not that we can keep him out of work. He states he can't work if he can't drive to get there. I advised him again that the only restriction we can write for him is no driving x 6 months. He states he is due to meet with his job next week. I have advised him if there is any paperwork his company has for [place] to fill out, he can send it to [place].  He voices understanding.

## 2017-07-27 ENCOUNTER — Encounter: Payer: Self-pay | Admitting: Interventional Cardiology

## 2017-07-27 ENCOUNTER — Ambulatory Visit (INDEPENDENT_AMBULATORY_CARE_PROVIDER_SITE_OTHER): Payer: 59 | Admitting: Interventional Cardiology

## 2017-07-27 VITALS — BP 126/70 | HR 57 | Ht 71.5 in | Wt 185.2 lb

## 2017-07-27 DIAGNOSIS — Z720 Tobacco use: Secondary | ICD-10-CM

## 2017-07-27 DIAGNOSIS — I5020 Unspecified systolic (congestive) heart failure: Secondary | ICD-10-CM | POA: Diagnosis not present

## 2017-07-27 DIAGNOSIS — I739 Peripheral vascular disease, unspecified: Secondary | ICD-10-CM

## 2017-07-27 DIAGNOSIS — I10 Essential (primary) hypertension: Secondary | ICD-10-CM | POA: Diagnosis not present

## 2017-07-27 DIAGNOSIS — I469 Cardiac arrest, cause unspecified: Secondary | ICD-10-CM | POA: Diagnosis not present

## 2017-07-27 NOTE — Progress Notes (Signed)
Cardiology Office Note    Date:  07/27/2017   ID:  Rick Logan, DOB [birth year], MRN [id number]  PCP:  [name]  Cardiologist: Lesleigh Noe, MD   Chief Complaint  Patient presents with  . Follow-up    Cardiac arrest    History of Present Illness:  Rick Logan is a 61 y.o. male with a hx of Hypertension, hyperlipidemia, and tobacco use who is being seen today for the evaluation of cardiac arrest due to ventricular fibrillation.  On September 5 patient suffered cardiac arrest due to ventricular fibrillation.  He was successfully resuscitated.  Acute evaluation demonstrated LVEF 30-35%.  He recovered without any evidence of neurological injury.  Cardiac catheterization did not reveal significant obstructive disease.  Because of the circumstance, he underwent AICD implantation by Dr. Graciela Husbands.  He is not return to work.  He has been advised not to drive for at least 6 months.  He has had no recurrence of arrhythmia and is not received a discharge from his AICD.  He desires to return to work. All right thanks  Past Medical History:  Diagnosis Date  . Arthritis    "joints get sore at times" (08/28/2015)  . Cardiac arrest (HCC)    Vfib   . Claudication of calf muscles (HCC)    left lower extremity  . Essential hypertension   . Hyperlipidemia   . Hypertension   . PVD (peripheral vascular disease) (HCC)   . Smoking   . Systolic heart failure (HCC)    EF 30-35%  . Tobacco abuse     Past Surgical History:  Procedure Laterality Date  . ABDOMINAL SURGERY     No details available  . APPENDECTOMY    . COLON SURGERY    . EXPLORATORY LAPAROTOMY  1970's X 2   "part of small intestines; part of my bowels; appendix; 2nd OR was for adhesions"  . ICD IMPLANT N/A 06/06/2017   Procedure: ICD Implant;  Surgeon: Duke Salvia, MD;  Location: [facility] INVASIVE CV LAB;  Service: Cardiovascular;  Laterality: N/A;  . LEFT HEART CATH AND CORONARY  ANGIOGRAPHY N/A 06/03/2017   Procedure: LEFT HEART CATH AND CORONARY ANGIOGRAPHY;  Surgeon: [place], Peter M, MD;  Location: [facility] INVASIVE CV LAB;  Service: Cardiovascular;  Laterality: N/A;  . PERIPHERAL VASCULAR CATHETERIZATION N/A 08/28/2015   Procedure: Lower Extremity Angiography;  Surgeon: Runell Gess, MD;  Location: [facility] INVASIVE CV LAB;  Service: Cardiovascular;  Laterality: N/A;  . TONSILLECTOMY      Current Medications: Outpatient Medications Prior to Visit  Medication Sig Dispense Refill  . aspirin EC 81 MG tablet Take 81 mg by mouth daily.    [name] atorvastatin (LIPITOR) 40 MG tablet Take 40 mg by mouth daily.    . benazepril (LOTENSIN) 40 MG tablet Take 0.5 tablets (20 mg total) by mouth daily. 30 tablet 2  . carvedilol (COREG) 3.125 MG tablet Take 1 tablet (3.125 mg total) by mouth 2 (two) times daily with a meal. 60 tablet 2  . aspirin EC 81 MG tablet Take 81 mg by mouth daily.    [name] atorvastatin (LIPITOR) 40 MG tablet     . benazepril (LOTENSIN) 40 MG tablet Take 40 mg by mouth daily.  0  . clopidogrel (PLAVIX) 75 MG tablet TAKE 1 TABLET (75 MG TOTAL) BY MOUTH DAILY. (Patient not taking: Reported on 06/20/2017) 90 tablet 3  . gabapentin (NEURONTIN) 300 MG capsule Take 300 mg by mouth.    [name]  meloxicam (MOBIC) 15 MG tablet Take 15 mg by mouth.     No facility-administered medications prior to visit.      Allergies:   Patient has no known allergies.   Social History   Social History  . Marital status: Married    Spouse name: N/A  . Number of children: N/A  . Years of education: N/A   Social History Main Topics  . Smoking status: Former Smoker    Packs/day: 1.00    Years: 41.00    Types: Cigarettes    Quit date: 06/01/2017  . Smokeless tobacco: Never Used  . Alcohol use No  . Drug use: No  . Sexual activity: Not Currently   Other Topics Concern  . None   Social History Narrative   ** Merged History Encounter **         Family History:  The patient's family history  includes Cancer in his father.   ROS:   Please see the history of present illness.    Continues to have chest wall pain related to rib fractures from CPR.  He seems to be somewhat anxious about his future with respect to work.  He is aggravated by the limitation on driving. All other systems reviewed and are negative.   PHYSICAL EXAM:   VS:  BP 126/70 (BP Location: Left Arm)   Pulse (!) 57   Ht 5' 11.5" (1.816 m)   Wt 185 lb 3.2 oz (84 kg)   BMI 25.47 kg/m    GEN: Well nourished, well developed, in no acute distress  HEENT: normal  Neck: no JVD, carotid bruits, or masses Cardiac: RRR; no murmurs, rubs, or gallops,no edema  Respiratory:  clear to auscultation bilaterally, normal work of breathing GI: soft, nontender, nondistended, + BS MS: no deformity or atrophy  Skin: warm and dry, no rash Neuro:  Alert and Oriented x 3, Strength and sensation are intact Psych: euthymic mood, full affect  Wt Readings from Last 3 Encounters:  07/27/17 185 lb 3.2 oz (84 kg)  06/07/17 181 lb 1.6 oz (82.1 kg)  11/26/16 188 lb (85.3 kg)      Studies/Labs Reviewed:   EKG:  EKG not performed  Recent Labs: 06/01/2017: ALT 220; B Natriuretic Peptide 22.6 06/03/2017: Magnesium 2.5 06/04/2017: Hemoglobin 9.9; Platelets 135 06/07/2017: BUN 11; Creatinine, Ser 0.81; Potassium 3.3; Sodium 139   Lipid Panel    Component Value Date/Time   TRIG 151 (H) 06/02/2017 0307    Additional studies/ records that were reviewed today include:  Cardiac catheterization June 03, 2017: Coronary Diagrams  Diagnostic Diagram        Conclusion     Mid RCA lesion, 25 %stenosed.  Prox LAD to Mid LAD lesion, 20 %stenosed.  There is moderate left ventricular systolic dysfunction.  LV end diastolic pressure is normal.  The left ventricular ejection fraction is 35-45% by visual estimate.   1. Minor nonobstructive CAD 2. Moderate LV dysfunction. There a segmental wall motion abnormalities involving the mid  ventricular segments and inferoapex. Consider Takotsubo syndrome. 3. Normal LVEDP.   Plan: EP evaluation for consideration of ICD   ECHOCARDIOGRAM 06/01/2017 Study Conclusions   - Left ventricle: The cavity size was normal. Wall thickness was   normal. Systolic function was moderately to severely reduced. The   estimated ejection fraction was in the range of 30% to 35%. There   is akinesis of the mid-apicalanteroseptal and apical myocardium.   Doppler parameters are consistent with abnormal left ventricular  relaxation (grade 1 diastolic dysfunction).   Impressions:   - Technically difficult; definity used; akinesis of the distal   anteroseptal and apical walls with overall moderate to severe LV   dysfunction; mild diastolic dysfunction; trace TR.     ASSESSMENT:    1. Cardiac arrest (HCC)   2. Essential hypertension   3. Systolic heart failure, unspecified HF chronicity (HCC)   4. PVD (peripheral vascular disease) (HCC)   5. Tobacco use      PLAN:  In order of problems listed above:  1. This was felt to be due to ventricular fibrillation.  The patient was found to have severely decreased LV function but is uncertain if it was primary or secondary to prolonged CPR.  Will need to have an echocardiogram performed prior to follow-up in December.  He is currently able to return to work now nearly 2 months out from cardiac arrest.  Still has restrictions on driving until March [id number]. 2. Excellent blood pressure on the current medical regimen which includes carvedilol 3.125 mg twice daily and benazepril 20 mg/day. 3. EF in the 30-35% when evaluated acutely.  Needs follow-up echo done before year in assess if LV has sufficiently recovered. 4. Not addressEed 5. I encouraged smoking cessation.  Return to work.  Restrict driving automobiles and heavy equipment.  Avoid ladders.  Continue medication as listed.  Repeat echo on return prior to device evaluation to document recovery of LV  function.  Continue ACE inhibitor and beta-blocker therapy.  Extended office visit with greater than 50% of the time spent in counseling and coordination of care.  Multiple forms had to be completed patient to be able to return to work.  Medication Adjustments/Labs and Tests Ordered: Current medicines are reviewed at length with the patient today.  Concerns regarding medicines are outlined above.  Medication changes, Labs and Tests ordered today are listed in the Patient Instructions below. Patient Instructions  Medication Instructions:  Your physician recommends that you continue on your current medications as directed. Please refer to the Current Medication list given to you today.   Labwork: none  Testing/Procedures: Your physician has requested that you have an echocardiogram. Echocardiography is a painless test that uses sound waves to create images of your heart. It provides your doctor with information about the size and shape of your heart and how well your heart's chambers and valves are working. This procedure takes approximately one hour. There are no restrictions for this procedure.  To be done end of November or early December  Follow-Up: Your physician wants you to follow-up in: 4-6 months with Dr. Katrinka BlazingSmith.  You will receive a reminder letter in the mail two months in advance. If you don't receive a letter, please call our office to schedule the follow-up appointment.   Any Other Special Instructions Will Be Listed Below (If Applicable).     If you need a refill on your cardiac medications before your next appointment, please call your pharmacy.      Signed, Lesleigh NoeHenry W Kieon Lawhorn III, MD  07/27/2017 5:13 PM    [facility] [address], [place], [place]  [address] Phone: [phone]; Fax: [phone]

## 2017-07-27 NOTE — Patient Instructions (Signed)
Medication Instructions:  Your physician recommends that you continue on your current medications as directed. Please refer to the Current Medication list given to you today.   Labwork: none  Testing/Procedures: Your physician has requested that you have an echocardiogram. Echocardiography is a painless test that uses sound waves to create images of your heart. It provides your doctor with information about the size and shape of your heart and how well your heart's chambers and valves are working. This procedure takes approximately one hour. There are no restrictions for this procedure.  To be done end of November or early December  Follow-Up: Your physician wants you to follow-up in: 4-6 months with Dr. Katrinka Blazing.  You will receive a reminder letter in the mail two months in advance. If you don't receive a letter, please call our office to schedule the follow-up appointment.   Any Other Special Instructions Will Be Listed Below (If Applicable).     If you need a refill on your cardiac medications before your next appointment, please call your pharmacy.

## 2017-07-28 ENCOUNTER — Telehealth (HOSPITAL_COMMUNITY): Payer: Self-pay

## 2017-07-28 NOTE — Telephone Encounter (Signed)
Called and spoke with patient in regards to Cardiac Rehab - Patient stated he is exercising at home and is doing well so he does not want to participate in program. Closed referral.

## 2017-08-30 ENCOUNTER — Other Ambulatory Visit: Payer: Self-pay | Admitting: Interventional Cardiology

## 2017-08-30 ENCOUNTER — Ambulatory Visit (HOSPITAL_COMMUNITY): Payer: 59 | Attending: Cardiology

## 2017-08-30 ENCOUNTER — Other Ambulatory Visit: Payer: Self-pay

## 2017-08-30 DIAGNOSIS — R55 Syncope and collapse: Secondary | ICD-10-CM | POA: Diagnosis not present

## 2017-08-30 DIAGNOSIS — E785 Hyperlipidemia, unspecified: Secondary | ICD-10-CM | POA: Insufficient documentation

## 2017-08-30 DIAGNOSIS — I4901 Ventricular fibrillation: Secondary | ICD-10-CM | POA: Diagnosis not present

## 2017-08-30 DIAGNOSIS — I5020 Unspecified systolic (congestive) heart failure: Secondary | ICD-10-CM

## 2017-08-30 DIAGNOSIS — I110 Hypertensive heart disease with heart failure: Secondary | ICD-10-CM | POA: Diagnosis not present

## 2017-09-01 ENCOUNTER — Other Ambulatory Visit: Payer: Self-pay | Admitting: Cardiology

## 2017-09-07 ENCOUNTER — Ambulatory Visit (INDEPENDENT_AMBULATORY_CARE_PROVIDER_SITE_OTHER): Payer: 59 | Admitting: Internal Medicine

## 2017-09-07 VITALS — BP 116/62 | HR 66 | Resp 16 | Ht 71.5 in | Wt 192.2 lb

## 2017-09-07 DIAGNOSIS — I5020 Unspecified systolic (congestive) heart failure: Secondary | ICD-10-CM | POA: Diagnosis not present

## 2017-09-07 DIAGNOSIS — I4901 Ventricular fibrillation: Secondary | ICD-10-CM

## 2017-09-07 DIAGNOSIS — I469 Cardiac arrest, cause unspecified: Secondary | ICD-10-CM | POA: Diagnosis not present

## 2017-09-07 DIAGNOSIS — Z95810 Presence of automatic (implantable) cardiac defibrillator: Secondary | ICD-10-CM

## 2017-09-07 MED ORDER — ATORVASTATIN CALCIUM 40 MG PO TABS: 40.0000 mg | ORAL_TABLET | Freq: Every day | ORAL | 2 refills | Status: DC

## 2017-09-07 MED ORDER — BENAZEPRIL HCL 40 MG PO TABS: 20.0000 mg | ORAL_TABLET | Freq: Every day | ORAL | 2 refills | Status: DC

## 2017-09-07 NOTE — Progress Notes (Signed)
Patient Care Team: [name] as PCP - General (Family Medicine) Delorse Lek, MD (Family Medicine)   HPI  Rick Logan is a 61 y.o. male Seen with a chief complaint of aborted cardiac arrest.  Cardiac arrest occurred 06/01/17.  He was found by EMS to be in VF.  Shocked multiple times.  No coronary disease at catheterization; echo demonstrated an anterolateral apical wall motion abnormality and the possibility of Tako-Tsubo was raised.  However, review of old ECGs have demonstrated low voltage and Q waves suggesting a more long-standing cardiomyopathy.  Ejection fraction was 30-35%.  MRI had demonstrated subepicardial and mid myocardial gadolinium uptake not in a pattern consistent with coronary disease.  For secondary prevention, he received a Medtronic ICD.  He has some orthostatic lightheadedness.  Denies chest pain or shortness of breath.  There is no peripheral edema.  He has stopped smoking. \ Echocardiogram 12/18 EF normal Records and Results Reviewed Dr. Benay Spice notes related to claudication 2016.  Patient and her describe this is a clot; rather it was an obstruction relieved by atherectomy  Past Medical History:  Diagnosis Date  . Arthritis    "joints get sore at times" (08/28/2015)  . Cardiac arrest (HCC)    Vfib   . Claudication of calf muscles (HCC)    left lower extremity  . Essential hypertension   . Hyperlipidemia   . Hypertension   . PVD (peripheral vascular disease) (HCC)   . Smoking   . Systolic heart failure (HCC)    EF 30-35%  . Tobacco abuse     Past Surgical History:  Procedure Laterality Date  . ABDOMINAL SURGERY     No details available  . APPENDECTOMY    . COLON SURGERY    . EXPLORATORY LAPAROTOMY  1970's X 2   "part of small intestines; part of my bowels; appendix; 2nd OR was for adhesions"  . ICD IMPLANT N/A 06/06/2017   Procedure: ICD Implant;  Surgeon: Duke Salvia, MD;  Location: [facility]  INVASIVE CV LAB;  Service: Cardiovascular;  Laterality: N/A;  . LEFT HEART CATH AND CORONARY ANGIOGRAPHY N/A 06/03/2017   Procedure: LEFT HEART CATH AND CORONARY ANGIOGRAPHY;  Surgeon: Swazil[place], Peter M, MD;  Location: [facility] INVASIVE CV LAB;  Service: Cardiovascular;  Laterality: N/A;  . PERIPHERAL VASCULAR CATHETERIZATION N/A 08/28/2015   Procedure: Lower Extremity Angiography;  Surgeon: Runell Gess, MD;  Location: [facility] INVASIVE CV LAB;  Service: Cardiovascular;  Laterality: N/A;  . TONSILLECTOMY      Current Outpatient Medications  Medication Sig Dispense Refill  . aspirin EC 81 MG tablet Take 81 mg by mouth daily.    [name] atorvastatin (LIPITOR) 40 MG tablet Take 40 mg by mouth daily.    . benazepril (LOTENSIN) 40 MG tablet Take 0.5 tablets (20 mg total) by mouth daily. 30 tablet 2  . carvedilol (COREG) 3.125 MG tablet TAKE 1 TABLET BY MOUTH TWICE A DAY WITH A MEAL 180 tablet 3   No current facility-administered medications for this visit.     No Known Allergies    Review of Systems negative except from HPI and PMH  Physical Exam BP 116/62   Pulse 66   Resp 16   Ht 5' 11.5" (1.816 m)   Wt 192 lb 3.2 oz (87.2 kg)   SpO2 97%   BMI 26.43 kg/m  Well developed and nourished in no acute distress HENT normal Neck supple with JVP-flat Clear Device pocket  well healed; without hematoma or erythema.  There is no tethering  Regular rate and rhythm, no murmurs or gallops Abd-soft with active BS No Clubbing cyanosis edema Skin-warm and dry A & Oriented  Grossly normal sensory and motor function  ECG demonstrates sinus rhythm at 69 Interval 15/09/39 Inferior Q waves Nonspecific ST-T changes V2-V4 Low voltage I did   Assessment and  Plan  Cardiac arrest aborted  Cardiomyopathy question mechanism with abnormal gadolinium enhancement now resolved LV dysfunction  Orthostatic lightheadedness  Peripheral vascular disease    No interval arrhythmias.   I suspect his  lightheadedness is orthostatic; however, we will reprogram his ICD to a monitor zone.  Recent data suggests that in patients with heart failure and recovered ejection fraction at ongoing therapy with beta-blockers and ACE inhibitors is appropriate.  We will however, given his orthostatic lightheadedness and relatively low blood pressure, switch his ACE inhibitor from daytime to nighttime.  Given his peripheral vascular disease we will continue aspirin and statins  He is not driving x3 more months       Current medicines are reviewed at length with the patient today .  The patient does not  have concerns regarding medicines.

## 2017-09-07 NOTE — Patient Instructions (Addendum)
Medication Instructions: Your physician has recommended you make the following change in your medication:  -1) Take your Benazepril at night   Labwork: None Ordered  Procedures/Testing: None Ordered  Follow-Up: Your physician wants you to follow-up in: 9 MONTHS with Rick Norton, PA-C You will receive a reminder letter in the mail two months in advance. If you don't receive a letter, please call our office to schedule the follow-up appointment.  Remote monitoring is used to monitor your ICD from home. This monitoring reduces the number of office visits required to check your device to one time per year. It allows [place] to keep an eye on the functioning of your device to ensure it is working properly. You are scheduled for a device check from home on 12/07/16. You may send your transmission at any time that day. If you have a wireless device, the transmission will be sent automatically. After your physician reviews your transmission, you will receive a postcard with your next transmission date.   If you need a refill on your cardiac medications before your next appointment, please call your pharmacy.

## 2017-09-22 LAB — CUP PACEART INCLINIC DEVICE CHECK
Battery Voltage: 3.12 V
Brady Statistic RV Percent Paced: 0.04 %
Date Time Interrogation Session: 20181212182443
HighPow Impedance: 76 Ohm
Lead Channel Impedance Value: 475 Ohm
Lead Channel Sensing Intrinsic Amplitude: 8.125 mV
Lead Channel Setting Pacing Amplitude: 2.5 V
Lead Channel Setting Pacing Pulse Width: 0.4 ms
Lead Channel Setting Sensing Sensitivity: 0.3 mV
MDC IDC LEAD IMPLANT DT: 20180910
MDC IDC LEAD LOCATION: 753860
MDC IDC MSMT BATTERY REMAINING LONGEVITY: 136 mo
MDC IDC MSMT LEADCHNL RV IMPEDANCE VALUE: 551 Ohm
MDC IDC MSMT LEADCHNL RV PACING THRESHOLD AMPLITUDE: 0.5 V
MDC IDC MSMT LEADCHNL RV PACING THRESHOLD PULSEWIDTH: 0.4 ms
MDC IDC PG IMPLANT DT: 20180910

## 2017-10-10 ENCOUNTER — Telehealth: Payer: Self-pay | Admitting: Interventional Cardiology

## 2017-10-10 NOTE — Telephone Encounter (Signed)
New message    Patient calling back. BP at 11:00 is 140/100 Patient agreed to send remote transmission after 4pm today.

## 2017-10-10 NOTE — Telephone Encounter (Signed)
BP same as earlier this am.  Pt was instructed by previous RN Antonietta Breach to take benazepril now instead of tonight at bedtime but he is at work, also does not want to take it early, just wanted to know what his device is showing.

## 2017-10-10 NOTE — Telephone Encounter (Signed)
Called pt got disconnected mid-call, called pt back and LVM with device clinic direct number to call back

## 2017-10-10 NOTE — Telephone Encounter (Signed)
See previous telephone encounter from today's date.

## 2017-10-10 NOTE — Telephone Encounter (Signed)
New message    Patient calling with concerns of dizziness. Please  call  STAT if patient feels like he/she is going to faint   1) Are you dizzy now? yes  2) Do you feel faint or have you passed out? no  Do you have any other symptoms? No 3) Have you checked your HR and BP (record if available)? 140/100

## 2017-10-10 NOTE — Telephone Encounter (Signed)
Transmission received no episodes.  °

## 2017-10-10 NOTE — Telephone Encounter (Signed)
Called patient about his message. Patient complaining of his BP being elevated and some dizziness. Patient stated that at his last office visit there were some changes made to his device, and patient wants to know what his device is doing. Will forward to device clinic to help.

## 2017-10-10 NOTE — Telephone Encounter (Signed)
[place], Rick Logan at 10/10/2017 11:30 AM        Patient calling back. BP at 11:00 is 140/100 Patient agreed to send remote transmission after 4pm today.

## 2017-10-10 NOTE — Telephone Encounter (Signed)
Pt called back, pt is at work and will send transmission once he gets home, pt stated that he was going back to see the nurse at his work to have his BP re-checked.

## 2017-10-10 NOTE — Telephone Encounter (Signed)
Left detailed message for patient. Informed patient that his transmission was received, no episodes. Encouraged patient to call back with any questions.

## 2017-10-13 ENCOUNTER — Ambulatory Visit: Payer: 59 | Admitting: Interventional Cardiology

## 2017-11-30 ENCOUNTER — Encounter: Payer: Self-pay | Admitting: Interventional Cardiology

## 2017-12-05 NOTE — Progress Notes (Signed)
Cardiology Office Note    Date:  12/06/2017   ID:  Rick Logan, DOB 21-Oct-1955, MRN [id number]  PCP:  [facility]  Cardiologist: Sinclair Grooms, MD   Chief Complaint  Patient presents with  . Follow-up    Cardiac arrest    History of Present Illness:  Rick Logan is a 62 y.o. male  with a hx of Hypertension, hyperlipidemia, and tobacco use who is being seen today for the evaluation of cardiac arrest due to ventricular fibrillation.  Also has known bilateral carotid disease.  Subsequently had AICD placement.  Other than episodes of dizziness, he has not had syncope, prolonged palpitation, chest pain, or medication side effects.  He denies orthopnea PND.  Unfortunately he has been relieved of his responsibility at his job.   Past Medical History:  Diagnosis Date  . Arthritis    "joints get sore at times" (08/28/2015)  . Cardiac arrest ([place])    Vfib   . Claudication of calf muscles (HCC)    left lower extremity  . Essential hypertension   . Hyperlipidemia   . Hypertension   . PVD (peripheral vascular disease) ([place])   . Smoking   . Systolic heart failure (HCC)    EF 30-35%  . Tobacco abuse     Past Surgical History:  Procedure Laterality Date  . ABDOMINAL SURGERY     No details available  . APPENDECTOMY    . COLON SURGERY    . EXPLORATORY LAPAROTOMY  1970's X 2   "part of small intestines; part of my bowels; appendix; 2nd OR was for adhesions"  . ICD IMPLANT N/A 06/06/2017   Procedure: ICD Implant;  Surgeon: Deboraha Sprang, MD;  Location: [facility];  Service: Cardiovascular;  Laterality: N/A;  . LEFT HEART CATH AND CORONARY ANGIOGRAPHY N/A 06/03/2017   Procedure: LEFT HEART CATH AND CORONARY ANGIOGRAPHY;  Surgeon: Martinique, Peter M, MD;  Location: [facility];  Service: Cardiovascular;  Laterality: N/A;  . PERIPHERAL VASCULAR CATHETERIZATION N/A 08/28/2015   Procedure: Lower Extremity Angiography;  Surgeon:  Lorretta Harp, MD;  Location: [facility];  Service: Cardiovascular;  Laterality: N/A;  . TONSILLECTOMY      Current Medications: Outpatient Medications Prior to Visit  Medication Sig Dispense Refill  . aspirin EC 81 MG tablet Take 81 mg by mouth daily.    [name] atorvastatin (LIPITOR) 40 MG tablet Take 1 tablet (40 mg total) by mouth daily. 90 tablet 2  . benazepril (LOTENSIN) 40 MG tablet Take 0.5 tablets (20 mg total) by mouth at bedtime. 90 tablet 2  . carvedilol (COREG) 3.125 MG tablet TAKE 1 TABLET BY MOUTH TWICE A DAY WITH A MEAL 180 tablet 3   No facility-administered medications prior to visit.      Allergies:   Patient has no known allergies.   Social History   Socioeconomic History  . Marital status: Married    Spouse name: None  . Number of children: None  . Years of education: None  . Highest education level: None  Social Needs  . Financial resource strain: None  . Food insecurity - worry: None  . Food insecurity - inability: None  . Transportation needs - medical: None  . Transportation needs - non-medical: None  Occupational History  . None  Tobacco Use  . Smoking status: Former Smoker    Packs/day: 1.00    Years: 41.00    Pack years: 41.00  Types: Cigarettes    Last attempt to quit: 06/01/2017    Years since quitting: 0.5  . Smokeless tobacco: Never Used  Substance and Sexual Activity  . Alcohol use: No  . Drug use: No  . Sexual activity: Not Currently  Other Topics Concern  . None  Social History Narrative   ** Merged History Encounter **         Family History:  The patient's family history includes Cancer in his father.   ROS:   Please see the history of present illness.    Has been relieved of his job responsibilities.  Some episodes of dizziness.  No syncope.  Easy bruising. All other systems reviewed and are negative.   PHYSICAL EXAM:   VS:  BP 124/90   Pulse 66   Ht 5' 11.5" (1.816 m)   Wt 196 lb 6.4 oz (89.1 kg)   BMI  27.01 kg/m    GEN: Well nourished, well developed, in no acute distress  HEENT: normal  Neck: no JVD, carotid bruits, or masses Cardiac: RRR; no murmurs, rubs, or gallops,no edema  Respiratory:  clear to auscultation bilaterally, normal work of breathing GI: soft, nontender, nondistended, + BS MS: no deformity or atrophy  Skin: warm and dry, no rash Neuro:  Alert and Oriented x 3, Strength and sensation are intact Psych: euthymic mood, full affect  Wt Readings from Last 3 Encounters:  12/06/17 196 lb 6.4 oz (89.1 kg)  09/07/17 192 lb 3.2 oz (87.2 kg)  07/27/17 185 lb 3.2 oz (84 kg)      Studies/Labs Reviewed:   EKG:  EKG  Not repeated  Recent Labs: 06/01/2017: ALT 220; B Natriuretic Peptide 22.6 06/03/2017: Magnesium 2.5 06/04/2017: Hemoglobin 9.9; Platelets 135 06/07/2017: BUN 11; Creatinine, Ser 0.81; Potassium 3.3; Sodium 139   Lipid Panel    Component Value Date/Time   TRIG 151 (H) 06/02/2017 0307    Additional studies/ records that were reviewed today include:  2D Doppler echocardiogram 08/30/17: Study Conclusions   - Left ventricle: The cavity size was normal. Wall thickness was   normal. Systolic function was moderately to severely reduced. The   estimated ejection fraction was in the range of 30% to 35%. There   is akinesis of the mid-apicalanteroseptal and apical myocardium.   Doppler parameters are consistent with abnormal left ventricular   relaxation (grade 1 diastolic dysfunction).   Impressions:   - Technically difficult; definity used; akinesis of the distal   anteroseptal and apical walls with overall moderate to severe LV   dysfunction; mild diastolic dysfunction; trace TR.   Carotid Doppler study March 2018 Right carotid 60-79% and less than 39% on right.  ASSESSMENT:    1. Chronic systolic heart failure ([place])   2. Right-sided extracranial carotid artery stenosis   3. Other hyperlipidemia   4. Essential hypertension   5. Ventricular fibrillation  ([employer])   6. Tobacco use   7. Claudication [facility])      PLAN:  In order of problems listed above:  1. Resolved to ejection fraction of 60-65% in December 2018. 2. Bilateral carotid Doppler 3. LDL cholesterol should be less than 70.  Lipid panel and C met will be obtained today. 4. Target blood pressure 130/80 mmHg.  Continue ACE and beta-blocker.  Could further optimize beta-blocker therapy if needed. 5. No recurrence.  Has AICD with normal function 6. Has discontinued smoking. 7. Denies exertional leg discomfort.  Aggressive risk modification.  Clinical follow-up in 1 year.  Labs  are obtained today.    Medication Adjustments/Labs and Tests Ordered: Current medicines are reviewed at length with the patient today.  Concerns regarding medicines are outlined above.  Medication changes, Labs and Tests ordered today are listed in the Patient Instructions below. Patient Instructions  Medication Instructions:  Your physician recommends that you continue on your current medications as directed. Please refer to the Current Medication list given to you today.  Labwork: CMET and Lipid today  Testing/Procedures: Your physician has requested that you have a carotid duplex. This test is an ultrasound of the carotid arteries in your neck. It looks at blood flow through these arteries that supply the brain with blood. Allow one hour for this exam. There are no restrictions or special instructions.   Follow-Up: Your physician wants you to follow-up in: 1 year with Dr. Tamala Julian.  You will receive a reminder letter in the mail two months in advance. If you don't receive a letter, please call our office to schedule the follow-up appointment.   Any Other Special Instructions Will Be Listed Below (If Applicable).     If you need a refill on your cardiac medications before your next appointment, please call your pharmacy.      Signed, Sinclair Grooms, MD  12/06/2017 1:56 PM    [facility] [place], [place], [place]  [address] Phone: [phone]; Fax: [phone]

## 2017-12-06 ENCOUNTER — Encounter (INDEPENDENT_AMBULATORY_CARE_PROVIDER_SITE_OTHER): Payer: Self-pay

## 2017-12-06 ENCOUNTER — Ambulatory Visit (INDEPENDENT_AMBULATORY_CARE_PROVIDER_SITE_OTHER): Payer: 59 | Admitting: Interventional Cardiology

## 2017-12-06 ENCOUNTER — Encounter: Payer: Self-pay | Admitting: Interventional Cardiology

## 2017-12-06 VITALS — BP 124/90 | HR 66 | Ht 71.5 in | Wt 196.4 lb

## 2017-12-06 DIAGNOSIS — Z720 Tobacco use: Secondary | ICD-10-CM | POA: Diagnosis not present

## 2017-12-06 DIAGNOSIS — I10 Essential (primary) hypertension: Secondary | ICD-10-CM | POA: Diagnosis not present

## 2017-12-06 DIAGNOSIS — I4901 Ventricular fibrillation: Secondary | ICD-10-CM

## 2017-12-06 DIAGNOSIS — E7849 Other hyperlipidemia: Secondary | ICD-10-CM | POA: Diagnosis not present

## 2017-12-06 DIAGNOSIS — I5022 Chronic systolic (congestive) heart failure: Secondary | ICD-10-CM | POA: Diagnosis not present

## 2017-12-06 DIAGNOSIS — I6521 Occlusion and stenosis of right carotid artery: Secondary | ICD-10-CM

## 2017-12-06 DIAGNOSIS — I739 Peripheral vascular disease, unspecified: Secondary | ICD-10-CM

## 2017-12-06 NOTE — Patient Instructions (Signed)
Medication Instructions:  Your physician recommends that you continue on your current medications as directed. Please refer to the Current Medication list given to you today.  Labwork: CMET and Lipid today  Testing/Procedures: Your physician has requested that you have a carotid duplex. This test is an ultrasound of the carotid arteries in your neck. It looks at blood flow through these arteries that supply the brain with blood. Allow one hour for this exam. There are no restrictions or special instructions.   Follow-Up: Your physician wants you to follow-up in: 1 year with Dr. Katrinka Blazing.  You will receive a reminder letter in the mail two months in advance. If you don't receive a letter, please call our office to schedule the follow-up appointment.   Any Other Special Instructions Will Be Listed Below (If Applicable).     If you need a refill on your cardiac medications before your next appointment, please call your pharmacy.

## 2017-12-07 ENCOUNTER — Ambulatory Visit (INDEPENDENT_AMBULATORY_CARE_PROVIDER_SITE_OTHER): Payer: 59 | Admitting: *Deleted

## 2017-12-07 DIAGNOSIS — I469 Cardiac arrest, cause unspecified: Secondary | ICD-10-CM

## 2017-12-07 DIAGNOSIS — I4901 Ventricular fibrillation: Secondary | ICD-10-CM | POA: Diagnosis not present

## 2017-12-07 DIAGNOSIS — I5022 Chronic systolic (congestive) heart failure: Secondary | ICD-10-CM

## 2017-12-07 LAB — COMPREHENSIVE METABOLIC PANEL
A/G RATIO: 1.7 (ref 1.2–2.2)
ALT: 33 IU/L (ref 0–44)
AST: 35 IU/L (ref 0–40)
Albumin: 4.3 g/dL (ref 3.6–4.8)
Alkaline Phosphatase: 117 IU/L (ref 39–117)
BUN / CREAT RATIO: 13 (ref 10–24)
BUN: 14 mg/dL (ref 8–27)
Bilirubin Total: 0.5 mg/dL (ref 0.0–1.2)
CALCIUM: 9.4 mg/dL (ref 8.6–10.2)
CO2: 23 mmol/L (ref 20–29)
Chloride: 103 mmol/L (ref 96–106)
Creatinine, Ser: 1.07 mg/dL (ref 0.76–1.27)
GFR, EST AFRICAN AMERICAN: 84 mL/min/{1.73_m2} (ref >59)
GFR, EST NON AFRICAN AMERICAN: 73 mL/min/{1.73_m2} (ref >59)
GLOBULIN, TOTAL: 2.5 g/dL (ref 1.5–4.5)
Glucose: 100 mg/dL — ABNORMAL HIGH (ref 65–99)
POTASSIUM: 4.8 mmol/L (ref 3.5–5.2)
SODIUM: 140 mmol/L (ref 134–144)
TOTAL PROTEIN: 6.8 g/dL (ref 6.0–8.5)

## 2017-12-07 LAB — LIPID PANEL
CHOL/HDL RATIO: 4.1 ratio (ref 0.0–5.0)
Cholesterol, Total: 164 mg/dL (ref 100–199)
HDL: 40 mg/dL (ref >39)
Triglycerides: 420 mg/dL — ABNORMAL HIGH (ref 0–149)

## 2017-12-07 NOTE — Progress Notes (Signed)
Remote ICD transmission.   

## 2017-12-08 ENCOUNTER — Encounter: Payer: Self-pay | Admitting: Cardiology

## 2017-12-20 LAB — CUP PACEART REMOTE DEVICE CHECK
Battery Remaining Longevity: 135 mo
Battery Voltage: 3.1 V
Brady Statistic RV Percent Paced: 0.01 %
Date Time Interrogation Session: 20190313041703
HighPow Impedance: 82 Ohm
Implantable Lead Location: 753860
Lead Channel Impedance Value: 513 Ohm
Lead Channel Impedance Value: 589 Ohm
Lead Channel Setting Pacing Amplitude: 2.5 V
MDC IDC LEAD IMPLANT DT: 20180910
MDC IDC MSMT LEADCHNL RV PACING THRESHOLD AMPLITUDE: 0.625 V
MDC IDC MSMT LEADCHNL RV PACING THRESHOLD PULSEWIDTH: 0.4 ms
MDC IDC MSMT LEADCHNL RV SENSING INTR AMPL: 7 mV
MDC IDC MSMT LEADCHNL RV SENSING INTR AMPL: 7 mV
MDC IDC PG IMPLANT DT: 20180910
MDC IDC SET LEADCHNL RV PACING PULSEWIDTH: 0.4 ms
MDC IDC SET LEADCHNL RV SENSING SENSITIVITY: 0.3 mV

## 2017-12-28 ENCOUNTER — Ambulatory Visit (HOSPITAL_COMMUNITY)
Admission: RE | Admit: 2017-12-28 | Discharge: 2017-12-28 | Disposition: A | Discharge status: Home / Self Care | Payer: BLUE CROSS/BLUE SHIELD | Source: Ambulatory Visit | Attending: Cardiovascular Disease | Admitting: Cardiovascular Disease

## 2017-12-28 DIAGNOSIS — Z87891 Personal history of nicotine dependence: Secondary | ICD-10-CM | POA: Diagnosis not present

## 2017-12-28 DIAGNOSIS — I6523 Occlusion and stenosis of bilateral carotid arteries: Secondary | ICD-10-CM | POA: Diagnosis not present

## 2017-12-28 DIAGNOSIS — I6521 Occlusion and stenosis of right carotid artery: Secondary | ICD-10-CM

## 2017-12-28 DIAGNOSIS — Z8674 Personal history of sudden cardiac arrest: Secondary | ICD-10-CM | POA: Diagnosis not present

## 2017-12-28 DIAGNOSIS — I10 Essential (primary) hypertension: Secondary | ICD-10-CM | POA: Insufficient documentation

## 2017-12-28 DIAGNOSIS — I739 Peripheral vascular disease, unspecified: Secondary | ICD-10-CM | POA: Insufficient documentation

## 2017-12-28 DIAGNOSIS — E785 Hyperlipidemia, unspecified: Secondary | ICD-10-CM | POA: Diagnosis not present

## 2017-12-28 DIAGNOSIS — I2510 Atherosclerotic heart disease of native coronary artery without angina pectoris: Secondary | ICD-10-CM | POA: Diagnosis not present

## 2018-01-23 ENCOUNTER — Encounter (HOSPITAL_COMMUNITY): Payer: Self-pay

## 2018-01-23 ENCOUNTER — Emergency Department (HOSPITAL_COMMUNITY): Payer: BLUE CROSS/BLUE SHIELD

## 2018-01-23 ENCOUNTER — Emergency Department (HOSPITAL_COMMUNITY)
Admission: EM | Admit: 2018-01-23 | Discharge: 2018-01-23 | Disposition: A | Discharge status: Home / Self Care | Payer: BLUE CROSS/BLUE SHIELD | Attending: Emergency Medicine | Admitting: Emergency Medicine

## 2018-01-23 DIAGNOSIS — Z79899 Other long term (current) drug therapy: Secondary | ICD-10-CM | POA: Diagnosis not present

## 2018-01-23 DIAGNOSIS — I110 Hypertensive heart disease with heart failure: Secondary | ICD-10-CM | POA: Diagnosis not present

## 2018-01-23 DIAGNOSIS — Z87891 Personal history of nicotine dependence: Secondary | ICD-10-CM | POA: Insufficient documentation

## 2018-01-23 DIAGNOSIS — I5020 Unspecified systolic (congestive) heart failure: Secondary | ICD-10-CM | POA: Insufficient documentation

## 2018-01-23 DIAGNOSIS — Z7982 Long term (current) use of aspirin: Secondary | ICD-10-CM | POA: Insufficient documentation

## 2018-01-23 DIAGNOSIS — M791 Myalgia, unspecified site: Secondary | ICD-10-CM | POA: Diagnosis not present

## 2018-01-23 DIAGNOSIS — M25511 Pain in right shoulder: Secondary | ICD-10-CM | POA: Diagnosis present

## 2018-01-23 MED ORDER — METHOCARBAMOL 500 MG PO TABS: 500.0000 mg | ORAL_TABLET | Freq: Two times a day (BID) | ORAL | 0 refills | Status: DC

## 2018-01-23 NOTE — ED Triage Notes (Signed)
Pt presents for evaluation of R scapular pain. States has felt this way intermittent x 1 year. Denies cp/sob.

## 2018-01-23 NOTE — ED Notes (Signed)
Pt ambulated with steady gait to the bathroom.

## 2018-01-23 NOTE — ED Provider Notes (Signed)
[facility] EMERGENCY DEPARTMENT Provider Note   CSN: [id number] Arrival date & time: 01/23/18  1109     History   Chief Complaint Chief Complaint  Patient presents with  . Shoulder Pain    HPI Rick Logan is a 62 y.o. male.  62 year old male complains of 1 year history of right scapular pain characterizes sharp and worse with certain positions.  Does have a history of cardiac problems but this is nothing like that.  Denies any recent history of trauma.  Patient is right-handed.  No shortness of breath.  Pain does not radiate down his right arm.  Pain is responsive to using NSAIDs.  He is here at the encouragement of his mother.     Past Medical History:  Diagnosis Date  . Arthritis    "joints get sore at times" (08/28/2015)  . Cardiac arrest (HCC)    Vfib   . Claudication of calf muscles (HCC)    left lower extremity  . Essential hypertension   . Hyperlipidemia   . Hypertension   . PVD (peripheral vascular disease) (HCC)   . Smoking   . Systolic heart failure (HCC)    EF 30-35%  . Tobacco abuse     Patient Active Problem List   Diagnosis Date Noted  . Right-sided extracranial carotid artery stenosis 12/06/2017  . Systolic heart failure (HCC) 07/27/2017  . Non-ST elevation (NSTEMI) myocardial infarction (HCC) 06/03/2017  . Aspiration pneumonia (HCC)   . Syncope 06/01/2017  . Ventricular fibrillation (HCC) 06/01/2017  . Hyperlipidemia 06/01/2017  . Tobacco use 06/01/2017  . Cardiac arrest (HCC) 06/01/2017  . PVD (peripheral vascular disease) (HCC) 08/29/2015  . Essential hypertension 07/30/2015  . Claudication (HCC) 07/30/2015    Past Surgical History:  Procedure Laterality Date  . ABDOMINAL SURGERY     No details available  . APPENDECTOMY    . COLON SURGERY    . EXPLORATORY LAPAROTOMY  1970's X 2   "part of small intestines; part of my bowels; appendix; 2nd OR was for adhesions"  . ICD IMPLANT N/A 06/06/2017   Procedure: ICD  Implant;  Surgeon: Duke Salvia, MD;  Location: [facility] INVASIVE CV LAB;  Service: Cardiovascular;  Laterality: N/A;  . LEFT HEART CATH AND CORONARY ANGIOGRAPHY N/A 06/03/2017   Procedure: LEFT HEART CATH AND CORONARY ANGIOGRAPHY;  Surgeon: [place], Peter M, MD;  Location: [facility] INVASIVE CV LAB;  Service: Cardiovascular;  Laterality: N/A;  . PERIPHERAL VASCULAR CATHETERIZATION N/A 08/28/2015   Procedure: Lower Extremity Angiography;  Surgeon: Runell Gess, MD;  Location: [facility] INVASIVE CV LAB;  Service: Cardiovascular;  Laterality: N/A;  . TONSILLECTOMY          Home Medications    Prior to Admission medications   Medication Sig Start Date End Date Taking? Authorizing Provider  aspirin EC 81 MG tablet Take 81 mg by mouth daily.    [provider]  atorvastatin (LIPITOR) 40 MG tablet Take 1 tablet (40 mg total) by mouth daily. 09/07/17   Duke Salvia, MD  benazepril (LOTENSIN) 40 MG tablet Take 0.5 tablets (20 mg total) by mouth at bedtime. 09/07/17   Duke Salvia, MD  carvedilol (COREG) 3.125 MG tablet TAKE 1 TABLET BY MOUTH TWICE A DAY WITH A MEAL 09/01/17   Arty Baumgartner, NP    Family History Family History  Problem Relation Age of Onset  . Cancer Father     Social History Social History   Tobacco Use  .  Smoking status: Former Smoker    Packs/day: 1.00    Years: 41.00    Pack years: 41.00    Types: Cigarettes    Last attempt to quit: 06/01/2017    Years since quitting: 0.6  . Smokeless tobacco: Never Used  Substance Use Topics  . Alcohol use: No  . Drug use: No     Allergies   Patient has no known allergies.   Review of Systems Review of Systems  All other systems reviewed and are negative.    Physical Exam Updated Vital Signs BP (!) 131/91 (BP Location: Right Arm)   Pulse 86   Temp 98.5 F (36.9 C) (Oral)   Resp 18   SpO2 98%   Physical Exam  Constitutional: He is oriented to person, place, and time. He appears well-developed and  well-nourished.  Non-toxic appearance. No distress.  HENT:  Head: Normocephalic and atraumatic.  Eyes: Pupils are equal, round, and reactive to light. Conjunctivae, EOM and lids are normal.  Neck: Normal range of motion. Neck supple. No tracheal deviation present. No thyroid mass present.  Cardiovascular: Normal rate, regular rhythm and normal heart sounds. Exam reveals no gallop.  No murmur heard. Pulmonary/Chest: Effort normal and breath sounds normal. No stridor. No respiratory distress. He has no decreased breath sounds. He has no wheezes. He has no rhonchi. He has no rales.  Abdominal: Soft. Normal appearance and bowel sounds are normal. He exhibits no distension. There is no tenderness. There is no rebound and no CVA tenderness.  Musculoskeletal: Normal range of motion. He exhibits no edema or tenderness.       Back:  Neurological: He is alert and oriented to person, place, and time. He has normal strength. No cranial nerve deficit or sensory deficit. GCS eye subscore is 4. GCS verbal subscore is 5. GCS motor subscore is 6.  Skin: Skin is warm and dry. No abrasion and no rash noted.  Psychiatric: He has a normal mood and affect. His speech is normal and behavior is normal.  Nursing note and vitals reviewed.    ED Treatments / Results  Labs (all labs ordered are listed, but only abnormal results are displayed) Labs Reviewed - No data to display  EKG None  Radiology Dg Shoulder Right  Result Date: 01/23/2018 CLINICAL DATA:  Pain, chronic EXAM: RIGHT SHOULDER - 2+ VIEW COMPARISON:  None. FINDINGS: Frontal, oblique, Y scapular, and axillary images were obtained. There is no appreciable fracture or dislocation. Joint spaces appear normal. No erosive change or intra-articular calcification. Visualized lungs are clear. IMPRESSION: No fracture or dislocation.  No appreciable arthropathy. Electronically Signed   By: Bretta Bang III M.D.   On: 01/23/2018 12:22     Procedures Procedures (including critical care time)  Medications Ordered in ED Medications - No data to display   Initial Impression / Assessment and Plan / ED Course  I have reviewed the triage vital signs and the nursing notes.  Pertinent labs & imaging results that were available during my care of the patient were reviewed by me and considered in my medical decision making (see chart for details).     Patient has pinpoint muscular tenderness at the region of the levator muscle on the right side.  Suspect trigger point.  Patient instructed to use Motrin as directed and follow-up with primary care doctor.  Final Clinical Impressions(s) / ED Diagnoses   Final diagnoses:  None    ED Discharge Orders    None  Lorre Nick, MD 01/23/18 1251

## 2018-01-23 NOTE — ED Notes (Signed)
ED Provider at bedside. 

## 2018-03-08 ENCOUNTER — Ambulatory Visit (INDEPENDENT_AMBULATORY_CARE_PROVIDER_SITE_OTHER): Payer: BLUE CROSS/BLUE SHIELD | Admitting: *Deleted

## 2018-03-08 DIAGNOSIS — I469 Cardiac arrest, cause unspecified: Secondary | ICD-10-CM

## 2018-03-08 DIAGNOSIS — I5020 Unspecified systolic (congestive) heart failure: Secondary | ICD-10-CM

## 2018-03-08 NOTE — Progress Notes (Signed)
Remote ICD transmission.   

## 2018-03-09 ENCOUNTER — Encounter: Payer: Self-pay | Admitting: Cardiology

## 2018-03-14 LAB — CUP PACEART REMOTE DEVICE CHECK
Battery Remaining Longevity: 134 mo
HIGH POWER IMPEDANCE MEASURED VALUE: 78 Ohm
Implantable Lead Implant Date: 20180910
Implantable Lead Location: 753860
Implantable Pulse Generator Implant Date: 20180910
Lead Channel Pacing Threshold Amplitude: 0.625 V
Lead Channel Pacing Threshold Pulse Width: 0.4 ms
Lead Channel Setting Pacing Pulse Width: 0.4 ms
Lead Channel Setting Sensing Sensitivity: 0.3 mV
MDC IDC MSMT BATTERY VOLTAGE: 3.06 V
MDC IDC MSMT LEADCHNL RV IMPEDANCE VALUE: 475 Ohm
MDC IDC MSMT LEADCHNL RV IMPEDANCE VALUE: 532 Ohm
MDC IDC MSMT LEADCHNL RV SENSING INTR AMPL: 6.5 mV
MDC IDC MSMT LEADCHNL RV SENSING INTR AMPL: 6.5 mV
MDC IDC SESS DTM: 20190612084225
MDC IDC SET LEADCHNL RV PACING AMPLITUDE: 2.5 V
MDC IDC STAT BRADY RV PERCENT PACED: 0.01 %

## 2018-06-07 ENCOUNTER — Ambulatory Visit (INDEPENDENT_AMBULATORY_CARE_PROVIDER_SITE_OTHER): Payer: BLUE CROSS/BLUE SHIELD | Admitting: *Deleted

## 2018-06-07 DIAGNOSIS — I469 Cardiac arrest, cause unspecified: Secondary | ICD-10-CM | POA: Diagnosis not present

## 2018-06-07 DIAGNOSIS — I5020 Unspecified systolic (congestive) heart failure: Secondary | ICD-10-CM

## 2018-06-07 NOTE — Progress Notes (Signed)
Remote ICD transmission.   

## 2018-06-20 ENCOUNTER — Other Ambulatory Visit: Payer: Self-pay | Admitting: *Deleted

## 2018-06-20 DIAGNOSIS — I6521 Occlusion and stenosis of right carotid artery: Secondary | ICD-10-CM

## 2018-06-28 LAB — CUP PACEART REMOTE DEVICE CHECK
Implantable Pulse Generator Implant Date: 20180910
MDC IDC LEAD IMPLANT DT: 20180910
MDC IDC LEAD LOCATION: 753860
MDC IDC SESS DTM: 20191002080337

## 2018-06-30 ENCOUNTER — Other Ambulatory Visit: Payer: Self-pay | Admitting: Internal Medicine

## 2018-08-09 ENCOUNTER — Telehealth: Payer: Self-pay | Admitting: Cardiology

## 2018-08-09 NOTE — Telephone Encounter (Signed)
Patient calling b/c he needs an MRI to be completed. I instructed pt to have [facility] send [place] a form requesting the information. Pt verbalized understanding.

## 2018-08-14 ENCOUNTER — Telehealth: Payer: Self-pay

## 2018-08-14 NOTE — Telephone Encounter (Signed)
   [facility] Pre-operative Risk Assessment    Request for surgical clearance:  1. What type of surgery is being performed? Right Knee Scope   2. When is this surgery scheduled? TBD   3. What type of clearance is required (medical clearance vs. Pharmacy clearance to hold med vs. Both)? Both  4. Are there any medications that need to be held prior to surgery and how long? Aspirin 81 mg   5. Practice name and name of physician performing surgery? Dr. French Ana, [place]    6. What is your office phone number: [phone], Sherri: [phone]    2.   What is your office fax number: [phone], Att: Sherri  8.   Anesthesia type (None, local, MAC, general) ? Unspecified    Sarina Ill 08/14/2018, 12:59 PM  _________________________________________________________________   (provider comments below)

## 2018-08-15 NOTE — Telephone Encounter (Signed)
Appointment scheduled and patient voiced understanding. Patient requested to be put on a call back list. Informed patient that I would let the schedulers know. He voiced understanding.

## 2018-08-15 NOTE — Telephone Encounter (Signed)
   Primary Cardiologist:Henry Malissa Hippo III, MD  Chart reviewed as part of pre-operative protocol coverage. Because of Rick Logan past medical history and time since last visit, he/she will require a follow-up visit in order to better assess preoperative cardiovascular risk.  Pre-op covering staff: - Please schedule appointment and call patient to inform them. - Please contact requesting surgeon's office via preferred method (i.e, phone, fax) to inform them of need for appointment prior to surgery.  If applicable, this message will also be routed to pharmacy pool and/or primary cardiologist for input on holding anticoagulant/antiplatelet agent as requested below so that this information is available at time of patient's appointment.   Azalee Course, [place]  08/15/2018, 2:31 PM

## 2018-09-06 ENCOUNTER — Ambulatory Visit (INDEPENDENT_AMBULATORY_CARE_PROVIDER_SITE_OTHER): Payer: BLUE CROSS/BLUE SHIELD

## 2018-09-06 DIAGNOSIS — I4901 Ventricular fibrillation: Secondary | ICD-10-CM

## 2018-09-06 DIAGNOSIS — I5022 Chronic systolic (congestive) heart failure: Secondary | ICD-10-CM

## 2018-09-07 NOTE — Progress Notes (Signed)
Remote ICD transmission.   

## 2018-09-08 ENCOUNTER — Encounter: Payer: Self-pay | Admitting: Cardiology

## 2018-09-11 NOTE — Progress Notes (Addendum)
Cardiology Office Note   Date:  09/12/2018   ID:  Rick Logan, DOB 09/10/1956, MRN [id number]  PCP:  [name]  Cardiologist:  Dr. Katrinka Blazing     Chief Complaint  Patient presents with  . Pre-op Exam      History of Present Illness: Rick Logan is a 62 y.o. male who presents for pre-op eval. For Rt knee scope.  Per Dr. Madelon Lips with [facility].  He has a hx of Hypertension, hyperlipidemia, and tobacco use (stopped after arrest) and hx of cardiac arrest due to ventricular fibrillation.  on cath minimal CAD.  Also has known bilateral carotid disease.  Subsequently had AICD placement.  Also with Rt ICA with 60-79% stenosis, and Lt 1-39% stenosis.  Stable in Sept.   Today he has no chest pain and his usual chronic dyspnea that has not changed.  No lightheadedness.  No palpitations. He has had no device shocks.  He does have some claudication with increased exertion.  Not severe.  Pt has Rt torn meniscus and needs scope.  Today he tells me he may post pone procedure because he is feeling better.  He can walk up 2 flights of steps without problems.        Past Medical History:  Diagnosis Date  . Arthritis    "joints get sore at times" (08/28/2015)  . Cardiac arrest (HCC)    Vfib   . Claudication of calf muscles (HCC)    left lower extremity  . Essential hypertension   . Hyperlipidemia   . Hypertension   . PVD (peripheral vascular disease) (HCC)   . Smoking   . Systolic heart failure (HCC)    EF 30-35%  . Tobacco abuse     Past Surgical History:  Procedure Laterality Date  . ABDOMINAL SURGERY     No details available  . APPENDECTOMY    . COLON SURGERY    . EXPLORATORY LAPAROTOMY  1970's X 2   "part of small intestines; part of my bowels; appendix; 2nd OR was for adhesions"  . ICD IMPLANT N/A 06/06/2017   Procedure: ICD Implant;  Surgeon: Duke Salvia, MD;  Location: [facility] INVASIVE CV LAB;  Service: Cardiovascular;   Laterality: N/A;  . LEFT HEART CATH AND CORONARY ANGIOGRAPHY N/A 06/03/2017   Procedure: LEFT HEART CATH AND CORONARY ANGIOGRAPHY;  Surgeon: Swaziland, Peter M, MD;  Location: [facility] INVASIVE CV LAB;  Service: Cardiovascular;  Laterality: N/A;  . PERIPHERAL VASCULAR CATHETERIZATION N/A 08/28/2015   Procedure: Lower Extremity Angiography;  Surgeon: Runell Gess, MD;  Location: [facility] INVASIVE CV LAB;  Service: Cardiovascular;  Laterality: N/A;  . TONSILLECTOMY       Current Outpatient Medications  Medication Sig Dispense Refill  . aspirin EC 81 MG tablet Take 81 mg by mouth daily.    [name] atorvastatin (LIPITOR) 40 MG tablet TAKE 1 TABLET BY MOUTH EVERY DAY 90 tablet 1  . benazepril (LOTENSIN) 40 MG tablet Take 0.5 tablets (20 mg total) by mouth at bedtime. 90 tablet 2  . carvedilol (COREG) 3.125 MG tablet TAKE 1 TABLET BY MOUTH TWICE A DAY WITH A MEAL 180 tablet 3  . methocarbamol (ROBAXIN) 500 MG tablet Take 1 tablet (500 mg total) by mouth 2 (two) times daily. 20 tablet 0   No current facility-administered medications for this visit.     Allergies:   Patient has no known allergies.    Social History:  The patient  reports that  he quit smoking about 15 months ago. His smoking use included cigarettes. He has a 41.00 pack-year smoking history. He has never used smokeless tobacco. He reports that he does not drink alcohol or use drugs.   Family History:  The patient's family history includes Cancer in his father.    ROS:  General:no colds or fevers, + weight gain Skin:no rashes or ulcers HEENT:no blurred vision, no congestion CV:see HPI PUL:see HPI GI:no diarrhea constipation or melena, no indigestion GU:no hematuria, no dysuria MS:no joint pain, no claudication Neuro:no syncope, no lightheadedness Endo:no diabetes, no thyroid disease  Wt Readings from Last 3 Encounters:  09/12/18 206 lb 1.9 oz (93.5 kg)  12/06/17 196 lb 6.4 oz (89.1 kg)  09/07/17 192 lb 3.2 oz (87.2 kg)     PHYSICAL  EXAM: VS:  BP 118/76   Pulse 76   Ht 5' 11.5" (1.816 m)   Wt 206 lb 1.9 oz (93.5 kg)   SpO2 98%   BMI 28.35 kg/m  , BMI Body mass index is 28.35 kg/m. General:Pleasant affect, NAD Skin:Warm and dry, brisk capillary refill HEENT:normocephalic, sclera clear, mucus membranes moist Neck:supple, no JVD, no bruits  Heart:S1S2 RRR without murmur, gallup, rub or click Lungs:clear without rales, rhonchi, or wheezes ZOX:WRUE, non tender, + BS, do not palpate liver spleen or masses Ext:no lower ext edema, ? pedal pulses, 2+ radial pulses Neuro:alert and oriented X 3, MAE, follows commands, + facial symmetry    EKG:  EKG is ordered today. The ekg ordered today demonstrates SR LAD and non specific ST and T wave abnormalities. No acute changes from previous.    Recent Labs: 12/06/2017: ALT 33; BUN 14; Creatinine, Ser 1.07; Potassium 4.8; Sodium 140    Lipid Panel    Component Value Date/Time   CHOL 164 12/06/2017 1401   TRIG 420 (H) 12/06/2017 1401   HDL 40 12/06/2017 1401   CHOLHDL 4.1 12/06/2017 1401   LDLCALC Comment 12/06/2017 1401       Other studies Reviewed: Additional studies/ records that were reviewed today include: . Echo  Study Conclusions  - Left ventricle: The cavity size was normal. Wall thickness was   normal. Systolic function was normal. The estimated ejection   fraction was in the range of 60% to 65%. Wall motion was normal;   there were no regional wall motion abnormalities. Features are   consistent with a pseudonormal left ventricular filling pattern,   with concomitant abnormal relaxation and increased filling   pressure (grade 2 diastolic dysfunction). - Aortic valve: There was no stenosis. - Mitral valve: There was trivial regurgitation. - Right ventricle: The cavity size was normal. Pacer wire or   catheter noted in right ventricle. Systolic function was normal. - Tricuspid valve: Peak RV-RA gradient (S): 28 mm Hg. - Pulmonary arteries: PA peak  pressure: 31 mm Hg (S). - Inferior vena cava: The vessel was normal in size. The   respirophasic diameter changes were in the normal range (>= 50%),   consistent with normal central venous pressure.  Impressions:  - Normal LV size with EF 60-65%. Moderate diastolic dysfunction.   Normal RV size and systolic function. No significant valvular   abnormalities.  Cardiac cath  Mid RCA lesion, 25 %stenosed.  Prox LAD to Mid LAD lesion, 20 %stenosed.  There is moderate left ventricular systolic dysfunction.  LV end diastolic pressure is normal.  The left ventricular ejection fraction is 35-45% by visual estimate.   1. Minor nonobstructive CAD 2. Moderate LV  dysfunction. There a segmental wall motion abnormalities involving the mid ventricular segments and inferoapex. Consider Takotsubo syndrome. 3. Normal LVEDP.  Plan: EP evaluation for consideration of ICD   ASSESSMENT AND PLAN:  1.  Pre-op eval  Chart reviewed as part of pre-operative protocol coverage. Given past medical history and time since last visit, based on ACC/AHA guidelines, Rick Logan would be at acceptable risk for the planned procedure without further cardiovascular testing.   Ok to hold asprin for 5 days if needed.  I will route this recommendation to the requesting party via Epic fax function and remove from pre-op pool.   2.  Hx of V fib and now with AICD stable and no discharges. Followed by Dr. Graciela Husbands.  3.  Very minimal CAD on cath in 2018 and no angina.  Follow up  With Dr. Katrinka Blazing in 3-4 months.   4.   Hx tobaco use, stopped in 2018.  5.   PAD with directional atherectomy PTA of and balloon PTA of LSFA.  He has some claudication in both legs and has been several years since arterial dopplers, not severe so he will hold off for now. Will call if increased pain.   Also with carotid disease will follow.   6. HLD with elevated TG on last check so no LDL continue lipitor.  Goal is LDL < 70  Nada Boozer, NP 09/12/2018, 5:46 PM       Current medicines are reviewed with the patient today.  The patient Has no concerns regarding medicines.  The following changes have been made:  See above Labs/ tests ordered today include:see above  Disposition:   FU:  see above  Signed, Nada Boozer, NP  09/12/2018 8:12 AM    [facility] [address] [place], [place], [place]  [address]/ [address] [place], [place] Phone: [phone]; Fax: [phone]  [phone]

## 2018-09-12 ENCOUNTER — Encounter: Payer: Self-pay | Admitting: Cardiology

## 2018-09-12 ENCOUNTER — Ambulatory Visit: Payer: BLUE CROSS/BLUE SHIELD | Admitting: Cardiology

## 2018-09-12 VITALS — BP 118/76 | HR 76 | Ht 71.5 in | Wt 206.1 lb

## 2018-09-12 DIAGNOSIS — I739 Peripheral vascular disease, unspecified: Secondary | ICD-10-CM

## 2018-09-12 DIAGNOSIS — E7849 Other hyperlipidemia: Secondary | ICD-10-CM | POA: Diagnosis not present

## 2018-09-12 DIAGNOSIS — Z01818 Encounter for other preprocedural examination: Secondary | ICD-10-CM | POA: Diagnosis not present

## 2018-09-12 DIAGNOSIS — I4901 Ventricular fibrillation: Secondary | ICD-10-CM | POA: Diagnosis not present

## 2018-09-12 NOTE — Patient Instructions (Signed)
Medication Instructions:  Your physician recommends that you continue on your current medications as directed. Please refer to the Current Medication list given to you today.  If you need a refill on your cardiac medications before your next appointment, please call your pharmacy.   Lab work: None If you have labs (blood work) drawn today and your tests are completely normal, you will receive your results only by: . MyChart Message (if you have MyChart) OR . A paper copy in the mail If you have any lab test that is abnormal or we need to change your treatment, we will call you to review the results.  Testing/Procedures: None  Follow-Up: At [facility], you and your health needs are our priority.  As part of our continuing mission to provide you with exceptional heart care, we have created designated Provider Care Teams.  These Care Teams include your primary Cardiologist (physician) and Advanced Practice Providers (APPs -  Physician Assistants and Nurse Practitioners) who all work together to provide you with the care you need, when you need it. You will need a follow up appointment in 4 months.  Please call our office 2 months in advance to schedule this appointment.  You may see Henry W Smith III, MD or one of the following Advanced Practice Providers on your designated Care Team:   Lori Gerhardt, NP Laura Ingold, NP . Jill McDaniel, NP  Any Other Special Instructions Will Be Listed Below (If Applicable).    

## 2018-10-21 LAB — CUP PACEART REMOTE DEVICE CHECK
Implantable Lead Implant Date: 20180910
Implantable Lead Location: 753860
Implantable Pulse Generator Implant Date: 20180910
MDC IDC SESS DTM: 20200125153301

## 2018-11-22 DIAGNOSIS — S83271A Complex tear of lateral meniscus, current injury, right knee, initial encounter: Secondary | ICD-10-CM | POA: Diagnosis not present

## 2018-11-22 DIAGNOSIS — M6751 Plica syndrome, right knee: Secondary | ICD-10-CM | POA: Diagnosis not present

## 2018-11-22 DIAGNOSIS — M94261 Chondromalacia, right knee: Secondary | ICD-10-CM | POA: Diagnosis not present

## 2018-11-22 DIAGNOSIS — S83231A Complex tear of medial meniscus, current injury, right knee, initial encounter: Secondary | ICD-10-CM | POA: Diagnosis not present

## 2018-11-22 DIAGNOSIS — G8918 Other acute postprocedural pain: Secondary | ICD-10-CM | POA: Diagnosis not present

## 2018-11-22 DIAGNOSIS — M1711 Unilateral primary osteoarthritis, right knee: Secondary | ICD-10-CM | POA: Diagnosis not present

## 2018-11-22 DIAGNOSIS — Y999 Unspecified external cause status: Secondary | ICD-10-CM | POA: Diagnosis not present

## 2018-11-22 DIAGNOSIS — S83241A Other tear of medial meniscus, current injury, right knee, initial encounter: Secondary | ICD-10-CM | POA: Diagnosis not present

## 2018-11-22 DIAGNOSIS — X58XXXA Exposure to other specified factors, initial encounter: Secondary | ICD-10-CM | POA: Diagnosis not present

## 2018-11-27 DIAGNOSIS — S83231D Complex tear of medial meniscus, current injury, right knee, subsequent encounter: Secondary | ICD-10-CM | POA: Diagnosis not present

## 2018-11-27 DIAGNOSIS — M6281 Muscle weakness (generalized): Secondary | ICD-10-CM | POA: Diagnosis not present

## 2018-11-27 DIAGNOSIS — M25561 Pain in right knee: Secondary | ICD-10-CM | POA: Diagnosis not present

## 2018-11-27 DIAGNOSIS — R262 Difficulty in walking, not elsewhere classified: Secondary | ICD-10-CM | POA: Diagnosis not present

## 2018-11-30 DIAGNOSIS — M25561 Pain in right knee: Secondary | ICD-10-CM | POA: Diagnosis not present

## 2018-12-06 ENCOUNTER — Ambulatory Visit (INDEPENDENT_AMBULATORY_CARE_PROVIDER_SITE_OTHER): Payer: Medicare Other | Admitting: *Deleted

## 2018-12-06 ENCOUNTER — Other Ambulatory Visit: Payer: Self-pay

## 2018-12-06 DIAGNOSIS — M25561 Pain in right knee: Secondary | ICD-10-CM | POA: Diagnosis not present

## 2018-12-06 DIAGNOSIS — M6281 Muscle weakness (generalized): Secondary | ICD-10-CM | POA: Diagnosis not present

## 2018-12-06 DIAGNOSIS — R262 Difficulty in walking, not elsewhere classified: Secondary | ICD-10-CM | POA: Diagnosis not present

## 2018-12-06 DIAGNOSIS — I5022 Chronic systolic (congestive) heart failure: Secondary | ICD-10-CM

## 2018-12-06 DIAGNOSIS — I4901 Ventricular fibrillation: Secondary | ICD-10-CM | POA: Diagnosis not present

## 2018-12-06 DIAGNOSIS — S83231D Complex tear of medial meniscus, current injury, right knee, subsequent encounter: Secondary | ICD-10-CM | POA: Diagnosis not present

## 2018-12-07 LAB — CUP PACEART REMOTE DEVICE CHECK
Date Time Interrogation Session: 20200312171609
Implantable Lead Location: 753860
MDC IDC LEAD IMPLANT DT: 20180910
MDC IDC PG IMPLANT DT: 20180910

## 2018-12-13 NOTE — Progress Notes (Signed)
Remote ICD transmission.   

## 2018-12-15 ENCOUNTER — Other Ambulatory Visit: Payer: Self-pay | Admitting: *Deleted

## 2018-12-15 MED ORDER — BENAZEPRIL HCL 40 MG PO TABS: 20.0000 mg | ORAL_TABLET | Freq: Every day | ORAL | 0 refills | Status: DC

## 2018-12-28 ENCOUNTER — Telehealth: Payer: Self-pay | Admitting: Nurse Practitioner

## 2018-12-28 NOTE — Telephone Encounter (Signed)
New Message:    Pt wants to know if he still need to keep his 01-09-19 visit with Kathi Ludwig, since his ultrasound was moved to 01-31-19?

## 2018-12-29 ENCOUNTER — Other Ambulatory Visit: Payer: Self-pay | Admitting: Nurse Practitioner

## 2018-12-29 MED ORDER — ATORVASTATIN CALCIUM 40 MG PO TABS: 40.0000 mg | ORAL_TABLET | Freq: Every day | ORAL | 1 refills | Status: DC

## 2018-12-29 NOTE — Telephone Encounter (Signed)
I have called - refilled his Lipitor.   Will attempt phone visit that week of April 13th. He is aware that we will be calling back to arrange next week.

## 2019-01-01 ENCOUNTER — Inpatient Hospital Stay (HOSPITAL_COMMUNITY): Admission: RE | Admit: 2019-01-01 | Payer: BLUE CROSS/BLUE SHIELD | Source: Ambulatory Visit

## 2019-01-02 DIAGNOSIS — M25561 Pain in right knee: Secondary | ICD-10-CM | POA: Diagnosis not present

## 2019-01-09 ENCOUNTER — Telehealth: Payer: Medicare Other | Admitting: Nurse Practitioner

## 2019-01-10 ENCOUNTER — Telehealth: Payer: Medicare Other | Admitting: Nurse Practitioner

## 2019-01-31 ENCOUNTER — Other Ambulatory Visit: Payer: Self-pay

## 2019-01-31 ENCOUNTER — Ambulatory Visit (HOSPITAL_COMMUNITY)
Admission: RE | Admit: 2019-01-31 | Discharge: 2019-01-31 | Disposition: A | Discharge status: Home / Self Care | Payer: Medicare Other | Source: Ambulatory Visit | Attending: Interventional Cardiology | Admitting: Interventional Cardiology

## 2019-01-31 ENCOUNTER — Other Ambulatory Visit (HOSPITAL_COMMUNITY): Payer: Self-pay | Admitting: Interventional Cardiology

## 2019-01-31 DIAGNOSIS — I6521 Occlusion and stenosis of right carotid artery: Secondary | ICD-10-CM

## 2019-01-31 DIAGNOSIS — I6523 Occlusion and stenosis of bilateral carotid arteries: Secondary | ICD-10-CM

## 2019-02-02 ENCOUNTER — Telehealth: Payer: Self-pay | Admitting: Interventional Cardiology

## 2019-02-02 NOTE — Telephone Encounter (Signed)
Pt returned a call from the office to go over test results from 05/06.

## 2019-02-02 NOTE — Telephone Encounter (Signed)
Pt called back and LVM.  I called pt back and instructed him how to send a manual transmission w/ his home monitor. Attempted to help pt trouble shoot monitor. Error code 3230 half through transmission. Instructed pt to call tech support for further help trouble shooting monitor.

## 2019-02-02 NOTE — Telephone Encounter (Signed)
Follow up  ° ° °Pt is returning call  ° ° °Please call back  °

## 2019-02-02 NOTE — Telephone Encounter (Signed)
Transmission shows normal device function. Histograms and lead trends appropriate. Presenting rhythm: Vs @ 87bpm. No AF episodes or ventricular arrhythmias. VT monitor zone programmed at 162bpm.   Pt reports that he had a bad dizzy spell on 4/9 around 05:30, felt like the room was spinning, then started sweating. Episode lasted ~10 minutes. No other issues since then. Advised pt that transmission shows no arrhythmias. He was unable to check his HR/BP at the time of the episode. Pt reports he has a virtual appointment with Nada Boozer, NP, on 5/11. Will send this information to Dr. Graciela Husbands and Vernona Rieger, NP, for review. Advised to call our office for episodes like this in the future so that we can review a transmission that day. Pt verbalizes understanding and thanked me for my call.

## 2019-02-02 NOTE — Telephone Encounter (Signed)
Spoke w/ pt and informed him to send a remote transmission w/ his home monitor.He stated that he isn't home right now but he will be home in about 45 minutes. He is going to call back to get help sending transmission. He stated that on the morning of January 04, 2019 around 5:30 -5:45 AM he woke up with the room spinning. Pt stated that when the room stopped spinning he was sweating profusely. He said it last about 5 mins and then he felt better.

## 2019-02-02 NOTE — Telephone Encounter (Signed)
Returned pts call.  Left another message for pt to call back. 

## 2019-02-02 NOTE — Telephone Encounter (Signed)
Returned pts call.  He has been made aware of his Vas Carotid US. Pt also has some questions about an incident that happened on 01/04/19 and waned to see if his device picked anything up around that date.  Pt advised I would send this message to device clinic and have someone call him back. Pt thanked me for the help.

## 2019-02-02 NOTE — Telephone Encounter (Signed)
Pt called back and stated that he talked to tech support and they were able to successfully send remote transmission.

## 2019-02-05 NOTE — Progress Notes (Signed)
Virtual Visit via Video Note   This visit type was conducted due to national recommendations for restrictions regarding the COVID-19 Pandemic (e.g. social distancing) in an effort to limit this patient's exposure and mitigate transmission in our community.  Due to his co-morbid illnesses, this patient is at least at moderate risk for complications without adequate follow up.  This format is felt to be most appropriate for this patient at this time.  All issues noted in this document were discussed and addressed.  A limited physical exam was performed with this format.  Please refer to the patient's chart for his consent to telehealth for [facility].   Date:  02/06/2019   ID:  [name], DOB 11-05-1955, MRN [id number]  Patient Location: Home Provider Location: Office  PCP:  [name]  Cardiologist:  Lesleigh Noe, MD  Electrophysiologist:  Sherryl Manges, MD  PV Dr. Allyson Sabal  Evaluation Performed:  Follow-Up Visit  Chief Complaint:  Primary cardiac arrest   History of Present Illness:    Rick Logan is a 62 y.o. male with He has a hx of Hypertension, hyperlipidemia, and tobacco use (stopped after arrest) and hx of cardiac arrest due to ventricular fibrillation.on cath minimal CAD.  Also has known bilateral carotid disease.Subsequently had AICD placement.  Also with Rt ICA with 60-79% stenosis, and Lt 1-39% stenosis.  Stable in Sept. In 2016 he had  successful [facility] 1 directional arthrectomy, lutonix drug eluting balloon angioplasty using spider distal protection of high-grade proximal and mid left SFA stenosis for lifestyle limiting claudication.   On last visit he was stable.  He was seen for Rt torn meniscus and needed scope.  He was cleared and did well with scope, still with knee pain but he was told should improved over next few months.   Today : On 01/04/19 he did have an episode of room spinning dizziness when he first got up  and did not last very long, minutes. His device was checked remotely and no arrhythmias noted. No chest pain or SOB.  No edema.  No cold or fevers.    He is having trouble with walking up hill he has leg cramps in both legs with his Lt being slightly worse than Rt.  With rest it does improve.  Has not had his lower PAD evaluated in some time.  His carotid stenosis is stable.  He has had no device discharges.   He does relate increased dyspnea with bending and with some walking.   Pacer interrogated 02/02/19 after reporting dizzy spell 01/04/19 with diaphoresis. normal device function     The patient does not have symptoms concerning for COVID-19 infection (fever, chills, cough, or new shortness of breath).    Past Medical History:  Diagnosis Date  . Arthritis    "joints get sore at times" (08/28/2015)  . Cardiac arrest (HCC)    Vfib   . Claudication of calf muscles (HCC)    left lower extremity  . Essential hypertension   . Hyperlipidemia   . Hypertension   . PVD (peripheral vascular disease) (HCC)   . Smoking   . Systolic heart failure (HCC)    EF 30-35%  . Tobacco abuse    Past Surgical History:  Procedure Laterality Date  . ABDOMINAL SURGERY     No details available  . APPENDECTOMY    . COLON SURGERY    . EXPLORATORY LAPAROTOMY  1970's X 2   "part of small  intestines; part of my bowels; appendix; 2nd OR was for adhesions"  . ICD IMPLANT N/A 06/06/2017   Procedure: ICD Implant;  Surgeon: Duke Salvia, MD;  Location: [facility] INVASIVE CV LAB;  Service: Cardiovascular;  Laterality: N/A;  . LEFT HEART CATH AND CORONARY ANGIOGRAPHY N/A 06/03/2017   Procedure: LEFT HEART CATH AND CORONARY ANGIOGRAPHY;  Surgeon: Swaziland, Peter M, MD;  Location: [facility] INVASIVE CV LAB;  Service: Cardiovascular;  Laterality: N/A;  . PERIPHERAL VASCULAR CATHETERIZATION N/A 08/28/2015   Procedure: Lower Extremity Angiography;  Surgeon: Runell Gess, MD;  Location: [facility] INVASIVE CV LAB;  Service: Cardiovascular;   Laterality: N/A;  . TONSILLECTOMY       Current Meds  Medication Sig  . aspirin EC 81 MG tablet Take 81 mg by mouth daily.  [name] atorvastatin (LIPITOR) 40 MG tablet Take 1 tablet (40 mg total) by mouth daily.  . benazepril (LOTENSIN) 40 MG tablet Take 0.5 tablets (20 mg total) by mouth at bedtime.  . carvedilol (COREG) 3.125 MG tablet TAKE 1 TABLET BY MOUTH TWICE A DAY WITH A MEAL  . [DISCONTINUED] atorvastatin (LIPITOR) 40 MG tablet Take 1 tablet (40 mg total) by mouth daily.  . [DISCONTINUED] benazepril (LOTENSIN) 40 MG tablet Take 0.5 tablets (20 mg total) by mouth at bedtime.  . [DISCONTINUED] carvedilol (COREG) 3.125 MG tablet TAKE 1 TABLET BY MOUTH TWICE A DAY WITH A MEAL     Allergies:   Patient has no known allergies.   Social History   Tobacco Use  . Smoking status: Former Smoker    Packs/day: 1.00    Years: 41.00    Pack years: 41.00    Types: Cigarettes    Last attempt to quit: 06/01/2017    Years since quitting: 1.6  . Smokeless tobacco: Never Used  Substance Use Topics  . Alcohol use: No  . Drug use: No     Family Hx: The patient's family history includes Cancer in his father.  ROS:   Please see the history of present illness.    General:no colds or fevers, no weight changes Skin:no rashes or ulcers HEENT:no blurred vision, no congestion CV:see HPI PUL:see HPI GI:no diarrhea constipation or melena, no indigestion GU:no hematuria, no dysuria MS:no joint pain, no claudication Neuro:no syncope, no lightheadedness, one episode of room spinning dizziness.   Endo:no diabetes, no thyroid disease  All other systems reviewed and are negative.   Prior CV studies:   The following studies were reviewed today:  Echo  Study Conclusions  - Left ventricle: The cavity size was normal. Wall thickness was normal. Systolic function was normal. The estimated ejection fraction was in the range of 60% to 65%. Wall motion was normal; there were no regional wall  motion abnormalities. Features are consistent with a pseudonormal left ventricular filling pattern, with concomitant abnormal relaxation and increased filling pressure (grade 2 diastolic dysfunction). - Aortic valve: There was no stenosis. - Mitral valve: There was trivial regurgitation. - Right ventricle: The cavity size was normal. Pacer wire or catheter noted in right ventricle. Systolic function was normal. - Tricuspid valve: Peak RV-RA gradient (S): 28 mm Hg. - Pulmonary arteries: PA peak pressure: 31 mm Hg (S). - Inferior vena cava: The vessel was normal in size. The respirophasic diameter changes were in the normal range (>= 50%), consistent with normal central venous pressure.  Impressions:  - Normal LV size with EF 60-65%. Moderate diastolic dysfunction. Normal RV size and systolic function. No significant valvular abnormalities.  Cardiac  cath  Mid RCA lesion, 25 %stenosed.  Prox LAD to Mid LAD lesion, 20 %stenosed.  There is moderate left ventricular systolic dysfunction.  LV end diastolic pressure is normal.  The left ventricular ejection fraction is 35-45% by visual estimate.  1. Minor nonobstructive CAD 2. Moderate LV dysfunction. There a segmental wall motion abnormalities involving the mid ventricular segments and inferoapex. Consider Takotsubo syndrome. 3. Normal LVEDP.  Plan: EP evaluation for consideration of ICD  Carotid dopplers 01/31/19 stable bilateral carotid obst.  Summary: Right Carotid: Velocities in the right ICA are consistent with a 60-79%                stenosis.  Left Carotid: Velocities in the left ICA are consistent with a 1-39% stenosis.  Vertebrals:  Bilateral vertebral arteries demonstrate antegrade flow. Subclavians: Normal flow hemodynamics were seen in bilateral subclavian              arteries. Labs/Other Tests and Data Reviewed:    EKG:  reviewed from 09/12/18 SR with LAD and non specific ST changes but  no acute changes.  Recent Labs: No results found for requested labs within last 8760 hours.   Recent Lipid Panel Lab Results  Component Value Date/Time   CHOL 164 12/06/2017 02:01 PM   TRIG 420 (H) 12/06/2017 02:01 PM   HDL 40 12/06/2017 02:01 PM   CHOLHDL 4.1 12/06/2017 02:01 PM   LDLCALC Comment 12/06/2017 02:01 PM    Wt Readings from Last 3 Encounters:  09/12/18 206 lb 1.9 oz (93.5 kg)  12/06/17 196 lb 6.4 oz (89.1 kg)  09/07/17 192 lb 3.2 oz (87.2 kg)     Objective:    Vital Signs:  There were no vitals taken for this visit.   He could not take BP   VITAL SIGNS:  reviewed GEN:  no acute distress  Neuro A&O X 3 answers questions approp. Lungs, can complete sentences without SOB Psych pleasant affect   ASSESSMENT & PLAN:    1. Primary cardiac arrest with V fib, now with AICD followed by Dr. Graciela HusbandsKlein.  2. CAD though minimal on cath in 2018 3. PAD with PTA of LSFA  And increasing claudication.  Will do lower ext arterial dopplers and follow up with Dr. Allyson SabalBerry 4. DOE seems increased will repeat Echo 5. HLD on lipitor with goal LDL < 70   COVID-19 Education: The signs and symptoms of COVID-19 were discussed with the patient and how to seek care for testing (follow up with PCP or arrange E-visit).  The importance of social distancing was discussed today.  Time:   Today, I have spent 15 minutes with the patient with telehealth technology discussing the above problems.     Medication Adjustments/Labs and Tests Ordered: Current medicines are reviewed at length with the patient today.  Concerns regarding medicines are outlined above.   Tests Ordered: Orders Placed This Encounter  Procedures  . Lipid panel  . Comprehensive metabolic panel  . ECHOCARDIOGRAM COMPLETE    Medication Changes: Meds ordered this encounter  Medications  . atorvastatin (LIPITOR) 40 MG tablet    Sig: Take 1 tablet (40 mg total) by mouth daily.    Dispense:  90 tablet    Refill:  3  .  benazepril (LOTENSIN) 40 MG tablet    Sig: Take 0.5 tablets (20 mg total) by mouth at bedtime.    Dispense:  45 tablet    Refill:  3  . carvedilol (COREG) 3.125 MG tablet  Sig: TAKE 1 TABLET BY MOUTH TWICE A DAY WITH A MEAL    Dispense:  180 tablet    Refill:  3    Disposition:  Follow up in 1 month(s) for PV  Signed, Nada Boozer, NP  02/06/2019 3:38 PM    [facility]

## 2019-02-06 ENCOUNTER — Encounter: Payer: Self-pay | Admitting: Cardiology

## 2019-02-06 ENCOUNTER — Telehealth (INDEPENDENT_AMBULATORY_CARE_PROVIDER_SITE_OTHER): Payer: Medicare Other | Admitting: Cardiology

## 2019-02-06 ENCOUNTER — Other Ambulatory Visit: Payer: Self-pay

## 2019-02-06 DIAGNOSIS — I469 Cardiac arrest, cause unspecified: Secondary | ICD-10-CM

## 2019-02-06 DIAGNOSIS — I739 Peripheral vascular disease, unspecified: Secondary | ICD-10-CM

## 2019-02-06 DIAGNOSIS — R0602 Shortness of breath: Secondary | ICD-10-CM

## 2019-02-06 DIAGNOSIS — I6521 Occlusion and stenosis of right carotid artery: Secondary | ICD-10-CM

## 2019-02-06 DIAGNOSIS — I10 Essential (primary) hypertension: Secondary | ICD-10-CM

## 2019-02-06 MED ORDER — ATORVASTATIN CALCIUM 40 MG PO TABS: 40.0000 mg | ORAL_TABLET | Freq: Every day | ORAL | 3 refills | Status: DC

## 2019-02-06 MED ORDER — BENAZEPRIL HCL 40 MG PO TABS: 20.0000 mg | ORAL_TABLET | Freq: Every day | ORAL | 3 refills | Status: DC

## 2019-02-06 MED ORDER — CARVEDILOL 3.125 MG PO TABS: ORAL_TABLET | ORAL | 3 refills | Status: DC

## 2019-02-06 NOTE — Patient Instructions (Signed)
Medication Instructions:  Your physician recommends that you continue on your current medications as directed. Please refer to the Current Medication list given to you today.  If you need a refill on your cardiac medications before your next appointment, please call your pharmacy.   Lab work: To be done on 02/12/19: CMET, LIPIDS  If you have labs (blood work) drawn today and your tests are completely normal, you will receive your results only by: [name] MyChart Message (if you have MyChart) OR . A paper copy in the mail If you have any lab test that is abnormal or we need to change your treatment, we will call you to review the results.  Testing/Procedures: Your physician has requested that you have a lower arterial duplex. This test is an ultrasound of the arteries in the legs or arms. It looks at arterial blood flow in the legs and arms. Allow one hour for Lower and Upper Arterial scans. There are no restrictions or special instructions  Your physician has requested that you have an echocardiogram. Echocardiography is a painless test that uses sound waves to create images of your heart. It provides your doctor with information about the size and shape of your heart and how well your heart's chambers and valves are working. This procedure takes approximately one hour. There are no restrictions for this procedure.  Follow-Up: At [facility], you and your health needs are our priority.  As part of our continuing mission to provide you with exceptional heart care, we have created designated Provider Care Teams.  These Care Teams include your primary Cardiologist (physician) and Advanced Practice Providers (APPs -  Physician Assistants and Nurse Practitioners) who all work together to provide you with the care you need, when you need it. You will need a follow up appointment in 6 months.  Please call our office 2 months in advance to schedule this appointment.  You may see Lesleigh Noe, MD or one  of the following Advanced Practice Providers on your designated Care Team:   Norma Fredrickson, NP Nada Boozer, NP . Georgie Chard, NP  Your physician recommends that you schedule a follow-up appointment with Dr. Allyson Sabal after dopplers are complete.

## 2019-02-06 NOTE — Telephone Encounter (Signed)
Sounds vertiginous  No arrhythmia detected in on the device d

## 2019-02-12 ENCOUNTER — Telehealth: Payer: Self-pay | Admitting: Cardiology

## 2019-02-12 ENCOUNTER — Other Ambulatory Visit: Payer: Self-pay

## 2019-02-12 ENCOUNTER — Other Ambulatory Visit: Payer: Medicare Other | Admitting: *Deleted

## 2019-02-12 DIAGNOSIS — R0602 Shortness of breath: Secondary | ICD-10-CM

## 2019-02-12 DIAGNOSIS — I10 Essential (primary) hypertension: Secondary | ICD-10-CM

## 2019-02-12 LAB — LIPID PANEL
Chol/HDL Ratio: 6.1 ratio — ABNORMAL HIGH (ref 0.0–5.0)
Cholesterol, Total: 176 mg/dL (ref 100–199)
HDL: 29 mg/dL — ABNORMAL LOW (ref >39)
Triglycerides: 740 mg/dL (ref 0–149)

## 2019-02-12 LAB — COMPREHENSIVE METABOLIC PANEL
ALT: 52 IU/L — ABNORMAL HIGH (ref 0–44)
AST: 36 IU/L (ref 0–40)
Albumin/Globulin Ratio: 2 (ref 1.2–2.2)
Albumin: 4.1 g/dL (ref 3.8–4.8)
Alkaline Phosphatase: 130 IU/L — ABNORMAL HIGH (ref 39–117)
BUN/Creatinine Ratio: 11 (ref 10–24)
BUN: 10 mg/dL (ref 8–27)
Bilirubin Total: 0.5 mg/dL (ref 0.0–1.2)
CO2: 22 mmol/L (ref 20–29)
Calcium: 9.2 mg/dL (ref 8.6–10.2)
Chloride: 100 mmol/L (ref 96–106)
Creatinine, Ser: 0.93 mg/dL (ref 0.76–1.27)
GFR calc Af Amer: 99 mL/min/{1.73_m2} (ref >59)
GFR calc non Af Amer: 86 mL/min/{1.73_m2} (ref >59)
Globulin, Total: 2.1 g/dL (ref 1.5–4.5)
Glucose: 144 mg/dL — ABNORMAL HIGH (ref 65–99)
Potassium: 4 mmol/L (ref 3.5–5.2)
Sodium: 134 mmol/L (ref 134–144)
Total Protein: 6.2 g/dL (ref 6.0–8.5)

## 2019-02-12 NOTE — Telephone Encounter (Signed)
Excellent

## 2019-02-12 NOTE — Telephone Encounter (Signed)
Patient came in for lab work and was able to get his BP: 113/61 and HR: 72.

## 2019-02-14 ENCOUNTER — Telehealth: Payer: Self-pay | Admitting: Interventional Cardiology

## 2019-02-14 NOTE — Telephone Encounter (Signed)
New Message  02-14-19/10:55am Left Pt detailed voice on yesterday (02-13-19 approx. 4:30pm) and today, [id number] appts., date, and time at [facility]./ renee val

## 2019-02-14 NOTE — Telephone Encounter (Signed)
Follow up  ° ° °Pt is returning call  ° ° °Please call back  °

## 2019-02-16 ENCOUNTER — Other Ambulatory Visit: Payer: Self-pay | Admitting: Cardiology

## 2019-02-16 DIAGNOSIS — I739 Peripheral vascular disease, unspecified: Secondary | ICD-10-CM

## 2019-02-22 ENCOUNTER — Encounter (HOSPITAL_COMMUNITY): Payer: Medicare Other

## 2019-02-26 ENCOUNTER — Ambulatory Visit (HOSPITAL_COMMUNITY)
Admission: RE | Admit: 2019-02-26 | Discharge: 2019-02-26 | Disposition: A | Discharge status: Home / Self Care | Payer: Medicare Other | Source: Ambulatory Visit | Attending: Cardiovascular Disease | Admitting: Cardiovascular Disease

## 2019-02-26 ENCOUNTER — Other Ambulatory Visit: Payer: Self-pay

## 2019-02-26 DIAGNOSIS — I739 Peripheral vascular disease, unspecified: Secondary | ICD-10-CM | POA: Diagnosis not present

## 2019-02-26 DIAGNOSIS — I6521 Occlusion and stenosis of right carotid artery: Secondary | ICD-10-CM | POA: Diagnosis not present

## 2019-02-27 ENCOUNTER — Encounter (HOSPITAL_COMMUNITY): Payer: Medicare Other

## 2019-03-02 ENCOUNTER — Other Ambulatory Visit: Payer: Self-pay

## 2019-03-02 ENCOUNTER — Encounter: Payer: Self-pay | Admitting: Cardiovascular Disease

## 2019-03-02 ENCOUNTER — Ambulatory Visit (INDEPENDENT_AMBULATORY_CARE_PROVIDER_SITE_OTHER): Payer: Medicare Other | Admitting: Cardiovascular Disease

## 2019-03-02 VITALS — BP 135/77 | HR 62 | Temp 97.7°F | Ht 72.0 in | Wt 208.0 lb

## 2019-03-02 DIAGNOSIS — I739 Peripheral vascular disease, unspecified: Secondary | ICD-10-CM | POA: Diagnosis not present

## 2019-03-02 DIAGNOSIS — E781 Pure hyperglyceridemia: Secondary | ICD-10-CM | POA: Diagnosis not present

## 2019-03-02 DIAGNOSIS — I10 Essential (primary) hypertension: Secondary | ICD-10-CM

## 2019-03-02 NOTE — H&P (View-Only) (Signed)
03/02/2019 Rick Logan   05-28-1956  [id number]  Primary Physician [name] Cardiologist: Runell Gess MD [name], [place], [place]  HPI:  Rick Logan is a 63 y.o.  father of 3 children, grandfather and 8 grandchildren referred by Dr. Doristine Counter for peripheral vascular evaluation because of symptomatic claudication. 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 His cardiovascular risk factor profile is notable for 40-pack-years of tobacco abuse currently smoking one pack per day. There is no family history of heart disease. He has never had a heart attack or stroke. He denies chest pain or shortness of breath. He's noticed claudication for the last 3 months in his left calf particularly notable when walking up an incline. He was noted by his primary care physician to have an absent left pedal pulse and was referred here for further evaluation. His Dopplers showed a high-frequency signal in his proximal left SFA with a decreased ABI. He underwent peripheral angiography and intervention by myself 08/28/15 with directional atherectomy of his proximal and mid left SFA. He had 3 vessel runoff on that side. His follow-up Dopplers performed today revealed a widely patent SFA with a left ABI of 1. His claudication has resolved.  He had an episode of sudden cardiac death Jun 07, 2017 from ventricular fibrillation underwent cardiac catheterization by [place] revealing minimal CAD slightly reduced LV function which ultimately normalized several months later.  He subsequently received an ICD by Dr. Graciela Husbands who follows that.  He denies chest pain or shortness of breath.  His major complaint is of left calf claudication which began around that time.  He did stop smoking on the day of his sudden cardiac death episode.  Recent Doppler studies performed 02/26/2019 revealed a right ABI ABI that was normal and a left  ABI of 0.79 with an occluded left SFA.   Current Meds  Medication Sig  . aspirin EC 81 MG tablet Take 81 mg by mouth daily.  [name] atorvastatin (LIPITOR) 40 MG tablet Take 1 tablet (40 mg total) by mouth daily.  . benazepril (LOTENSIN) 40 MG tablet Take 0.5 tablets (20 mg total) by mouth at bedtime.  . carvedilol (COREG) 3.125 MG tablet TAKE 1 TABLET BY MOUTH TWICE A DAY WITH A MEAL  . famotidine (PEPCID) 20 MG tablet Take 1 tablet by mouth daily.  [name] omeprazole (PRILOSEC) 40 MG capsule Take 40 mg by mouth 2 (two) times a day.     No Known Allergies  Social History   Socioeconomic History  . Marital status: Married    Spouse name: Not on file  . Number of children: Not on file  . Years of education: Not on file  . Highest education level: Not on file  Occupational History  . Not on file  Social Needs  . Financial resource strain: Not on file  . Food insecurity:    Worry: Not on file    Inability: Not on file  . Transportation needs:    Medical: Not on file    Non-medical: Not on file  Tobacco Use  . Smoking status: Former Smoker    Packs/day: 1.00    Years: 41.00    Pack years: 41.00    Types: Cigarettes    Last attempt to quit: 06-07-2017    Years since quitting: 1.7  . Smokeless tobacco: Never Used  Substance and Sexual Activity  . Alcohol use: No  .  Drug use: No  . Sexual activity: Not Currently  Lifestyle  . Physical activity:    Days per week: Not on file    Minutes per session: Not on file  . Stress: Not on file  Relationships  . Social connections:    Talks on phone: Not on file    Gets together: Not on file    Attends religious service: Not on file    Active member of club or organization: Not on file    Attends meetings of clubs or organizations: Not on file    Relationship status: Not on file  . Intimate partner violence:    Fear of current or ex partner: Not on file    Emotionally abused: Not on file    Physically abused: Not on file    Forced sexual  activity: Not on file  Other Topics Concern  . Not on file  Social History Narrative   ** Merged History Encounter **         Review of Systems: General: negative for chills, fever, night sweats or weight changes.  Cardiovascular: negative for chest pain, dyspnea on exertion, edema, orthopnea, palpitations, paroxysmal nocturnal dyspnea or shortness of breath Dermatological: negative for rash Respiratory: negative for cough or wheezing Urologic: negative for hematuria Abdominal: negative for nausea, vomiting, diarrhea, bright red blood per rectum, melena, or hematemesis Neurologic: negative for visual changes, syncope, or dizziness All other systems reviewed and are otherwise negative except as noted above.    Blood pressure 135/77, pulse 62, temperature 97.7 F (36.5 C), height 6' (1.829 m), weight 208 lb (94.3 kg).  General appearance: alert and no distress Neck: no adenopathy, no carotid bruit, no JVD, supple, symmetrical, trachea midline and thyroid not enlarged, symmetric, no tenderness/mass/nodules Lungs: clear to auscultation bilaterally Heart: regular rate and rhythm, S1, S2 normal, no murmur, click, rub or gallop Extremities: extremities normal, atraumatic, no cyanosis or edema Pulses: Diminished left pedal pulse Skin: Skin color, texture, turgor normal. No rashes or lesions Neurologic: Alert and oriented X 3, normal strength and tone. Normal symmetric reflexes. Normal coordination and gait  EKG normal sinus rhythm at 62 with inferior Q waves and nonspecific ST and T wave changes.  I personally reviewed this EKG.  ASSESSMENT AND PLAN:   PVD (peripheral vascular disease) (HCC) Mr. Savitz  has known PAD status post directional atherectomy and drug-coated balloon angioplasty by myself of his proximal and mid left SFA 08/28/2015.  He had excellent angiographic and clinical result.  His Dopplers normalized.  He has had recurrent left calf claudication over the last several  years since his episode of sudden cardiac death with recent Dopplers performed 02/26/2019 which revealed a right ABI of 1.01 and a left 0.79 with an occluded left SFA.  He will need angiography and potential re-intervention.      Rakisha Pincock J. Suzi Hernan MD FACP,FACC,FAHA, FSCAI 03/02/2019 2:49 PM 

## 2019-03-02 NOTE — Progress Notes (Signed)
03/02/2019 Rick Logan   05-28-1956  [id number]  Primary Physician [facility] Cardiologist: Rick Gess MD Rick Logan, [place], [place]  HPI:  Rick Logan is a 63 y.o.  father of 3 children, grandfather and 8 grandchildren referred by Dr. Doristine Logan for peripheral vascular evaluation because of symptomatic claudication. 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 His cardiovascular risk factor profile is notable for 40-pack-years of tobacco abuse currently smoking one pack per day. There is no family history of heart disease. He has never had a heart attack or stroke. He denies chest pain or shortness of breath. He's noticed claudication for the last 3 months in his left calf particularly notable when walking up an incline. He was noted by his primary care physician to have an absent left pedal pulse and was referred here for further evaluation. His Dopplers showed a high-frequency signal in his proximal left SFA with a decreased ABI. He underwent peripheral angiography and intervention by myself 08/28/15 with directional atherectomy of his proximal and mid left SFA. He had 3 vessel runoff on that side. His follow-up Dopplers performed today revealed a widely patent SFA with a left ABI of 1. His claudication has resolved.  He had an episode of sudden cardiac death Jun 07, 2017 from ventricular fibrillation underwent cardiac catheterization by Rick Logan revealing minimal CAD slightly reduced LV function which ultimately normalized several months later.  He subsequently received an ICD by Rick Logan who follows that.  He denies chest pain or shortness of breath.  His major complaint is of left calf claudication which began around that time.  He did stop smoking on the day of his sudden cardiac death episode.  Recent Doppler studies performed 02/26/2019 revealed a right ABI ABI that was normal and a left  ABI of 0.79 with an occluded left SFA.   Current Meds  Medication Sig  . aspirin EC 81 MG tablet Take 81 mg by mouth daily.  [name] atorvastatin (LIPITOR) 40 MG tablet Take 1 tablet (40 mg total) by mouth daily.  . benazepril (LOTENSIN) 40 MG tablet Take 0.5 tablets (20 mg total) by mouth at bedtime.  . carvedilol (COREG) 3.125 MG tablet TAKE 1 TABLET BY MOUTH TWICE A DAY WITH A MEAL  . famotidine (PEPCID) 20 MG tablet Take 1 tablet by mouth daily.  [name] omeprazole (PRILOSEC) 40 MG capsule Take 40 mg by mouth 2 (two) times a day.     No Known Allergies  Social History   Socioeconomic History  . Marital status: Married    Spouse name: Not on file  . Number of children: Not on file  . Years of education: Not on file  . Highest education level: Not on file  Occupational History  . Not on file  Social Needs  . Financial resource strain: Not on file  . Food insecurity:    Worry: Not on file    Inability: Not on file  . Transportation needs:    Medical: Not on file    Non-medical: Not on file  Tobacco Use  . Smoking status: Former Smoker    Packs/day: 1.00    Years: 41.00    Pack years: 41.00    Types: Cigarettes    Last attempt to quit: 06-07-2017    Years since quitting: 1.7  . Smokeless tobacco: Never Used  Substance and Sexual Activity  . Alcohol use: No  .  Drug use: No  . Sexual activity: Not Currently  Lifestyle  . Physical activity:    Days per week: Not on file    Minutes per session: Not on file  . Stress: Not on file  Relationships  . Social connections:    Talks on phone: Not on file    Gets together: Not on file    Attends religious service: Not on file    Active member of club or organization: Not on file    Attends meetings of clubs or organizations: Not on file    Relationship status: Not on file  . Intimate partner violence:    Fear of current or ex partner: Not on file    Emotionally abused: Not on file    Physically abused: Not on file    Forced sexual  activity: Not on file  Other Topics Concern  . Not on file  Social History Narrative   ** Merged History Encounter **         Review of Systems: General: negative for chills, fever, night sweats or weight changes.  Cardiovascular: negative for chest pain, dyspnea on exertion, edema, orthopnea, palpitations, paroxysmal nocturnal dyspnea or shortness of breath Dermatological: negative for rash Respiratory: negative for cough or wheezing Urologic: negative for hematuria Abdominal: negative for nausea, vomiting, diarrhea, bright red blood per rectum, melena, or hematemesis Neurologic: negative for visual changes, syncope, or dizziness All other systems reviewed and are otherwise negative except as noted above.    Blood pressure 135/77, pulse 62, temperature 97.7 F (36.5 C), height 6' (1.829 m), weight 208 lb (94.3 kg).  General appearance: alert and no distress Neck: no adenopathy, no carotid bruit, no JVD, supple, symmetrical, trachea midline and thyroid not enlarged, symmetric, no tenderness/mass/nodules Lungs: clear to auscultation bilaterally Heart: regular rate and rhythm, S1, S2 normal, no murmur, click, rub or gallop Extremities: extremities normal, atraumatic, no cyanosis or edema Pulses: Diminished left pedal pulse Skin: Skin color, texture, turgor normal. No rashes or lesions Neurologic: Alert and oriented X 3, normal strength and tone. Normal symmetric reflexes. Normal coordination and gait  EKG normal sinus rhythm at 62 with inferior Q waves and nonspecific ST and T wave changes.  I personally reviewed this EKG.  ASSESSMENT AND PLAN:   PVD (peripheral vascular disease) [facility]) Rick Logan  has known PAD status post directional atherectomy and drug-coated balloon angioplasty by myself of his proximal and mid left SFA 08/28/2015.  He had excellent angiographic and clinical result.  His Dopplers normalized.  He has had recurrent left calf claudication over the last several  years since his episode of sudden cardiac death with recent Dopplers performed 02/26/2019 which revealed a right ABI of 1.01 and a left 0.79 with an occluded left SFA.  He will need angiography and potential re-intervention.      Rick Gess MD FACP,FACC,FAHA, [facility] 03/02/2019 2:49 PM

## 2019-03-02 NOTE — Patient Instructions (Addendum)
    [facility]ASCULAR DIV[facility] [address] [place] [address] Dept: [phone] Loc: [phone]  JARY HAMMANS  03/02/2019  You are scheduled for a Peripheral Angiogram on Thursday, June 11 with Dr. Nanetta Batty.  1. Please arrive at the [facility] (Main Entrance A) at [facility]: [address] [place], [place] [address] at 7:30 AM (This time is two hours before your procedure to ensure your preparation). Free valet parking service is available.   Special note: Every effort is made to have your procedure done on time. Please understand that emergencies sometimes delay scheduled procedures.  2. Diet: Do not eat solid foods after midnight.  The patient may have clear liquids until 5am upon the day of the procedure.  3. Labs: You will need to have blood drawn today: CBC and BMP      You will need to have a COVID-19 test. Go to to this location:  [facility]  [address] [place], [place] [address] FOR YOUR COVID-19 TEST. YOUR APPOINTMENT IS ON 03/05/2019 AT 10:40AM. YOU WILL ALSO NEED TO QUARANTINE YOURSELF AFTER THE COVID-19 TEST UNTIL YOU RECEIVE YOUR RESULTS (NEGATIVE RESULTS). YOU SHOULD RECEIVE YOUR RESULTS IN 48 HOURS OR LESS.   4. Medication instructions in preparation for your procedure:   On the morning of your procedure, take your Aspirin and any morning medicines NOT listed above.  You may use sips of water.  5. Plan for one night stay--bring personal belongings. 6. Bring a current list of your medications and current insurance cards. 7. You MUST have a responsible person to drive you home. 8. Someone MUST be with you the first 24 hours after you arrive home or your discharge will be delayed. 9. Please wear clothes that are easy to get on and off and wear slip-on shoes.  Thank you for allowing [place] to care for you!   -- [facility] In[facility] Cardi[facility] services   Follow-Up: At [facility], you and your health needs are our priority.  As part of our continuing mission to provide you with exceptional heart care, we have created designated Provider Care Teams.  These Care Teams include your primary Cardiologist (physician) and Advanced Practice Providers (APPs -  Physician Assistants and Nurse Practitioners) who all work together to provide you with the care you need, when you need it. You will need a follow up appointment in 2-3 weeks WITH DR. Allyson Sabal.     Any Other Special Instructions Will Be Listed Below (If Applicable). REFERRAL TO DR. Iantha Fallen [facility] IN LIPID DISORDERS CLINIC

## 2019-03-02 NOTE — Assessment & Plan Note (Signed)
Mr. Cerbone  has known PAD status post directional atherectomy and drug-coated balloon angioplasty by myself of his proximal and mid left SFA 08/28/2015.  He had excellent angiographic and clinical result.  His Dopplers normalized.  He has had recurrent left calf claudication over the last several years since his episode of sudden cardiac death with recent Dopplers performed 02/26/2019 which revealed a right ABI of 1.01 and a left 0.79 with an occluded left SFA.  He will need angiography and potential re-intervention.

## 2019-03-03 ENCOUNTER — Other Ambulatory Visit: Payer: Self-pay

## 2019-03-03 DIAGNOSIS — I739 Peripheral vascular disease, unspecified: Secondary | ICD-10-CM

## 2019-03-03 LAB — BASIC METABOLIC PANEL
BUN/Creatinine Ratio: 13 (ref 10–24)
BUN: 14 mg/dL (ref 8–27)
CO2: 22 mmol/L (ref 20–29)
Calcium: 9.5 mg/dL (ref 8.6–10.2)
Chloride: 102 mmol/L (ref 96–106)
Creatinine, Ser: 1.1 mg/dL (ref 0.76–1.27)
GFR calc Af Amer: 81 mL/min/{1.73_m2} (ref >59)
GFR calc non Af Amer: 70 mL/min/{1.73_m2} (ref >59)
Glucose: 101 mg/dL — ABNORMAL HIGH (ref 65–99)
Potassium: 4.6 mmol/L (ref 3.5–5.2)
Sodium: 138 mmol/L (ref 134–144)

## 2019-03-03 LAB — CBC
Hematocrit: 41.6 % (ref 37.5–51.0)
Hemoglobin: 14.4 g/dL (ref 13.0–17.7)
MCH: 31 pg (ref 26.6–33.0)
MCHC: 34.6 g/dL (ref 31.5–35.7)
MCV: 90 fL (ref 79–97)
Platelets: 247 10*3/uL (ref 150–450)
RBC: 4.65 x10E6/uL (ref 4.14–5.80)
RDW: 12.8 % (ref 11.6–15.4)
WBC: 6.9 10*3/uL (ref 3.4–10.8)

## 2019-03-05 ENCOUNTER — Other Ambulatory Visit (HOSPITAL_COMMUNITY)
Admission: RE | Admit: 2019-03-05 | Discharge: 2019-03-05 | Disposition: A | Discharge status: Home / Self Care | Payer: Medicare Other | Source: Ambulatory Visit | Attending: Cardiovascular Disease | Admitting: Cardiovascular Disease

## 2019-03-05 ENCOUNTER — Other Ambulatory Visit: Payer: Self-pay

## 2019-03-05 DIAGNOSIS — Z1159 Encounter for screening for other viral diseases: Secondary | ICD-10-CM | POA: Diagnosis not present

## 2019-03-06 LAB — NOVEL CORONAVIRUS, NAA (HOSP ORDER, SEND-OUT TO REF LAB; TAT 18-24 HRS): SARS-CoV-2, NAA: NOT DETECTED

## 2019-03-07 ENCOUNTER — Telehealth: Payer: Self-pay | Admitting: *Deleted

## 2019-03-07 ENCOUNTER — Ambulatory Visit (INDEPENDENT_AMBULATORY_CARE_PROVIDER_SITE_OTHER): Payer: Medicare Other | Admitting: *Deleted

## 2019-03-07 DIAGNOSIS — I469 Cardiac arrest, cause unspecified: Secondary | ICD-10-CM | POA: Diagnosis not present

## 2019-03-07 DIAGNOSIS — I4901 Ventricular fibrillation: Secondary | ICD-10-CM

## 2019-03-07 DIAGNOSIS — I5022 Chronic systolic (congestive) heart failure: Secondary | ICD-10-CM

## 2019-03-07 NOTE — Telephone Encounter (Signed)
Pt contacted pre-abdominal aortogram  scheduled at [facility] for: Thursday March 08, 2019 9:30 AM Verified arrival time and place: [facility] Entrance A at: 7:30 AM  Covid-19 test date: 03/05/19  No solid food after midnight prior to cath, clear liquids until 5 AM day of procedure. Contrast allergy: no  AM meds can be  taken pre-cath with sip of water including: ASA 81 mg  Confirmed patient has responsible person to drive home post procedure and observe 24 hours after arriving home: yes  Due to Covid-19 pandemic no visitors are allowed in the hospital (unless cognitive impairment).  Their designated party will be called when their procedure is over for an update and to arrange pick up.  Patients are required to wear a mask when they enter the hospital.       COVID-19 Pre-Screening Questions:  . In the past 7 to 10 days have you had a cough,  shortness of breath, headache, congestion, fever (100 or greater) body aches, chills, sore throat, or sudden loss of taste or sense of smell? no . Have you been around anyone with known Covid 19? no . Have you been around anyone who is awaiting Covid 19 test results in the past 7 to 10 days? no . Have you been around anyone who has been exposed to Covid 19, or has mentioned symptoms of Covid 19 within the past 7 to 10 days? no  I reviewed procedure instructions /visitor/mask/Covid-19 questions with patient, he verbalized understanding, thanked me for call.

## 2019-03-08 ENCOUNTER — Encounter (HOSPITAL_COMMUNITY): Payer: Self-pay | Admitting: Cardiovascular Disease

## 2019-03-08 ENCOUNTER — Ambulatory Visit (HOSPITAL_COMMUNITY)
Admission: RE | Admit: 2019-03-08 | Discharge: 2019-03-08 | Disposition: A | Discharge status: Home / Self Care | Payer: Medicare Other | Attending: Cardiovascular Disease | Admitting: Cardiovascular Disease

## 2019-03-08 ENCOUNTER — Other Ambulatory Visit: Payer: Self-pay

## 2019-03-08 ENCOUNTER — Encounter (HOSPITAL_COMMUNITY)
Admission: RE | Disposition: A | Discharge status: Home / Self Care | Payer: Self-pay | Source: Home / Self Care | Attending: Cardiovascular Disease

## 2019-03-08 DIAGNOSIS — Z8249 Family history of ischemic heart disease and other diseases of the circulatory system: Secondary | ICD-10-CM | POA: Diagnosis not present

## 2019-03-08 DIAGNOSIS — Z8674 Personal history of sudden cardiac arrest: Secondary | ICD-10-CM | POA: Insufficient documentation

## 2019-03-08 DIAGNOSIS — I2510 Atherosclerotic heart disease of native coronary artery without angina pectoris: Secondary | ICD-10-CM | POA: Diagnosis not present

## 2019-03-08 DIAGNOSIS — Z79899 Other long term (current) drug therapy: Secondary | ICD-10-CM | POA: Diagnosis not present

## 2019-03-08 DIAGNOSIS — F17210 Nicotine dependence, cigarettes, uncomplicated: Secondary | ICD-10-CM | POA: Insufficient documentation

## 2019-03-08 DIAGNOSIS — Z7982 Long term (current) use of aspirin: Secondary | ICD-10-CM | POA: Diagnosis not present

## 2019-03-08 DIAGNOSIS — I70212 Atherosclerosis of native arteries of extremities with intermittent claudication, left leg: Secondary | ICD-10-CM | POA: Diagnosis not present

## 2019-03-08 DIAGNOSIS — I739 Peripheral vascular disease, unspecified: Secondary | ICD-10-CM

## 2019-03-08 DIAGNOSIS — I70202 Unspecified atherosclerosis of native arteries of extremities, left leg: Secondary | ICD-10-CM | POA: Diagnosis not present

## 2019-03-08 DIAGNOSIS — I7092 Chronic total occlusion of artery of the extremities: Secondary | ICD-10-CM | POA: Diagnosis not present

## 2019-03-08 HISTORY — PX: ABDOMINAL AORTOGRAM W/LOWER EXTREMITY: CATH118223

## 2019-03-08 LAB — CUP PACEART REMOTE DEVICE CHECK
Battery Remaining Longevity: 127 mo
Battery Voltage: 3.01 V
Brady Statistic RV Percent Paced: 0.01 %
Date Time Interrogation Session: 20200610203827
HighPow Impedance: 91 Ohm
Implantable Lead Implant Date: 20180910
Implantable Lead Location: 753860
Implantable Pulse Generator Implant Date: 20180910
Lead Channel Impedance Value: 532 Ohm
Lead Channel Impedance Value: 608 Ohm
Lead Channel Pacing Threshold Amplitude: 0.625 V
Lead Channel Pacing Threshold Pulse Width: 0.4 ms
Lead Channel Sensing Intrinsic Amplitude: 6.25 mV
Lead Channel Sensing Intrinsic Amplitude: 6.25 mV
Lead Channel Setting Pacing Amplitude: 2.5 V
Lead Channel Setting Pacing Pulse Width: 0.4 ms
Lead Channel Setting Sensing Sensitivity: 0.3 mV

## 2019-03-08 SURGERY — ABDOMINAL AORTOGRAM W/LOWER EXTREMITY
Anesthesia: LOCAL | Laterality: Bilateral

## 2019-03-08 MED ORDER — FENTANYL CITRATE (PF) 100 MCG/2ML IJ SOLN
INTRAMUSCULAR | Status: AC
  Filled 2019-03-08: qty 2

## 2019-03-08 MED ORDER — FENTANYL CITRATE (PF) 100 MCG/2ML IJ SOLN
INTRAMUSCULAR | Status: DC | PRN
  Administered 2019-03-08: 25 ug via INTRAVENOUS

## 2019-03-08 MED ORDER — MIDAZOLAM HCL 2 MG/2ML IJ SOLN
INTRAMUSCULAR | Status: AC
  Filled 2019-03-08: qty 2

## 2019-03-08 MED ORDER — SODIUM CHLORIDE 0.9% FLUSH: 3.0000 mL | Freq: Two times a day (BID) | INTRAVENOUS | Status: DC

## 2019-03-08 MED ORDER — SODIUM CHLORIDE 0.9 % IV SOLN: 250.0000 mL | INTRAVENOUS | Status: DC | PRN

## 2019-03-08 MED ORDER — MIDAZOLAM HCL 2 MG/2ML IJ SOLN
INTRAMUSCULAR | Status: DC | PRN
  Administered 2019-03-08: 1 mg via INTRAVENOUS

## 2019-03-08 MED ORDER — SODIUM CHLORIDE 0.9% FLUSH: 3.0000 mL | INTRAVENOUS | Status: DC | PRN

## 2019-03-08 MED ORDER — LIDOCAINE HCL (PF) 1 % IJ SOLN
INTRAMUSCULAR | Status: DC | PRN
  Administered 2019-03-08: 15 mL via INTRADERMAL

## 2019-03-08 MED ORDER — HYDRALAZINE HCL 20 MG/ML IJ SOLN: 5.0000 mg | INTRAMUSCULAR | Status: DC | PRN

## 2019-03-08 MED ORDER — SODIUM CHLORIDE 0.9 % IV SOLN: INTRAVENOUS | Status: DC

## 2019-03-08 MED ORDER — HEPARIN (PORCINE) IN NACL 1000-0.9 UT/500ML-% IV SOLN
INTRAVENOUS | Status: AC
  Filled 2019-03-08: qty 1000

## 2019-03-08 MED ORDER — IODIXANOL 320 MG/ML IV SOLN
INTRAVENOUS | Status: DC | PRN
  Administered 2019-03-08: 80 mL via INTRA_ARTERIAL

## 2019-03-08 MED ORDER — ASPIRIN 81 MG PO CHEW: 81.0000 mg | CHEWABLE_TABLET | ORAL | Status: DC

## 2019-03-08 MED ORDER — SODIUM CHLORIDE 0.9 % WEIGHT BASED INFUSION
3.0000 mL/kg/h | INTRAVENOUS | Status: AC
  Administered 2019-03-08: 08:00:00 3 mL/kg/h via INTRAVENOUS

## 2019-03-08 MED ORDER — ONDANSETRON HCL 4 MG/2ML IJ SOLN: 4.0000 mg | Freq: Four times a day (QID) | INTRAMUSCULAR | Status: DC | PRN

## 2019-03-08 MED ORDER — SODIUM CHLORIDE 0.9 % WEIGHT BASED INFUSION: 1.0000 mL/kg/h | INTRAVENOUS | Status: DC

## 2019-03-08 MED ORDER — HEPARIN (PORCINE) IN NACL 1000-0.9 UT/500ML-% IV SOLN
INTRAVENOUS | Status: DC | PRN
  Administered 2019-03-08 (×2): 500 mL

## 2019-03-08 MED ORDER — LIDOCAINE HCL (PF) 1 % IJ SOLN
INTRAMUSCULAR | Status: AC
  Filled 2019-03-08: qty 30

## 2019-03-08 MED ORDER — LABETALOL HCL 5 MG/ML IV SOLN: 10.0000 mg | INTRAVENOUS | Status: DC | PRN

## 2019-03-08 MED ORDER — ASPIRIN EC 81 MG PO TBEC: 81.0000 mg | DELAYED_RELEASE_TABLET | Freq: Every day | ORAL | Status: DC

## 2019-03-08 MED ORDER — ACETAMINOPHEN 325 MG PO TABS: 650.0000 mg | ORAL_TABLET | ORAL | Status: DC | PRN

## 2019-03-08 SURGICAL SUPPLY — 13 items
CATH ANGIO 5F PIGTAIL 65CM (CATHETERS) ×1 IMPLANT
CATH CROSS OVER TEMPO 5F (CATHETERS) ×1 IMPLANT
CATH STRAIGHT 5FR 65CM (CATHETERS) ×1 IMPLANT
CLOSURE MYNX CONTROL 5F (Vascular Products) ×1 IMPLANT
KIT PV (KITS) ×2 IMPLANT
SHEATH PINNACLE 5F 10CM (SHEATH) ×1 IMPLANT
SHEATH PROBE COVER 6X72 (BAG) ×1 IMPLANT
STOPCOCK MORSE 400PSI 3WAY (MISCELLANEOUS) ×1 IMPLANT
SYR MEDRAD MARK 7 150ML (SYRINGE) ×2 IMPLANT
TRANSDUCER W/STOPCOCK (MISCELLANEOUS) ×2 IMPLANT
TRAY PV CATH (CUSTOM PROCEDURE TRAY) ×2 IMPLANT
TUBING CIL FLEX 10 FLL-RA (TUBING) ×1 IMPLANT
WIRE HITORQ VERSACORE ST 145CM (WIRE) ×1 IMPLANT

## 2019-03-08 NOTE — Progress Notes (Signed)
Up and walked and tolerated well; right groin stable, no bleeding or hematoma 

## 2019-03-08 NOTE — Discharge Instructions (Signed)
Femoral Site Care °This sheet gives you information about how to care for yourself after your procedure. Your health care provider may also give you more specific instructions. If you have problems or questions, contact your health care provider. °What can I expect after the procedure? °After the procedure, it is common to have: °· Bruising that usually fades within 1-2 weeks. °· Tenderness at the site. °Follow these instructions at home: °Wound care °· Follow instructions from your health care provider about how to take care of your insertion site. Make sure you: °? Wash your hands with soap and water before you change your bandage (dressing). If soap and water are not available, use hand sanitizer. °? Change your dressing as told by your health care provider. °? Leave stitches (sutures), skin glue, or adhesive strips in place. These skin closures may need to stay in place for 2 weeks or longer. If adhesive strip edges start to loosen and curl up, you may trim the loose edges. Do not remove adhesive strips completely unless your health care provider tells you to do that. °· Do not take baths, swim, or use a hot tub until your health care provider approves. °· You may shower 24-48 hours after the procedure or as told by your health care provider. °? Gently wash the site with plain soap and water. °? Pat the area dry with a clean towel. °? Do not rub the site. This may cause bleeding. °· Do not apply powder or lotion to the site. Keep the site clean and dry. °· Check your femoral site every day for signs of infection. Check for: °? Redness, swelling, or pain. °? Fluid or blood. °? Warmth. °? Pus or a bad smell. °Activity °· For the first 2-3 days after your procedure, or as long as directed: °? Avoid climbing stairs as much as possible. °? Do not squat. °· Do not lift anything that is heavier than 10 lb (4.5 kg), or the limit that you are told, until your health care provider says that it is safe. °· Rest as  directed. °? Avoid sitting for a long time without moving. Get up to take short walks every 1-2 hours. °· Do not drive for 24 hours if you were given a medicine to help you relax (sedative). °General instructions °· Take over-the-counter and prescription medicines only as told by your health care provider. °· Keep all follow-up visits as told by your health care provider. This is important. °Contact a health care provider if you have: °· A fever or chills. °· You have redness, swelling, or pain around your insertion site. °Get help right away if: °· The catheter insertion area swells very fast. °· You pass out. °· You suddenly start to sweat or your skin gets clammy. °· The catheter insertion area is bleeding, and the bleeding does not stop when you hold steady pressure on the area. °· The area near or just beyond the catheter insertion site becomes pale, cool, tingly, or numb. °These symptoms may represent a serious problem that is an emergency. Do not wait to see if the symptoms will go away. Get medical help right away. Call your local emergency services (911 in the [place] Do not drive yourself to the hospital. °Summary °· After the procedure, it is common to have bruising that usually fades within 1-2 weeks. °· Check your femoral site every day for signs of infection. °· Do not lift anything that is heavier than 10 lb (4.5 kg), or the   limit that you are told, until your health care provider says that it is safe. °This information is not intended to replace advice given to you by your health care provider. Make sure you discuss any questions you have with your health care provider. °Document Released: 05/17/2014 Document Revised: 09/26/2017 Document Reviewed: 09/26/2017 °Elsevier Interactive Patient Education © 2019 [employer] Inc[employer] ° °Moderate Conscious Sedation, Adult, Care After °These instructions provide you with information about caring for yourself after your procedure. Your health care provider may also give  you more specific instructions. Your treatment has been planned according to current medical practices, but problems sometimes occur. Call your health care provider if you have any problems or questions after your procedure. °What can I expect after the procedure? °After your procedure, it is common: °· To feel sleepy for several hours. °· To feel clumsy and have poor balance for several hours. °· To have poor judgment for several hours. °· To vomit if you eat too soon. °Follow these instructions at home: °For at least 24 hours after the procedure: ° °· Do not: °? Participate in activities where you could fall or become injured. °? Drive. °? Use heavy machinery. °? Drink alcohol. °? Take sleeping pills or medicines that cause drowsiness. °? Make important decisions or sign legal documents. °? Take care of children on your own. °· Rest. °Eating and drinking °· Follow the diet recommended by your health care provider. °· If you vomit: °? Drink water, juice, or soup when you can drink without vomiting. °? Make sure you have little or no nausea before eating solid foods. °General instructions °· Have a responsible adult stay with you until you are awake and alert. °· Take over-the-counter and prescription medicines only as told by your health care provider. °· If you smoke, do not smoke without supervision. °· Keep all follow-up visits as told by your health care provider. This is important. °Contact a health care provider if: °· You keep feeling nauseous or you keep vomiting. °· You feel light-headed. °· You develop a rash. °· You have a fever. °Get help right away if: °· You have trouble breathing. °This information is not intended to replace advice given to you by your health care provider. Make sure you discuss any questions you have with your health care provider. °Document Released: 07/04/2013 Document Revised: 02/16/2016 Document Reviewed: 01/03/2016 °Elsevier Interactive Patient Education © 2019 Elsevier  Inc. ° °

## 2019-03-08 NOTE — Interval H&P Note (Signed)
History and Physical Interval Note:  03/08/2019 9:11 AM  Rick Logan  has presented today for surgery, with the diagnosis of PVD.  The various methods of treatment have been discussed with the patient and family. After consideration of risks, benefits and other options for treatment, the patient has consented to  Procedure(s): ABDOMINAL AORTOGRAM W/LOWER EXTREMITY (Bilateral) as a surgical intervention.  The patient's history has been reviewed, patient examined, no change in status, stable for surgery.  I have reviewed the patient's chart and labs.  Questions were answered to the patient's satisfaction.     Quay Burow

## 2019-03-13 ENCOUNTER — Telehealth: Payer: Self-pay | Admitting: Interventional Cardiology

## 2019-03-13 DIAGNOSIS — E7849 Other hyperlipidemia: Secondary | ICD-10-CM

## 2019-03-13 DIAGNOSIS — E781 Pure hyperglyceridemia: Secondary | ICD-10-CM

## 2019-03-13 NOTE — Telephone Encounter (Signed)
PT called very upset since he has a blockage in his leg and he has been doing some research and has read that high triglycerides are very dangerous. He has looked back and noticed that they had been high since 11/2017...he is now feeling very anxious about getting them managed. He says he has cut out sugar and has been watching his diet  But he also notes that when he had his last few lipid panels drawn he was not fasting. He says he was never told to be fasting and never given any counseling on how to improve his levels.   Pt has been referred to Dr. Debara Pickett for management but not until mid July 2020. He is asking if Dr. Debara Pickett could possibly order fasting labs prior to that appt since that appt is a tele visit so he will fell more comfortable knowing what his fasting results are. Pt says he has been on the Lipitor 40mg  without missing any doses for a very ling time not as noted on his labs 11/2017 that he has not been consistently taking. He says that information was not accurate.   Will forward to Dr. Josepha Pigg for review.

## 2019-03-13 NOTE — Telephone Encounter (Signed)
New Message   Patient is calling because he is aware that his triglycerides is high. He is disappointed because no one has taken action to help to control this and he has been told that its the cardiologist responsibility. Patient would like to further discuss. Please call.

## 2019-03-14 NOTE — Telephone Encounter (Signed)
Yes, please repeat fasting lipid profile and direct LDL just prior to the visit in July.  Dr Lemmie Evens

## 2019-03-15 NOTE — Telephone Encounter (Signed)
Labs ordered and patient notified to come to Dr. Lysbeth Penner office for FASTING labs about 1 week prior to LIPID clinic appt on 7/17.

## 2019-03-16 ENCOUNTER — Encounter: Payer: Self-pay | Admitting: Cardiology

## 2019-03-16 ENCOUNTER — Telehealth: Payer: Self-pay | Admitting: *Deleted

## 2019-03-16 NOTE — Telephone Encounter (Signed)
Virtual Visit Pre-Appointment Phone Call  "(Name), I am calling you today to discuss your upcoming appointment. We are currently trying to limit exposure to the virus that causes COVID-19 by seeing patients at home rather than in the office."  1. "What is the BEST phone number to call the day of the visit?" - include this in appointment notes  2. "Do you have or have access to (through a family member/friend) a smartphone with video capability that we can use for your visit?" a. If yes - list this number in appt notes as "cell" (if different from BEST phone #) and list the appointment type as a VIDEO visit in appointment notes b. If no - list the appointment type as a PHONE visit in appointment notes  3. Confirm consent - "In the setting of the current Covid19 crisis, you are scheduled for a (phone or video) visit with your provider on (date) at (time).  Just as we do with many in-office visits, in order for you to participate in this visit, we must obtain consent.  If you'd like, I can send this to your mychart (if signed up) or email for you to review.  Otherwise, I can obtain your verbal consent now.  All virtual visits are billed to your insurance company just like a normal visit would be.  By agreeing to a virtual visit, we'd like you to understand that the technology does not allow for your provider to perform an examination, and thus may limit your provider's ability to fully assess your condition. If your provider identifies any concerns that need to be evaluated in person, we will make arrangements to do so.  Finally, though the technology is pretty good, we cannot assure that it will always work on either your or our end, and in the setting of a video visit, we may have to convert it to a phone-only visit.  In either situation, we cannot ensure that we have a secure connection.  Are you willing to proceed?" STAFF: Did the patient verbally acknowledge consent to telehealth visit? Document  YES/NO here: YES  4. Advise patient to be prepared - "Two hours prior to your appointment, go ahead and check your blood pressure, pulse, oxygen saturation, and your weight (if you have the equipment to check those) and write them all down. When your visit starts, your provider will ask you for this information. If you have an Apple Watch or Kardia device, please plan to have heart rate information ready on the day of your appointment. Please have a pen and paper handy nearby the day of the visit as well."  5. Give patient instructions for MyChart download to smartphone OR Doximity/Doxy.me as below if video visit (depending on what platform provider is using)  6. Inform patient they will receive a phone call 15 minutes prior to their appointment time (may be from unknown caller ID) so they should be prepared to answer    TELEPHONE CALL NOTE  Rick Logan has been deemed a candidate for a follow-up tele-health visit to limit community exposure during the Covid-19 pandemic. I spoke with the patient via phone to ensure availability of phone/video source, confirm preferred email & phone number, and discuss instructions and expectations.  I reminded Rick Logan to be prepared with any vital sign and/or heart rhythm information that could potentially be obtained via home monitoring, at the time of his visit. I reminded Rick Logan to expect a phone call prior to  his visit.  Raelyn NumberWilliamson, Tyrann Donaho L, CMA 03/16/2019 5:03 PM   INSTRUCTIONS FOR DOWNLOADING THE MYCHART APP TO SMARTPHONE  - The patient must first make sure to have activated MyChart and know their login information - If Apple, go to [employer] Store and type in MyChart in the search bar and download the app. If Android, ask patient to go to [employer] Play Store and type in BaxterMyChart in the search bar and download the app. The app is free but as with any other app downloads, their phone may require them to verify saved payment  information or Apple/Android password.  - The patient will need to then log into the app with their MyChart username and password, and select [facility] as their healthcare provider to link the account. When it is time for your visit, go to the MyChart app, find appointments, and click Begin Video Visit. Be sure to Select Allow for your device to access the Microphone and Camera for your visit. You will then be connected, and your provider will be with you shortly.  **If they have any issues connecting, or need assistance please contact MyChart service desk [phone])**  **If using a computer, in order to ensure the best quality for their visit they will need to use either of the following Internet Browsers: [employer] Edge, or Google Chrome**  IF USING DOXIMITY or DOXY.ME - The patient will receive a link just prior to their visit by text.     FULL LENGTH CONSENT FOR TELE-HEALTH VISIT   I hereby voluntarily request, consent and authorize [facility] and its employed or contracted physicians, physician assistants, nurse practitioners or other licensed health care professionals (the Practitioner), to provide me with telemedicine health care services (the "Services") as deemed necessary by the treating Practitioner. I acknowledge and consent to receive the Services by the Practitioner via telemedicine. I understand that the telemedicine visit will involve communicating with the Practitioner through live audiovisual communication technology and the disclosure of certain medical information by electronic transmission. I acknowledge that I have been given the opportunity to request an in-person assessment or other available alternative prior to the telemedicine visit and am voluntarily participating in the telemedicine visit.  I understand that I have the right to withhold or withdraw my consent to the use of telemedicine in the course of my care at any time, without affecting my right  to future care or treatment, and that the Practitioner or I may terminate the telemedicine visit at any time. I understand that I have the right to inspect all information obtained and/or recorded in the course of the telemedicine visit and may receive copies of available information for a reasonable fee.  I understand that some of the potential risks of receiving the Services via telemedicine include:  [name]. Delay or interruption in medical evaluation due to technological equipment failure or disruption; . Information transmitted may not be sufficient (e.g. poor resolution of images) to allow for appropriate medical decision making by the Practitioner; and/or  . In rare instances, security protocols could fail, causing a breach of personal health information.  Furthermore, I acknowledge that it is my responsibility to provide information about my medical history, conditions and care that is complete and accurate to the best of my ability. I acknowledge that Practitioner's advice, recommendations, and/or decision may be based on factors not within their control, such as incomplete or inaccurate data provided by me or distortions of diagnostic images or specimens that may result from electronic transmissions.  I understand that the practice of medicine is not an exact science and that Practitioner makes no warranties or guarantees regarding treatment outcomes. I acknowledge that I will receive a copy of this consent concurrently upon execution via email to the email address I last provided but may also request a printed copy by calling the office of [place].    I understand that my insurance will be billed for this visit.   I have read or had this consent read to me. . I understand the contents of this consent, which adequately explains the benefits and risks of the Services being provided via telemedicine.  . I have been provided ample opportunity to ask questions regarding this consent and the Services  and have had my questions answered to my satisfaction. . I give my informed consent for the services to be provided through the use of telemedicine in my medical care  By participating in this telemedicine visit I agree to the above.    Spoke to patient and he agreed to have a video visit with Dr. Gwenlyn Found March 28, 2019 at 11:45 AM.

## 2019-03-16 NOTE — Progress Notes (Signed)
Remote ICD transmission.   

## 2019-03-20 ENCOUNTER — Telehealth: Payer: Self-pay

## 2019-03-20 NOTE — Telephone Encounter (Signed)
Called pt and changed appointment back to office visit and Dr. Gwenlyn Found will be in office that date. Pt made aware.

## 2019-03-22 ENCOUNTER — Inpatient Hospital Stay (HOSPITAL_COMMUNITY): Admit: 2019-03-22 | Payer: Medicare Other

## 2019-03-23 DIAGNOSIS — E7849 Other hyperlipidemia: Secondary | ICD-10-CM | POA: Diagnosis not present

## 2019-03-23 DIAGNOSIS — E781 Pure hyperglyceridemia: Secondary | ICD-10-CM | POA: Diagnosis not present

## 2019-03-23 LAB — LIPID PANEL
Chol/HDL Ratio: 4.5 ratio (ref 0.0–5.0)
Cholesterol, Total: 161 mg/dL (ref 100–199)
HDL: 36 mg/dL — ABNORMAL LOW (ref >39)
LDL Calculated: 64 mg/dL (ref 0–99)
Triglycerides: 307 mg/dL — ABNORMAL HIGH (ref 0–149)
VLDL Cholesterol Cal: 61 mg/dL — ABNORMAL HIGH (ref 5–40)

## 2019-03-23 LAB — LDL CHOLESTEROL, DIRECT: LDL Direct: 84 mg/dL (ref 0–99)

## 2019-03-26 ENCOUNTER — Encounter: Payer: Self-pay | Admitting: Internal Medicine

## 2019-03-27 ENCOUNTER — Telehealth: Payer: Self-pay | Admitting: Cardiovascular Disease

## 2019-03-27 NOTE — Telephone Encounter (Signed)
° ° °  COVID-19 Pre-Screening Questions:   In the past 7 to 10 days have you had a cough,  shortness of breath, headache, congestion, fever (100 or greater) body aches, chills, sore throat, or sudden loss of taste or sense of smell? no  Have you been around anyone with known Covid 19. no  Have you been around anyone who is awaiting Covid 19 test results in the past 7 to 10 days? no  Have you been around anyone who has been exposed to Covid 19, or has mentioned symptoms of Covid 19 within the past 7 to 10 days? no  If you have any concerns/questions about symptoms patients report during screening (either on the phone or at threshold). Contact the provider seeing the patient or DOD for further guidance.  If neither are available contact a member of the leadership team.  I called to confirm his appt with Dr Gwenlyn Found on 03-28-19.  Pt says wife will probably insist on coming in with him for apt. I explained that because of COVID-19, that we were just trying to keep it to the patient only, unless disabled or had to have help.

## 2019-03-28 ENCOUNTER — Ambulatory Visit (INDEPENDENT_AMBULATORY_CARE_PROVIDER_SITE_OTHER): Payer: Medicare Other | Admitting: Cardiovascular Disease

## 2019-03-28 ENCOUNTER — Other Ambulatory Visit: Payer: Self-pay

## 2019-03-28 ENCOUNTER — Encounter: Payer: Self-pay | Admitting: Cardiovascular Disease

## 2019-03-28 DIAGNOSIS — I739 Peripheral vascular disease, unspecified: Secondary | ICD-10-CM

## 2019-03-28 DIAGNOSIS — I6523 Occlusion and stenosis of bilateral carotid arteries: Secondary | ICD-10-CM

## 2019-03-28 MED ORDER — CILOSTAZOL 50 MG PO TABS: 50.0000 mg | ORAL_TABLET | Freq: Two times a day (BID) | ORAL | 3 refills | Status: DC

## 2019-03-28 NOTE — Assessment & Plan Note (Signed)
History of peripheral arterial disease status post left SFA directional atherectomy followed by drug-coated balloon angioplasty by myself 08/28/2015.  Because of recurrent claudication, he had Doppler studies performed 02/26/2019 that showed a decline in his left ABI from normal down 2.79.  Peripheral angiography performed by myself 03/08/2019 revealed an occluded left SFA from near the origin down to the adductor canal with three-vessel runoff.  I do not think he was a candidate for percutaneous intervention.  He continues to have lifestyle limiting claudication.  I am going to start him on low-dose Pletal and will see him back in 3 months for follow-up.  Given the fact that he does not have critical limb ischemia I do not think he is necessarily a candidate for femoropopliteal bypass grafting.

## 2019-03-28 NOTE — Patient Instructions (Addendum)
Medication Instructions:  Your physician has recommended you make the following change in your medication:   START CILOSTAZOL (PLETAL) 50 MG, ONE TABLET BY MOUTH TWICE A DAY   If you need a refill on your cardiac medications before your next appointment, please call your pharmacy.   Lab work: NONE If you have labs (blood work) drawn today and your tests are completely normal, you will receive your results only by: . MyChart Message (if you have MyChart) OR . A paper copy in the mail If you have any lab test that is abnormal or we need to change your treatment, we will call you to review the results.  Testing/Procedures: NONE  Follow-Up: At [facility], you and your health needs are our priority.  As part of our continuing mission to provide you with exceptional heart care, we have created designated Provider Care Teams.  These Care Teams include your primary Cardiologist (physician) and Advanced Practice Providers (APPs -  Physician Assistants and Nurse Practitioners) who all work together to provide you with the care you need, when you need it. You will need a follow up appointment in 3 months WITH DR. BERRY.  Please call our office 2 months in advance to schedule this appointment.    

## 2019-03-28 NOTE — Progress Notes (Signed)
03/28/2019 Rick Logan   1956/08/18  [id number]  Primary Physician [place] [place] [facility] Primary Cardiologist: Lorretta Harp MD Garret Reddish, [place], [place]  HPI:  Rick Logan is a 63 y.o.  father of 3 children, grandfather and 8 grandchildren referred by Dr. Tollie Pizza for peripheral vascular evaluation because of symptomatic claudication. 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 His cardiovascular risk factor profile is notable for 40-pack-years of tobacco abuse currently smoking one pack per day. There is no family history of heart disease. He has never had a heart attack or stroke. He denies chest pain or shortness of breath. He's noticed claudication for the last 3 months in his left calf particularly notable when walking up an incline. He was noted by his primary care physician to have an absent left pedal pulse and was referred here for further evaluation. His Dopplers showed a high-frequency signal in his proximal left SFA with a decreased ABI. He underwent peripheral angiography and intervention by myself 08/28/15 with directional atherectomy of his proximal and mid left SFA. He had 3 vessel runoff on that side. His follow-up Dopplers performed today revealed a widely patent SFA with a left ABI of 1. His claudication has resolved.  He had an episode of sudden cardiac death 07/01/17 from ventricular fibrillation underwent cardiac catheterization by [place] revealing minimal CAD slightly reduced LV function which ultimately normalized several months later.  He subsequently received an ICD by Dr. Caryl Comes who follows that.  He denies chest pain or shortness of breath.  His major complaint is of left calf claudication which began around that time.  He did stop smoking on the day of his sudden cardiac death episode.  Recent Doppler studies performed 02/26/2019 revealed a right ABI ABI that was normal and a  left ABI of 0.79 with an occluded left SFA.  I performed peripheral angiography on him 03/08/2019 revealing a totally occluded left SFA from near the origin down to the adductor canal with three-vessel runoff.  I do not think he was a candidate for percutaneous intervention given the length and complexity of his anatomy.    Current Meds  Medication Sig  . acetaminophen (TYLENOL 8 HOUR ARTHRITIS PAIN) 650 MG CR tablet Take 650 mg by mouth 2 (two) times daily as needed (pain.).   [name] aspirin EC 81 MG tablet Take 81 mg by mouth daily.  [name] atorvastatin (LIPITOR) 40 MG tablet Take 1 tablet (40 mg total) by mouth daily.  . benazepril (LOTENSIN) 40 MG tablet Take 0.5 tablets (20 mg total) by mouth at bedtime. (Patient taking differently: Take 20 mg by mouth daily. )  . carvedilol (COREG) 3.125 MG tablet TAKE 1 TABLET BY MOUTH TWICE A DAY WITH A MEAL (Patient taking differently: Take 3.125 mg by mouth 2 (two) times a day. )  . famotidine (PEPCID) 20 MG tablet Take 20 mg by mouth every evening.   [name] omeprazole (PRILOSEC) 40 MG capsule Take 40 mg by mouth 2 (two) times a day.     No Known Allergies  Social History   Socioeconomic History  . Marital status: Married    Spouse name: Not on file  . Number of children: Not on file  . Years of education: Not on file  . Highest education level: Not on file  Occupational History  . Not on file  Social Needs  . Financial resource strain: Not on  file  . Food insecurity    Worry: Not on file    Inability: Not on file  . Transportation needs    Medical: Not on file    Non-medical: Not on file  Tobacco Use  . Smoking status: Former Smoker    Packs/day: 1.00    Years: 41.00    Pack years: 41.00    Types: Cigarettes    Quit date: 06/01/2017    Years since quitting: 1.8  . Smokeless tobacco: Never Used  Substance and Sexual Activity  . Alcohol use: No  . Drug use: No  . Sexual activity: Not Currently  Lifestyle  . Physical activity    Days per  week: Not on file    Minutes per session: Not on file  . Stress: Not on file  Relationships  . Social Musician on phone: Not on file    Gets together: Not on file    Attends religious service: Not on file    Active member of club or organization: Not on file    Attends meetings of clubs or organizations: Not on file    Relationship status: Not on file  . Intimate partner violence    Fear of current or ex partner: Not on file    Emotionally abused: Not on file    Physically abused: Not on file    Forced sexual activity: Not on file  Other Topics Concern  . Not on file  Social History Narrative   ** Merged History Encounter **         Review of Systems: General: negative for chills, fever, night sweats or weight changes.  Cardiovascular: negative for chest pain, dyspnea on exertion, edema, orthopnea, palpitations, paroxysmal nocturnal dyspnea or shortness of breath Dermatological: negative for rash Respiratory: negative for cough or wheezing Urologic: negative for hematuria Abdominal: negative for nausea, vomiting, diarrhea, bright red blood per rectum, melena, or hematemesis Neurologic: negative for visual changes, syncope, or dizziness All other systems reviewed and are otherwise negative except as noted above.    Blood pressure 128/78, pulse (!) 57, temperature (!) 97.2 F (36.2 C), height 5' 11.5" (1.816 m), weight 208 lb (94.3 kg).  General appearance: alert and no distress Neck: no adenopathy, no carotid bruit, no JVD, supple, symmetrical, trachea midline and thyroid not enlarged, symmetric, no tenderness/mass/nodules Lungs: clear to auscultation bilaterally Heart: regular rate and rhythm, S1, S2 normal, no murmur, click, rub or gallop Extremities: extremities normal, atraumatic, no cyanosis or edema Pulses: Decreased left pedal pulse Skin: Skin color, texture, turgor normal. No rashes or lesions Neurologic: Alert and oriented X 3, normal strength and tone.  Normal symmetric reflexes. Normal coordination and gait  EKG not performed today  ASSESSMENT AND PLAN:   PVD (peripheral vascular disease) (HCC) History of peripheral arterial disease status post left SFA directional atherectomy followed by drug-coated balloon angioplasty by myself 08/28/2015.  Because of recurrent claudication, he had Doppler studies performed 02/26/2019 that showed a decline in his left ABI from normal down 2.79.  Peripheral angiography performed by myself 03/08/2019 revealed an occluded left SFA from near the origin down to the adductor canal with three-vessel runoff.  I do not think he was a candidate for percutaneous intervention.  He continues to have lifestyle limiting claudication.  I am going to start him on low-dose Pletal and will see him back in 3 months for follow-up.  Given the fact that he does not have critical limb ischemia I do not  think he is necessarily a candidate for femoropopliteal bypass grafting.      Runell GessJonathan J. Moon Budde MD FACP,FACC,FAHA, [facility] 03/28/2019 1:04 PM

## 2019-04-12 ENCOUNTER — Telehealth: Payer: Self-pay | Admitting: Internal Medicine

## 2019-04-12 NOTE — Telephone Encounter (Signed)
I called Rick Logan to confirm appt  His appt with Dr Debara Pickett on 04-13-19.       1. Confirm consent - "In the setting of the current Covid19 crisis, you are scheduled for a (phone or video) visit with your provider on (date) at (time).  Just as we do with many in-office visits, in order for you to participate in this visit, we must obtain consent.  If you'd like, I can send this to your mychart (if signed up) or email for you to review.  Otherwise, I can obtain your verbal consent now.  All virtual visits are billed to your insurance company just like a normal visit would be.  By agreeing to a virtual visit, we'd like you to understand that the technology does not allow for your provider to perform an examination, and thus may limit your provider's ability to fully assess your condition. If your provider identifies any concerns that need to be evaluated in person, we will make arrangements to do so.  Finally, though the technology is pretty good, we cannot assure that it will always work on either your or our end, and in the setting of a video visit, we may have to convert it to a phone-only visit.  In either situation, we cannot ensure that we have a secure connection.  Are you willing to proceed?" STAFF: Did the patient verbally acknowledge consent to telehealth visit? Document YES/NO here: Yes       FULL LENGTH CONSENT FOR TELE-HEALTH VISIT   I hereby voluntarily request, consent and authorize [facility] and its employed or contracted physicians, physician assistants, nurse practitioners or other licensed health care professionals (the Practitioner), to provide me with telemedicine health care services (the Services") as deemed necessary by the treating Practitioner. I acknowledge and consent to receive the Services by the Practitioner via telemedicine. I understand that the telemedicine visit will involve communicating with the Practitioner through live audiovisual communication technology and the  disclosure of certain medical information by electronic transmission. I acknowledge that I have been given the opportunity to request an in-person assessment or other available alternative prior to the telemedicine visit and am voluntarily participating in the telemedicine visit.  I understand that I have the right to withhold or withdraw my consent to the use of telemedicine in the course of my care at any time, without affecting my right to future care or treatment, and that the Practitioner or I may terminate the telemedicine visit at any time. I understand that I have the right to inspect all information obtained and/or recorded in the course of the telemedicine visit and may receive copies of available information for a reasonable fee.  I understand that some of the potential risks of receiving the Services via telemedicine include:   Delay or interruption in medical evaluation due to technological equipment failure or disruption;  Information transmitted may not be sufficient (e.g. poor resolution of images) to allow for appropriate medical decision making by the Practitioner; and/or   In rare instances, security protocols could fail, causing a breach of personal health information.  Furthermore, I acknowledge that it is my responsibility to provide information about my medical history, conditions and care that is complete and accurate to the best of my ability. I acknowledge that Practitioner's advice, recommendations, and/or decision may be based on factors not within their control, such as incomplete or inaccurate data provided by me or distortions of diagnostic images or specimens that may result from electronic transmissions. I understand  that the practice of medicine is not an exact science and that Practitioner makes no warranties or guarantees regarding treatment outcomes. I acknowledge that I will receive a copy of this consent concurrently upon execution via email to the email address I  last provided but may also request a printed copy by calling the office of [facility].    I understand that my insurance will be billed for this visit.   I have read or had this consent read to me.  I understand the contents of this consent, which adequately explains the benefits and risks of the Services being provided via telemedicine.   I have been provided ample opportunity to ask questions regarding this consent and the Services and have had my questions answered to my satisfaction.  I give my informed consent for the services to be provided through the use of telemedicine in my medical care  By participating in this telemedicine visit I agree to the above.

## 2019-04-13 ENCOUNTER — Telehealth (INDEPENDENT_AMBULATORY_CARE_PROVIDER_SITE_OTHER): Payer: Medicare Other | Admitting: Internal Medicine

## 2019-04-13 ENCOUNTER — Encounter: Payer: Self-pay | Admitting: Internal Medicine

## 2019-04-13 ENCOUNTER — Telehealth: Payer: Self-pay | Admitting: Internal Medicine

## 2019-04-13 VITALS — Ht 72.0 in

## 2019-04-13 DIAGNOSIS — I2510 Atherosclerotic heart disease of native coronary artery without angina pectoris: Secondary | ICD-10-CM

## 2019-04-13 DIAGNOSIS — I739 Peripheral vascular disease, unspecified: Secondary | ICD-10-CM | POA: Diagnosis not present

## 2019-04-13 DIAGNOSIS — E782 Mixed hyperlipidemia: Secondary | ICD-10-CM

## 2019-04-13 DIAGNOSIS — I5022 Chronic systolic (congestive) heart failure: Secondary | ICD-10-CM

## 2019-04-13 DIAGNOSIS — I469 Cardiac arrest, cause unspecified: Secondary | ICD-10-CM

## 2019-04-13 MED ORDER — ICOSAPENT ETHYL 1 G PO CAPS: 2.0000 g | ORAL_CAPSULE | Freq: Two times a day (BID) | ORAL | 11 refills | Status: DC

## 2019-04-13 NOTE — Telephone Encounter (Signed)
Patient called to review e-visit instructions. Patient aware that the following changes have been made: start vascepa 2gm BID Patient aware that they will need the following labs: fasting labs (lipid panel, direct LDL in 3-4 months Patient aware that they will need the following test(s): nonr  Recall for 3-4 month LIPID CLINIC entered.   No further assistance needed at this time.

## 2019-04-13 NOTE — Progress Notes (Signed)
Virtual Visit via Telephone Note   This visit type was conducted due to national recommendations for restrictions regarding the COVID-19 Pandemic (e.g. social distancing) in an effort to limit this patient's exposure and mitigate transmission in our community.  Due to his co-morbid illnesses, this patient is at least at moderate risk for complications without adequate follow up.  This format is felt to be most appropriate for this patient at this time.  The patient did not have access to video technology/had technical difficulties with video requiring transitioning to audio format only (telephone).  All issues noted in this document were discussed and addressed.  No physical exam could be performed with this format.  Please refer to the patient's chart for his  consent to telehealth for [facility].   Evaluation Performed:  Telephone visit  Date:  04/13/2019   ID:  Rick Logan, DOB 06/13/1956, MRN [id number]  Patient Location:  [address] [place] [address]  Provider location:   [address] [place], [place] [address]  PCP:  [place], [facility]  Cardiologist:  Lesleigh NoeHenry W Smith III, MD Electrophysiologist:  Sherryl MangesSteven Klein, MD   Chief Complaint:  Manage dyslipidemia  History of Present Illness:    Rick FinnerRonald R Logan is a 63 y.o. male who presents via audio/video conferencing for a telehealth visit today.  Mr. Graciela HusbandsKlein with is kindly referred for management of dyslipidemia.  He is a patient of Dr. Katrinka BlazingSmith, who unfortunately suffered a cardiac arrest for ventricular fibrillation.  He also has a history of hypertension, dyslipidemia and tobacco abuse on and off.  He recently quit again.  He has known bilateral carotid artery disease and is status post AICD placement.  Recently was found to have claudication and was found to have proximal left SFA disease with history of atherectomy of the proximal and mid left SFA.  Repeat imaging in March 08, 2019  revealed totally occluded SFA and it was felt not to be a candidate for percutaneous intervention.  Medical therapy was recommended and referral to lipid clinic was initiated.  His labs 2 months ago showed a total cholesterol 176, triglycerides 740, HDL 29.  He was on atorvastatin 40 mg daily.  Since then he had made significant dietary changes reducing sweets, carbohydrates and other changes including smoking cessation again.  Repeat labs 3 weeks ago showed total cholesterol 161, triglycerides 307, HDL 36 and LDL of 64 (goal LDL less than 70).  While this is a much improved and favorable lipid profile, demonstrating dietary impact on triglycerides, he has persistently elevated triglycerides.  His best triglyceride numbers were a year ago at 151 which is essentially normal therefore diet is likely playing a big impact in this.  Despite this, however, there is persistent risk with elevated triglycerides as seen in the REDUCE-IT trial.   The patient does not have symptoms concerning for COVID-19 infection (fever, chills, cough, or new SHORTNESS OF BREATH).    Prior CV studies:   The following studies were reviewed today:  Chart review Lab work  PMHx:  Past Medical History:  Diagnosis Date  . Arthritis    "joints get sore at times" (08/28/2015)  . Cardiac arrest (HCC)    Vfib   . Claudication of calf muscles (HCC)    left lower extremity  . Essential hypertension   . Hyperlipidemia   . Hypertension   . PVD (peripheral vascular disease) (HCC)   . Smoking   . Systolic heart failure (  HCC)    EF 30-35%  . Tobacco abuse     Past Surgical History:  Procedure Laterality Date  . ABDOMINAL AORTOGRAM W/LOWER EXTREMITY Bilateral 03/08/2019   Procedure: ABDOMINAL AORTOGRAM W/LOWER EXTREMITY;  Surgeon: Runell Gess, MD;  [facility] INVASIVE CV LAB;  Service: Cardiovascular;  Laterality: Bilateral;  . ABDOMINAL SURGERY     No details available  . APPENDECTOMY    . COLON SURGERY    .  EXPLORATORY LAPAROTOMY  1970's X 2   "part of small intestines; part of my bowels; appendix; 2nd OR was for adhesions"  . ICD IMPLANT N/A 06/06/2017   Procedure: ICD Implant;  Surgeon: Duke Salvia, MD;  Location: [facility] INVASIVE CV LAB;  Service: Cardiovascular;  Laterality: N/A;  . LEFT HEART CATH AND CORONARY ANGIOGRAPHY N/A 06/03/2017   Procedure: LEFT HEART CATH AND CORONARY ANGIOGRAPHY;  Surgeon: Swaz[place], Peter M, MD;  Location: [facility] INVASIVE CV LAB;  Service: Cardiovascular;  Laterality: N/A;  . PERIPHERAL VASCULAR CATHETERIZATION N/A 08/28/2015   Procedure: Lower Extremity Angiography;  Surgeon: Runell Gess, MD;  Location: [facility] INVASIVE CV LAB;  Service: Cardiovascular;  Laterality: N/A;  . TONSILLECTOMY      FAMHx:  Family History  Problem Relation Age of Onset  . Cancer Father     SOCHx:   reports that he quit smoking about 22 months ago. His smoking use included cigarettes. He has a 41.00 pack-year smoking history. He has never used smokeless tobacco. He reports that he does not drink alcohol or use drugs.  ALLERGIES:  No Known Allergies  MEDS:  Current Meds  Medication Sig  . acetaminophen (TYLENOL 8 HOUR ARTHRITIS PAIN) 650 MG CR tablet Take 650 mg by mouth 2 (two) times daily as needed (pain.).   [name] aspirin EC 81 MG tablet Take 81 mg by mouth daily.  [name] atorvastatin (LIPITOR) 40 MG tablet Take 1 tablet (40 mg total) by mouth daily.  . benazepril (LOTENSIN) 40 MG tablet Take 0.5 tablets (20 mg total) by mouth at bedtime. (Patient taking differently: Take 20 mg by mouth daily. )  . carvedilol (COREG) 3.125 MG tablet TAKE 1 TABLET BY MOUTH TWICE A DAY WITH A MEAL (Patient taking differently: Take 3.125 mg by mouth 2 (two) times a day. )  . cilostazol (PLETAL) 50 MG tablet Take 1 tablet (50 mg total) by mouth 2 (two) times daily.  . famotidine (PEPCID) 20 MG tablet Take 20 mg by mouth every evening.   [name] omeprazole (PRILOSEC) 40 MG capsule Take 40 mg by mouth 2 (two) times a day.      ROS: Pertinent items noted in HPI and remainder of comprehensive ROS otherwise negative.  Labs/Other Tests and Data Reviewed:    Recent Labs: 02/12/2019: ALT 52 03/02/2019: BUN 14; Creatinine, Ser 1.10; Hemoglobin 14.4; Platelets 247; Potassium 4.6; Sodium 138   Recent Lipid Panel Lab Results  Component Value Date/Time   CHOL 161 03/23/2019 09:35 AM   TRIG 307 (H) 03/23/2019 09:35 AM   HDL 36 (L) 03/23/2019 09:35 AM   CHOLHDL 4.5 03/23/2019 09:35 AM   LDLCALC 64 03/23/2019 09:35 AM   LDLDIRECT 84 03/23/2019 09:35 AM    Wt Readings from Last 3 Encounters:  03/28/19 208 lb (94.3 kg)  03/08/19 208 lb (94.3 kg)  03/02/19 208 lb (94.3 kg)     Exam:    Vital Signs:  Ht 6' (1.829 m)   BMI 28.21 kg/m    Exam not performed due to telephone  visit  ASSESSMENT & PLAN:    1. Mixed hyperlipidemia, goal LDL less than 70 2. History of aborted sudden cardiac death-VF arrest status post AICD 3. Mild nonobstructive coronary disease 4. Peripheral artery disease with occluded left SFA  Mr. Tanzi a mixed dyslipidemia with goal LDL less than 70.  He is currently at goal on a statin however has elevated triglycerides.  This is improved significantly with dietary changes however presents persistent risk, in fact he has a 30% chance of MI or stroke in the next 5 years.  This risk could be further mitigated by the addition of Vascepa based on the REDUCE-IT trial.  I would recommend starting Vascepa 2 g twice daily.  Plan repeat lipids in about 3 months.  He should continue current dose atorvastatin.  Thanks again for the kind referral.  COVID-19 Education: The signs and symptoms of COVID-19 were discussed with the patient and how to seek care for testing (follow up with PCP or arrange E-visit).  The importance of social distancing was discussed today.  Patient Risk:   After full review of this patients clinical status, I feel that they are at least moderate risk at this time.  Time:    Today, I have spent 25 minutes with the patient with telehealth technology discussing CAD, PAD, lipid management, triglyceride management, dietary recommendations.     Medication Adjustments/Labs and Tests Ordered: Current medicines are reviewed at length with the patient today.  Concerns regarding medicines are outlined above.   Tests Ordered: No orders of the defined types were placed in this encounter.   Medication Changes: No orders of the defined types were placed in this encounter.   Disposition:  in 3 month(s)  Pixie Casino, MD, [facility], [place] Director of the Advanced Lipid Disorders &  Cardiovascular Risk Reduction Clinic Diplomate of the American Board of Clinical Lipidology Attending Cardiologist  Direct Dial: [phone]  Fax: [phone]  Website:  [email or web address]  Pixie Casino, MD  04/13/2019 9:44 AM

## 2019-04-13 NOTE — Patient Instructions (Signed)
Medication Instructions:  START vascepa 2 grams twice daily - 2 capsules two times a day If you need a refill on your cardiac medications before your next appointment, please call your pharmacy.   Lab work: FASTING lab work in about 3-4 months If you have labs (blood work) drawn today and your tests are completely normal, you will receive your results only by: [name] MyChart Message (if you have MyChart) OR . A paper copy in the mail If you have any lab test that is abnormal or we need to change your treatment, we will call you to review the results.  Testing/Procedures: NONE  Follow-Up: Dr. Debara Pickett recommends that you schedule a follow up visit with him the in the [place] in 3-4 months. Please have fasting blood work about 1 week prior to this visit and he will review the blood work results with you at your appointment.

## 2019-04-20 ENCOUNTER — Encounter (HOSPITAL_COMMUNITY): Payer: Self-pay | Admitting: Cardiology

## 2019-04-25 DIAGNOSIS — I739 Peripheral vascular disease, unspecified: Secondary | ICD-10-CM | POA: Diagnosis not present

## 2019-04-25 DIAGNOSIS — E782 Mixed hyperlipidemia: Secondary | ICD-10-CM | POA: Diagnosis not present

## 2019-04-26 LAB — LIPID PANEL
Chol/HDL Ratio: 4.2 ratio (ref 0.0–5.0)
Cholesterol, Total: 151 mg/dL (ref 100–199)
HDL: 36 mg/dL — ABNORMAL LOW (ref >39)
LDL Calculated: 75 mg/dL (ref 0–99)
Triglycerides: 199 mg/dL — ABNORMAL HIGH (ref 0–149)
VLDL Cholesterol Cal: 40 mg/dL (ref 5–40)

## 2019-04-26 LAB — LDL CHOLESTEROL, DIRECT: LDL Direct: 75 mg/dL (ref 0–99)

## 2019-04-30 ENCOUNTER — Encounter: Payer: Self-pay | Admitting: Internal Medicine

## 2019-05-11 ENCOUNTER — Ambulatory Visit (HOSPITAL_COMMUNITY): Payer: Medicare Other | Attending: Cardiology

## 2019-05-11 ENCOUNTER — Telehealth: Payer: Self-pay | Admitting: Interventional Cardiology

## 2019-05-11 ENCOUNTER — Other Ambulatory Visit: Payer: Self-pay

## 2019-05-11 DIAGNOSIS — R0602 Shortness of breath: Secondary | ICD-10-CM | POA: Diagnosis not present

## 2019-05-11 NOTE — Telephone Encounter (Signed)
New message   Patient is returning call for echo results. Please call. 

## 2019-05-11 NOTE — Telephone Encounter (Signed)
Pt has been notified of echo results. See Results notes for notes.

## 2019-06-04 DIAGNOSIS — Z23 Encounter for immunization: Secondary | ICD-10-CM | POA: Diagnosis not present

## 2019-06-06 ENCOUNTER — Ambulatory Visit (INDEPENDENT_AMBULATORY_CARE_PROVIDER_SITE_OTHER): Payer: Medicare Other | Admitting: *Deleted

## 2019-06-06 DIAGNOSIS — I5022 Chronic systolic (congestive) heart failure: Secondary | ICD-10-CM | POA: Diagnosis not present

## 2019-06-06 DIAGNOSIS — I4901 Ventricular fibrillation: Secondary | ICD-10-CM

## 2019-06-06 LAB — CUP PACEART REMOTE DEVICE CHECK
Battery Remaining Longevity: 125 mo
Battery Voltage: 3.01 V
Brady Statistic RV Percent Paced: 0.01 %
Date Time Interrogation Session: 20200909082207
HighPow Impedance: 88 Ohm
Implantable Lead Implant Date: 20180910
Implantable Lead Location: 753860
Implantable Pulse Generator Implant Date: 20180910
Lead Channel Impedance Value: 532 Ohm
Lead Channel Impedance Value: 551 Ohm
Lead Channel Pacing Threshold Amplitude: 0.5 V
Lead Channel Pacing Threshold Pulse Width: 0.4 ms
Lead Channel Sensing Intrinsic Amplitude: 5.5 mV
Lead Channel Sensing Intrinsic Amplitude: 5.5 mV
Lead Channel Setting Pacing Amplitude: 2.5 V
Lead Channel Setting Pacing Pulse Width: 0.4 ms
Lead Channel Setting Sensing Sensitivity: 0.3 mV

## 2019-06-15 ENCOUNTER — Other Ambulatory Visit: Payer: Self-pay | Admitting: Internal Medicine

## 2019-06-15 DIAGNOSIS — E781 Pure hyperglyceridemia: Secondary | ICD-10-CM

## 2019-06-15 DIAGNOSIS — E782 Mixed hyperlipidemia: Secondary | ICD-10-CM

## 2019-06-21 NOTE — Progress Notes (Signed)
Remote ICD transmission.   

## 2019-06-22 DIAGNOSIS — E782 Mixed hyperlipidemia: Secondary | ICD-10-CM | POA: Diagnosis not present

## 2019-06-22 DIAGNOSIS — E781 Pure hyperglyceridemia: Secondary | ICD-10-CM | POA: Diagnosis not present

## 2019-06-22 LAB — LIPID PANEL
Chol/HDL Ratio: 4.8 ratio (ref 0.0–5.0)
Cholesterol, Total: 181 mg/dL (ref 100–199)
HDL: 38 mg/dL — ABNORMAL LOW (ref >39)
LDL Chol Calc (NIH): 85 mg/dL (ref 0–99)
Triglycerides: 352 mg/dL — ABNORMAL HIGH (ref 0–149)
VLDL Cholesterol Cal: 58 mg/dL — ABNORMAL HIGH (ref 5–40)

## 2019-06-22 LAB — LDL CHOLESTEROL, DIRECT: LDL Direct: 99 mg/dL (ref 0–99)

## 2019-06-29 ENCOUNTER — Ambulatory Visit: Payer: Medicare Other | Admitting: Cardiovascular Disease

## 2019-07-12 ENCOUNTER — Encounter: Payer: Self-pay | Admitting: Cardiovascular Disease

## 2019-07-12 ENCOUNTER — Ambulatory Visit (INDEPENDENT_AMBULATORY_CARE_PROVIDER_SITE_OTHER): Payer: Medicare Other | Admitting: Cardiovascular Disease

## 2019-07-12 ENCOUNTER — Other Ambulatory Visit: Payer: Self-pay

## 2019-07-12 VITALS — BP 116/76 | HR 74 | Temp 97.0°F | Ht 72.0 in | Wt 209.0 lb

## 2019-07-12 DIAGNOSIS — I6523 Occlusion and stenosis of bilateral carotid arteries: Secondary | ICD-10-CM

## 2019-07-12 DIAGNOSIS — E782 Mixed hyperlipidemia: Secondary | ICD-10-CM | POA: Diagnosis not present

## 2019-07-12 DIAGNOSIS — I739 Peripheral vascular disease, unspecified: Secondary | ICD-10-CM | POA: Diagnosis not present

## 2019-07-12 MED ORDER — ATORVASTATIN CALCIUM 80 MG PO TABS: 80.0000 mg | ORAL_TABLET | Freq: Every day | ORAL | 3 refills | Status: DC

## 2019-07-12 MED ORDER — COENZYME Q10 200 MG PO CAPS: 200.0000 mg | ORAL_CAPSULE | Freq: Every day | ORAL | 3 refills | Status: AC

## 2019-07-12 NOTE — Patient Instructions (Addendum)
Medication Instructions:  Your physician has recommended you make the following change in your medication:  STOP TAKING YOUR CILOSTAZOL (PLETAL).  INCREASE YOUR ATORVASTATIN (LIPITOR) TO 80 MG BY MOUTH DAILY  START COENZYME Q 10 200 MG BY MOUTH DAILY  If you need a refill on your cardiac medications before your next appointment, please call your pharmacy.   Lab work: Your physician recommends that you return for lab work in: 2 MONTHS (LIPID AND LIVER PANELS)  If you have labs (blood work) drawn today and your tests are completely normal, you will receive your results only by: [name] MyChart Message (if you have MyChart) OR . A paper copy in the mail If you have any lab test that is abnormal or we need to change your treatment, we will call you to review the results.  Testing/Procedures: NONE  Follow-Up: At [facility], you and your health needs are our priority.  As part of our continuing mission to provide you with exceptional heart care, we have created designated Provider Care Teams.  These Care Teams include your primary Cardiologist (physician) and Advanced Practice Providers (APPs -  Physician Assistants and Nurse Practitioners) who all work together to provide you with the care you need, when you need it. . You MAY SCHEDULE a follow up appointment AS NEEDED.You may see Dr. Allyson Sabal or one of the following Advanced Practice Providers on your designated Care Team:   . Corine Shelter, PA-C . Dot Lanes Kroeger, PA-C . Marjie Skiff, PA-C   Any Other Special Instructions Will Be Listed Below (If Applicable). PLEASE SCHEDULE A FOLLOW UP APPOINTMENT WITH DR. Hurman Horn FOR ICD CHECK        Heart-Healthy Eating Plan Heart-healthy meal planning includes:  Eating less unhealthy fats.  Eating more healthy fats.  Making other changes in your diet. Talk with your doctor or a diet specialist (dietitian) to create an eating plan that is right for you. What is my plan? Your doctor  may recommend an eating plan that includes:  Total fat: ______% or less of total calories a day.  Saturated fat: ______% or less of total calories a day.  Cholesterol: less than _________mg a day. What are tips for following this plan? Cooking Avoid frying your food. Try to bake, boil, grill, or broil it instead. You can also reduce fat by:  Removing the skin from poultry.  Removing all visible fats from meats.  Steaming vegetables in water or broth. Meal planning   At meals, divide your plate into four equal parts: ? Fill one-half of your plate with vegetables and green salads. ? Fill one-fourth of your plate with whole grains. ? Fill one-fourth of your plate with lean protein foods.  Eat 4-5 servings of vegetables per day. A serving of vegetables is: ? 1 cup of raw or cooked vegetables. ? 2 cups of raw leafy greens.  Eat 4-5 servings of fruit per day. A serving of fruit is: ? 1 medium whole fruit. ?  cup of dried fruit. ?  cup of fresh, frozen, or canned fruit. ?  cup of 100% fruit juice.  Eat more foods that have soluble fiber. These are apples, broccoli, carrots, beans, peas, and barley. Try to get 20-30 g of fiber per day.  Eat 4-5 servings of nuts, legumes, and seeds per week: ? 1 serving of dried beans or legumes equals  cup after being cooked. ? 1 serving of nuts is  cup. ? 1 serving of seeds equals 1 tablespoon. General  information  Eat more home-cooked food. Eat less restaurant, buffet, and fast food.  Limit or avoid alcohol.  Limit foods that are high in starch and sugar.  Avoid fried foods.  Lose weight if you are overweight.  Keep track of how much salt (sodium) you eat. This is important if you have high blood pressure. Ask your doctor to tell you more about this.  Try to add vegetarian meals each week. Fats  Choose healthy fats. These include olive oil and canola oil, flaxseeds, walnuts, almonds, and seeds.  Eat more omega-3 fats. These  include salmon, mackerel, sardines, tuna, flaxseed oil, and ground flaxseeds. Try to eat fish at least 2 times each week.  Check food labels. Avoid foods with trans fats or high amounts of saturated fat.  Limit saturated fats. ? These are often found in animal products, such as meats, butter, and cream. ? These are also found in plant foods, such as palm oil, palm kernel oil, and coconut oil.  Avoid foods with partially hydrogenated oils in them. These have trans fats. Examples are stick margarine, some tub margarines, cookies, crackers, and other baked goods. What foods can I eat? Fruits All fresh, canned (in natural juice), or frozen fruits. Vegetables Fresh or frozen vegetables (raw, steamed, roasted, or grilled). Green salads. Grains Most grains. Choose whole wheat and whole grains most of the time. Rice and pasta, including brown rice and pastas made with whole wheat. Meats and other proteins Lean, well-trimmed beef, veal, pork, and lamb. Chicken and [place] without skin. All fish and shellfish. Wild duck, rabbit, pheasant, and venison. Egg whites or low-cholesterol egg substitutes. Dried beans, peas, lentils, and tofu. Seeds and most nuts. Dairy Low-fat or nonfat cheeses, including ricotta and mozzarella. Skim or 1% milk that is liquid, powdered, or evaporated. Buttermilk that is made with low-fat milk. Nonfat or low-fat yogurt. Fats and oils Non-hydrogenated (trans-free) margarines. Vegetable oils, including soybean, sesame, sunflower, olive, peanut, safflower, corn, canola, and cottonseed. Salad dressings or mayonnaise made with a vegetable oil. Beverages Mineral water. Coffee and tea. Diet carbonated beverages. Sweets and desserts Sherbet, gelatin, and fruit ice. Small amounts of dark chocolate. Limit all sweets and desserts. Seasonings and condiments All seasonings and condiments. The items listed above may not be a complete list of foods and drinks you can eat. Contact a  dietitian for more options. What foods should I avoid? Fruits Canned fruit in heavy syrup. Fruit in cream or butter sauce. Fried fruit. Limit coconut. Vegetables Vegetables cooked in cheese, cream, or butter sauce. Fried vegetables. Grains Breads that are made with saturated or trans fats, oils, or whole milk. Croissants. Sweet rolls. Donuts. High-fat crackers, such as cheese crackers. Meats and other proteins Fatty meats, such as hot dogs, ribs, sausage, bacon, rib-eye roast or steak. High-fat deli meats, such as salami and bologna. Caviar. Domestic duck and goose. Organ meats, such as liver. Dairy Cream, sour cream, cream cheese, and creamed cottage cheese. Whole-milk cheeses. Whole or 2% milk that is liquid, evaporated, or condensed. Whole buttermilk. Cream sauce or high-fat cheese sauce. Yogurt that is made from whole milk. Fats and oils Meat fat, or shortening. Cocoa butter, hydrogenated oils, palm oil, coconut oil, palm kernel oil. Solid fats and shortenings, including bacon fat, salt pork, lard, and butter. Nondairy cream substitutes. Salad dressings with cheese or sour cream. Beverages Regular sodas and juice drinks with added sugar. Sweets and desserts Frosting. Pudding. Cookies. Cakes. Pies. Milk chocolate or white chocolate. Buttered syrups. Full-fat ice  cream or ice cream drinks. The items listed above may not be a complete list of foods and drinks to avoid. Contact a dietitian for more information. Summary  Heart-healthy meal planning includes eating less unhealthy fats, eating more healthy fats, and making other changes in your diet.  Eat a balanced diet. This includes fruits and vegetables, low-fat or nonfat dairy, lean protein, nuts and legumes, whole grains, and heart-healthy oils and fats. This information is not intended to replace advice given to you by your health care provider. Make sure you discuss any questions you have with your health care provider. Document  Released: 03/14/2012 Document Revised: 11/17/2017 Document Reviewed: 10/21/2017 Elsevier Patient Education  2020 [employer].

## 2019-07-12 NOTE — Assessment & Plan Note (Signed)
Rick Logan  returns today for follow-up of his PAD.  He has a history of left SFA arthrectomy and drug-coated balloon angioplasty by myself 08/28/2015 with improvement in his ABIs and claudication at that time.  He had recurrent claudication with Dopplers performed 02/26/2027 which revealed a left ABI 0.779 with an occluded left SFA.  Angiography performed 03/08/2019 revealed the SFA to be subtotally occluded at its origin with reconstitution in the adductor canal with three-vessel runoff.  I did not think it was a candidate for endovascular therapy because of the complexity of his anatomy.  I did place him on Pletal empirically for 3 months which did not improve his claudication symptoms.  We will discontinue this.

## 2019-07-12 NOTE — Progress Notes (Signed)
07/12/2019 Rick Logan   08/08/1956  [id number]  Primary Physician [place], [place] At Primary Cardiologist: Lorretta Harp MD Garret Reddish, [place], [place]  HPI:  Rick Logan is a 63 y.o.  father of 3 children, grandfather and8grandchildren referred by Dr. Tollie Pizza for peripheral vascular evaluation because of symptomatic claudication. 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 His cardiovascular risk factor profile is notable for 40-pack-years of tobacco abuse currently smoking one pack per day. There is no family history of heart disease. He has never had a heart attack or stroke. He denies chest pain or shortness of breath. He's noticed claudication for the last 3 months in his left calf particularly notable when walking up an incline. He was noted by his primary care physician to have an absent left pedal pulse and was referred here for further evaluation. His Dopplers showed a high-frequency signal in his proximal left SFA with a decreased ABI. He underwent peripheral angiography and intervention by myself 08/28/15 with directional atherectomy of his proximal and mid left SFA. He had 3 vessel runoff on that side. His follow-up Dopplers performed today revealed a widely patent SFA with a left ABI of 1. His claudication has resolved.  He had an episode of sudden cardiac death 06-02-17 from ventricular fibrillation underwent cardiac catheterization by [place] revealing minimal CAD slightly reduced LV function which ultimately normalized several months later. He subsequently received an ICD by Dr. Caryl Comes who follows that. He denies chest pain or shortness of breath. His major complaint is of left calf claudication which began around that time. He did stop smoking on the day of his sudden cardiac death episode. Recent Doppler studies performed 02/26/2019 revealed a right ABI ABI that was normal and a  left ABI of 0.79 with an occluded left SFA.  I performed peripheral angiography on him 03/08/2019 revealing a totally occluded left SFA from near the origin down to the adductor canal with three-vessel runoff.  I do not think he was a candidate for percutaneous intervention given the length and complexity of his anatomy.  Since I saw him 3 months ago he continues to have left calf claudication despite 3 months of empiric Pletal therapy which we will discontinue.  He gets some occasional numbness in his left foot and also complains of some right groin discomfort.  Unclear the etiology of this.  He denies chest pain but does get some shortness of breath probably related to his long history of tobacco abuse.   Current Meds  Medication Sig  . acetaminophen (TYLENOL 8 HOUR ARTHRITIS PAIN) 650 MG CR tablet Take 650 mg by mouth 2 (two) times daily as needed (pain.).   Rick Logan aspirin EC 81 MG tablet Take 81 mg by mouth daily.  Rick Logan atorvastatin (LIPITOR) 40 MG tablet Take 1 tablet (40 mg total) by mouth daily.  . benazepril (LOTENSIN) 40 MG tablet Take 0.5 tablets (20 mg total) by mouth at bedtime. (Patient taking differently: Take 20 mg by mouth daily. )  . carvedilol (COREG) 3.125 MG tablet TAKE 1 TABLET BY MOUTH TWICE A DAY WITH A MEAL (Patient taking differently: Take 3.125 mg by mouth 2 (two) times a day. )  . cilostazol (PLETAL) 50 MG tablet Take 1 tablet (50 mg total) by mouth 2 (two) times daily.  . famotidine (PEPCID) 20 MG tablet Take 20 mg by mouth every evening.   Rick Logan omeprazole (PRILOSEC)  40 MG capsule Take 40 mg by mouth 2 (two) times a day.     No Known Allergies  Social History   Socioeconomic History  . Marital status: Married    Spouse name: Not on file  . Number of children: Not on file  . Years of education: Not on file  . Highest education level: Not on file  Occupational History  . Not on file  Social Needs  . Financial resource strain: Not on file  . Food insecurity    Worry:  Not on file    Inability: Not on file  . Transportation needs    Medical: Not on file    Non-medical: Not on file  Tobacco Use  . Smoking status: Former Smoker    Packs/day: 1.00    Years: 41.00    Pack years: 41.00    Types: Cigarettes    Quit date: 06/01/2017    Years since quitting: 2.1  . Smokeless tobacco: Never Used  Substance and Sexual Activity  . Alcohol use: No  . Drug use: No  . Sexual activity: Not Currently  Lifestyle  . Physical activity    Days per week: Not on file    Minutes per session: Not on file  . Stress: Not on file  Relationships  . Social Musician on phone: Not on file    Gets together: Not on file    Attends religious service: Not on file    Active member of club or organization: Not on file    Attends meetings of clubs or organizations: Not on file    Relationship status: Not on file  . Intimate partner violence    Fear of current or ex partner: Not on file    Emotionally abused: Not on file    Physically abused: Not on file    Forced sexual activity: Not on file  Other Topics Concern  . Not on file  Social History Narrative   ** Merged History Encounter **         Review of Systems: General: negative for chills, fever, night sweats or weight changes.  Cardiovascular: negative for chest pain, dyspnea on exertion, edema, orthopnea, palpitations, paroxysmal nocturnal dyspnea or shortness of breath Dermatological: negative for rash Respiratory: negative for cough or wheezing Urologic: negative for hematuria Abdominal: negative for nausea, vomiting, diarrhea, bright red blood per rectum, melena, or hematemesis Neurologic: negative for visual changes, syncope, or dizziness All other systems reviewed and are otherwise negative except as noted above.    Blood pressure 116/76, pulse 74, temperature (!) 97 F (36.1 C), temperature source Temporal, height 6' (1.829 m), weight 209 lb (94.8 kg), SpO2 97 %.  General appearance: alert  and no distress Neck: no adenopathy, no carotid bruit, no JVD, supple, symmetrical, trachea midline and thyroid not enlarged, symmetric, no tenderness/mass/nodules Lungs: clear to auscultation bilaterally Heart: regular rate and rhythm, S1, S2 normal, no murmur, click, rub or gallop Extremities: extremities normal, atraumatic, no cyanosis or edema Pulses: 2+ and symmetric Skin: Skin color, texture, turgor normal. No rashes or lesions Neurologic: Alert and oriented X 3, normal strength and tone. Normal symmetric reflexes. Normal coordination and gait  EKG not performed today  ASSESSMENT AND PLAN:   PVD (peripheral vascular disease) [facility] Mr. Vanauken  returns today for follow-up of his PAD.  He has a history of left SFA arthrectomy and drug-coated balloon angioplasty by myself 08/28/2015 with improvement in his ABIs and claudication at that time.  He had recurrent claudication with Dopplers performed 02/26/2027 which revealed a left ABI 0.779 with an occluded left SFA.  Angiography performed 03/08/2019 revealed the SFA to be subtotally occluded at its origin with reconstitution in the adductor canal with three-vessel runoff.  I did not think it was a candidate for endovascular therapy because of the complexity of his anatomy.  I did place him on Pletal empirically for 3 months which did not improve his claudication symptoms.  We will discontinue this.      Runell Gess MD FACP,FACC,FAHA, [facility] 07/12/2019 10:23 AM

## 2019-07-16 ENCOUNTER — Telehealth: Payer: Self-pay | Admitting: Cardiovascular Disease

## 2019-07-16 DIAGNOSIS — I739 Peripheral vascular disease, unspecified: Secondary | ICD-10-CM

## 2019-07-16 NOTE — Telephone Encounter (Signed)
Called patient- he states he was just seen on 10/15- and he would like to have a referral sent to a vascular surgeon to consult with them- I advised patient I would notify Dr.Berry and if given the okay would send information to them-  Patient verbalized understanding.

## 2019-07-16 NOTE — Telephone Encounter (Signed)
Please refer to Dr. Trula Slade at [facility]

## 2019-07-16 NOTE — Telephone Encounter (Signed)
New message:   Patient would like to have a referral to see a Vascular surgeon and would like for someone to call him back.

## 2019-07-16 NOTE — Telephone Encounter (Signed)
Referral sent. Patient notified.

## 2019-07-18 DIAGNOSIS — R1031 Right lower quadrant pain: Secondary | ICD-10-CM | POA: Diagnosis not present

## 2019-07-18 DIAGNOSIS — Z125 Encounter for screening for malignant neoplasm of prostate: Secondary | ICD-10-CM | POA: Diagnosis not present

## 2019-07-18 DIAGNOSIS — R3914 Feeling of incomplete bladder emptying: Secondary | ICD-10-CM | POA: Diagnosis not present

## 2019-07-18 DIAGNOSIS — N401 Enlarged prostate with lower urinary tract symptoms: Secondary | ICD-10-CM | POA: Diagnosis not present

## 2019-07-19 ENCOUNTER — Other Ambulatory Visit: Payer: Self-pay | Admitting: Physician Assistant

## 2019-07-19 ENCOUNTER — Ambulatory Visit
Admission: RE | Admit: 2019-07-19 | Discharge: 2019-07-19 | Disposition: A | Discharge status: Home / Self Care | Payer: Medicare Other | Source: Ambulatory Visit | Attending: Physician Assistant | Admitting: Physician Assistant

## 2019-07-19 DIAGNOSIS — R1030 Lower abdominal pain, unspecified: Secondary | ICD-10-CM | POA: Diagnosis not present

## 2019-07-19 DIAGNOSIS — R1031 Right lower quadrant pain: Secondary | ICD-10-CM

## 2019-07-25 ENCOUNTER — Other Ambulatory Visit: Payer: Self-pay | Admitting: Physician Assistant

## 2019-07-25 DIAGNOSIS — R1031 Right lower quadrant pain: Secondary | ICD-10-CM

## 2019-07-30 ENCOUNTER — Ambulatory Visit
Admission: RE | Admit: 2019-07-30 | Discharge: 2019-07-30 | Disposition: A | Discharge status: Home / Self Care | Payer: Medicare Other | Source: Ambulatory Visit | Attending: Physician Assistant | Admitting: Physician Assistant

## 2019-07-30 DIAGNOSIS — R1031 Right lower quadrant pain: Secondary | ICD-10-CM

## 2019-07-30 DIAGNOSIS — N200 Calculus of kidney: Secondary | ICD-10-CM | POA: Diagnosis not present

## 2019-08-29 DIAGNOSIS — E782 Mixed hyperlipidemia: Secondary | ICD-10-CM | POA: Diagnosis not present

## 2019-08-29 LAB — LIPID PANEL
Chol/HDL Ratio: 3.6 ratio (ref 0.0–5.0)
Cholesterol, Total: 139 mg/dL (ref 100–199)
HDL: 39 mg/dL — ABNORMAL LOW (ref >39)
LDL Chol Calc (NIH): 63 mg/dL (ref 0–99)
Triglycerides: 231 mg/dL — ABNORMAL HIGH (ref 0–149)
VLDL Cholesterol Cal: 37 mg/dL (ref 5–40)

## 2019-08-29 LAB — HEPATIC FUNCTION PANEL
ALT: 35 IU/L (ref 0–44)
AST: 25 IU/L (ref 0–40)
Albumin: 4.1 g/dL (ref 3.8–4.8)
Alkaline Phosphatase: 112 IU/L (ref 39–117)
Bilirubin Total: 0.5 mg/dL (ref 0.0–1.2)
Bilirubin, Direct: 0.15 mg/dL (ref 0.00–0.40)
Total Protein: 6.5 g/dL (ref 6.0–8.5)

## 2019-08-30 ENCOUNTER — Other Ambulatory Visit: Payer: Self-pay

## 2019-08-30 DIAGNOSIS — I739 Peripheral vascular disease, unspecified: Secondary | ICD-10-CM

## 2019-08-31 ENCOUNTER — Ambulatory Visit (HOSPITAL_COMMUNITY)
Admission: RE | Admit: 2019-08-31 | Discharge: 2019-08-31 | Disposition: A | Discharge status: Home / Self Care | Payer: Medicare Other | Source: Ambulatory Visit | Attending: Vascular Surgery | Admitting: Vascular Surgery

## 2019-08-31 ENCOUNTER — Encounter: Payer: Self-pay | Admitting: Vascular Surgery

## 2019-08-31 ENCOUNTER — Other Ambulatory Visit: Payer: Self-pay

## 2019-08-31 ENCOUNTER — Ambulatory Visit (INDEPENDENT_AMBULATORY_CARE_PROVIDER_SITE_OTHER): Payer: Medicare Other | Admitting: Vascular Surgery

## 2019-08-31 VITALS — BP 130/78 | HR 56 | Temp 98.0°F | Resp 20 | Ht 72.0 in | Wt 209.0 lb

## 2019-08-31 DIAGNOSIS — I739 Peripheral vascular disease, unspecified: Secondary | ICD-10-CM | POA: Diagnosis not present

## 2019-08-31 MED ORDER — FENOFIBRATE 145 MG PO TABS: 145.0000 mg | ORAL_TABLET | Freq: Every day | ORAL | 3 refills | Status: DC

## 2019-08-31 NOTE — Addendum Note (Signed)
Addended by: Cain Sieve on: 08/31/2019 09:31 AM   Modules accepted: Orders

## 2019-08-31 NOTE — Progress Notes (Signed)
Patient ID: Rick Logan, male   DOB: [birth year], 63 y.o.   MRN: [id number]  Reason for Consult: Follow-up   Referred by Roe Coombs*  Subjective:     HPI:  Rick Logan is a 63 y.o. male has a history of symptomatic claudication intervened upon by Dr. Gery Pray in the past with directional atherectomy in 02/05/2015.  Patient had sudden cardiac death in 2017-02-04 from ventricular fibrillation now has defibrillator in place.  At that time he quit smoking.  Currently taking aspirin statin.  States that he has claudication in his left calf approximately 50 yards but much less when walking on an incline.  He has undergone angiogram as recently as June no intervention was undertaken.  He is retired.  Denies tissue loss or ulceration.  Risk factors include former history of smoking, hypertension, hyperlipidemia.  Past Medical History:  Diagnosis Date  . Arthritis    "joints get sore at times" (08/28/2015)  . Cardiac arrest (HCC)    Vfib   . Claudication of calf muscles (HCC)    left lower extremity  . Essential hypertension   . Hyperlipidemia   . Hypertension   . PVD (peripheral vascular disease) (HCC)   . Smoking   . Systolic heart failure (HCC)    EF 30-35%  . Tobacco abuse    Family History  Problem Relation Age of Onset  . Cancer Father    Past Surgical History:  Procedure Laterality Date  . ABDOMINAL AORTOGRAM W/LOWER EXTREMITY Bilateral 03/08/2019   Procedure: ABDOMINAL AORTOGRAM W/LOWER EXTREMITY;  Surgeon: Runell Gess, MD;  Location: [facility] INVASIVE CV LAB;  Service: Cardiovascular;  Laterality: Bilateral;  . ABDOMINAL SURGERY     No details available  . APPENDECTOMY    . COLON SURGERY    . EXPLORATORY LAPAROTOMY  1970's X 2   "part of small intestines; part of my bowels; appendix; 2nd OR was for adhesions"  . ICD IMPLANT N/A 06/06/2017   Procedure: ICD Implant;  Surgeon: Duke Salvia, MD;  Location: [facility] INVASIVE CV LAB;  Service: Cardiovascular;   Laterality: N/A;  . LEFT HEART CATH AND CORONARY ANGIOGRAPHY N/A 06/03/2017   Procedure: LEFT HEART CATH AND CORONARY ANGIOGRAPHY;  Surgeon: [place], Peter M, MD;  Location: [facility] INVASIVE CV LAB;  Service: Cardiovascular;  Laterality: N/A;  . PERIPHERAL VASCULAR CATHETERIZATION N/A 08/28/2015   Procedure: Lower Extremity Angiography;  Surgeon: Runell Gess, MD;  Location: [facility] INVASIVE CV LAB;  Service: Cardiovascular;  Laterality: N/A;  . TONSILLECTOMY      Short Social History:  Social History   Tobacco Use  . Smoking status: Former Smoker    Packs/day: 1.00    Years: 41.00    Pack years: 41.00    Types: Cigarettes    Quit date: 06/01/2017    Years since quitting: 2.2  . Smokeless tobacco: Never Used  Substance Use Topics  . Alcohol use: No    No Known Allergies  Current Outpatient Medications  Medication Sig Dispense Refill  . acetaminophen (TYLENOL 8 HOUR ARTHRITIS PAIN) 650 MG CR tablet Take 650 mg by mouth 2 (two) times daily as needed (pain.).     [name] aspirin EC 81 MG tablet Take 81 mg by mouth daily.    [name] atorvastatin (LIPITOR) 80 MG tablet Take 1 tablet (80 mg total) by mouth daily. 90 tablet 3  . benazepril (LOTENSIN) 40 MG tablet Take 0.5 tablets (20 mg total) by mouth at bedtime. (Patient taking differently: Take 20  mg by mouth daily. ) 45 tablet 3  . carvedilol (COREG) 3.125 MG tablet TAKE 1 TABLET BY MOUTH TWICE A DAY WITH A MEAL (Patient taking differently: Take 3.125 mg by mouth 2 (two) times a day. ) 180 tablet 3  . Coenzyme Q10 200 MG capsule Take 1 capsule (200 mg total) by mouth daily. 90 capsule 3  . famotidine (PEPCID) 20 MG tablet Take 20 mg by mouth every evening.     . fenofibrate (TRICOR) 145 MG tablet Take 1 tablet (145 mg total) by mouth daily. 90 tablet 3  . omeprazole (PRILOSEC) 40 MG capsule Take 40 mg by mouth 2 (two) times a day.     No current facility-administered medications for this visit.     Review of Systems  Constitutional:  Constitutional  negative. HENT: HENT negative.  Eyes: Eyes negative.  Respiratory: Respiratory negative.  Cardiovascular: Positive for claudication.  GI: Gastrointestinal negative.  Musculoskeletal: Musculoskeletal negative.  Skin: Skin negative.  Neurological: Neurological negative. Hematologic: Hematologic/lymphatic negative.  Psychiatric: Psychiatric negative.        Objective:  Objective   Vitals:   08/31/19 0948  BP: 130/78  Pulse: (!) 56  Resp: 20  Temp: 98 F (36.7 C)  SpO2: 96%  Weight: 209 lb (94.8 kg)  Height: 6' (1.829 m)   Body mass index is 28.35 kg/m.  Physical Exam HENT:     Head: Normocephalic.     Nose: Nose normal.  Eyes:     Pupils: Pupils are equal, round, and reactive to light.  Neck:     Musculoskeletal: Neck supple.     Vascular: No carotid bruit.  Cardiovascular:     Rate and Rhythm: Normal rate.     Pulses:          Carotid pulses are 2+ on the right side and 2+ on the left side.      Radial pulses are 2+ on the right side and 2+ on the left side.       Femoral pulses are 2+ on the right side and 2+ on the left side.      Popliteal pulses are 1+ on the right side and 0 on the left side.       Dorsalis pedis pulses are 1+ on the right side and 0 on the left side.       Posterior tibial pulses are 1+ on the right side and 0 on the left side.  Abdominal:     General: Abdomen is flat.     Palpations: There is no mass.  Musculoskeletal: Normal range of motion.        General: No swelling.  Skin:    General: Skin is warm and dry.     Capillary Refill: Capillary refill takes less than 2 seconds.  Neurological:     General: No focal deficit present.     Mental Status: He is alert.  Psychiatric:        Mood and Affect: Mood normal.        Behavior: Behavior normal.        Thought Content: Thought content normal.        Judgment: Judgment normal.     Data: I have independently interpreted his ABIs to be 1 on the right and 0.74 on the left.  Toe  pressure on the right 113 and left 83     Assessment/Plan:     63 year old male history of directional atherectomy left SFA for claudication.  Now  has occluded SFA.  We discussed the duration and severity of his symptoms currently he states they are mild he would like to avoid any further procedures if possible.  I reviewed his angiogram from this past June does appear he would potentially be a candidate for stenting.  Patient would like to avoid bypass surgery if at all possible.  Certainly if bypass was necessary he would need cardiac clearance given his extensive history.  This time patient would like to further discuss in 3 months and he will walk as much as possible in the interim.     Maeola Harman MD [facility]

## 2019-09-05 ENCOUNTER — Ambulatory Visit (INDEPENDENT_AMBULATORY_CARE_PROVIDER_SITE_OTHER): Payer: Medicare Other | Admitting: *Deleted

## 2019-09-05 DIAGNOSIS — I469 Cardiac arrest, cause unspecified: Secondary | ICD-10-CM

## 2019-09-05 LAB — CUP PACEART REMOTE DEVICE CHECK
Battery Remaining Longevity: 123 mo
Battery Voltage: 3.01 V
Brady Statistic RV Percent Paced: 0.01 %
Date Time Interrogation Session: 20201209074224
HighPow Impedance: 80 Ohm
Implantable Lead Implant Date: 20180910
Implantable Lead Location: 753860
Implantable Pulse Generator Implant Date: 20180910
Lead Channel Impedance Value: 456 Ohm
Lead Channel Impedance Value: 532 Ohm
Lead Channel Pacing Threshold Amplitude: 0.5 V
Lead Channel Pacing Threshold Pulse Width: 0.4 ms
Lead Channel Sensing Intrinsic Amplitude: 5.25 mV
Lead Channel Sensing Intrinsic Amplitude: 5.25 mV
Lead Channel Setting Pacing Amplitude: 2.5 V
Lead Channel Setting Pacing Pulse Width: 0.4 ms
Lead Channel Setting Sensing Sensitivity: 0.3 mV

## 2019-10-13 NOTE — Progress Notes (Signed)
ICD remote 

## 2019-10-18 ENCOUNTER — Telehealth: Payer: Self-pay | Admitting: Interventional Cardiology

## 2019-10-18 NOTE — Telephone Encounter (Signed)
For a week now, pt has been having dizziness at night when he is laying down.  States he use to have from time to time but has had it everyday for the last week.  States he gets so dizzy that it looks like his ceiling fan is on even though it is not.  Happens mostly when laying on his left side.  No vitals available.  He doesn't have a way to check them.  Feels nauseous when dizzy.  Eventually falls asleep and feels fine when he wakes up and gets to moving.  No issues during the day.  Advised I will send to Dr. Katrinka Blazing to see if any recommendations.

## 2019-10-18 NOTE — Telephone Encounter (Signed)
STAT if patient feels like he/she is going to faint   1) Are you dizzy now? No. Mainly at night.   2) Do you feel faint or have you passed out? Feels faint  3) Do you have any other symptoms? Nausea  4) Have you checked your HR and BP (record if available)? No

## 2019-10-19 NOTE — Telephone Encounter (Signed)
Follow Up:     Pt is calling to find out what Dr Katrinka Blazing said please. He said need to find out asap

## 2019-10-19 NOTE — Telephone Encounter (Signed)
Pt calling to see what Dr. Katrinka Blazing advised. I let patient know that Dr. Katrinka Blazing has not gotten back to [place] yet but we would reach out once he does.

## 2019-10-19 NOTE — Telephone Encounter (Signed)
lpmtcb 12/2 

## 2019-10-19 NOTE — Telephone Encounter (Signed)
This is positional vertigo most likely and needs to be discussed with PCP. Ask him to try Meclizine 12.5 mg po 1 hour before lying down. Okay to dispense 30 tablets.

## 2019-10-22 DIAGNOSIS — Z20822 Contact with and (suspected) exposure to covid-19: Secondary | ICD-10-CM | POA: Diagnosis not present

## 2019-10-22 DIAGNOSIS — R42 Dizziness and giddiness: Secondary | ICD-10-CM | POA: Diagnosis not present

## 2019-10-22 NOTE — Telephone Encounter (Signed)
Spoke with pt and reviewed recommendations.  Pt states he spoke with PCP this morning and they are going to see him at 1:30pm.  Advised this is appropriate.  Pt appreciative for call.

## 2019-10-22 NOTE — Telephone Encounter (Signed)
Left message to call back  

## 2019-11-05 DIAGNOSIS — H8110 Benign paroxysmal vertigo, unspecified ear: Secondary | ICD-10-CM | POA: Diagnosis not present

## 2019-11-06 DIAGNOSIS — H8111 Benign paroxysmal vertigo, right ear: Secondary | ICD-10-CM | POA: Diagnosis not present

## 2019-11-20 DIAGNOSIS — E782 Mixed hyperlipidemia: Secondary | ICD-10-CM | POA: Diagnosis not present

## 2019-11-20 DIAGNOSIS — I739 Peripheral vascular disease, unspecified: Secondary | ICD-10-CM | POA: Diagnosis not present

## 2019-11-21 LAB — HEPATIC FUNCTION PANEL
ALT: 79 IU/L — ABNORMAL HIGH (ref 0–44)
AST: 50 IU/L — ABNORMAL HIGH (ref 0–40)
Albumin: 4.5 g/dL (ref 3.8–4.8)
Alkaline Phosphatase: 124 IU/L — ABNORMAL HIGH (ref 39–117)
Bilirubin Total: 0.6 mg/dL (ref 0.0–1.2)
Bilirubin, Direct: 0.15 mg/dL (ref 0.00–0.40)
Total Protein: 6.8 g/dL (ref 6.0–8.5)

## 2019-11-21 LAB — LIPID PANEL
Chol/HDL Ratio: 5.4 ratio — ABNORMAL HIGH (ref 0.0–5.0)
Cholesterol, Total: 157 mg/dL (ref 100–199)
HDL: 29 mg/dL — ABNORMAL LOW (ref >39)
LDL Chol Calc (NIH): 58 mg/dL (ref 0–99)
Triglycerides: 462 mg/dL — ABNORMAL HIGH (ref 0–149)
VLDL Cholesterol Cal: 70 mg/dL — ABNORMAL HIGH (ref 5–40)

## 2019-11-23 DIAGNOSIS — E782 Mixed hyperlipidemia: Secondary | ICD-10-CM

## 2019-11-23 DIAGNOSIS — E781 Pure hyperglyceridemia: Secondary | ICD-10-CM

## 2019-11-27 DIAGNOSIS — H8111 Benign paroxysmal vertigo, right ear: Secondary | ICD-10-CM | POA: Diagnosis not present

## 2019-11-29 ENCOUNTER — Ambulatory Visit: Payer: Medicare Other | Attending: Internal Medicine

## 2019-11-29 DIAGNOSIS — Z23 Encounter for immunization: Secondary | ICD-10-CM | POA: Insufficient documentation

## 2019-11-29 NOTE — Progress Notes (Signed)
   Covid-19 Vaccination Clinic  Name:  Rick Logan    MRN: [id number] DOB: 06/23/1956  11/29/2019  [name] was observed post Covid-19 immunization for 15 minutes without incident. He was provided with Vaccine Information Sheet and instruction to access the V-Safe system.   [name] was instructed to call 911 with any severe reactions post vaccine: [name] Difficulty breathing  . Swelling of face and throat  . A fast heartbeat  . A bad rash all over body  . Dizziness and weakness   Immunizations Administered    Name Date Dose VIS Date Route   Pfizer COVID-19 Vaccine 11/29/2019  3:40 PM 0.3 mL 09/07/2019 Intramuscular   Manufacturer: [employer], [employer]   Lot: [id number]   NDC: [address]-1

## 2019-11-30 ENCOUNTER — Other Ambulatory Visit: Payer: Self-pay

## 2019-11-30 ENCOUNTER — Ambulatory Visit (INDEPENDENT_AMBULATORY_CARE_PROVIDER_SITE_OTHER): Payer: Medicare Other | Admitting: Vascular Surgery

## 2019-11-30 ENCOUNTER — Encounter: Payer: Self-pay | Admitting: Vascular Surgery

## 2019-11-30 VITALS — BP 113/69 | HR 60 | Temp 97.7°F | Resp 20 | Ht 72.0 in | Wt 211.4 lb

## 2019-11-30 DIAGNOSIS — I739 Peripheral vascular disease, unspecified: Secondary | ICD-10-CM

## 2019-11-30 NOTE — Progress Notes (Signed)
Patient ID: Rick Logan, male   DOB: [birth year], 64 y.o.   MRN: [id number]  Reason for Consult: Follow-up   Referred by Josefa Half*  Subjective:     HPI:  Rick Logan is a 64 y.o. male previous history of claudication intervened with atherectomy in 01/10/15.  He sustained sudden cardiac death 2017-01-09 now has a defibrillator.  He has quit smoking since then does take aspirin and a statin drug.  Does have short distance claudication approximately 50 yards worse when he is walking up hills.  He did have a repeat angiography but no intervention was undertaken.  Risk factors for vascular disease include history of former smoking, hypertension hyperlipidemia.  Denies rest pain or tissue loss.  Past Medical History:  Diagnosis Date  . Arthritis    "joints get sore at times" (08/28/2015)  . Cardiac arrest ([place])    Vfib   . Claudication of calf muscles (HCC)    left lower extremity  . Essential hypertension   . Hyperlipidemia   . Hypertension   . PVD (peripheral vascular disease) ([place])   . Smoking   . Systolic heart failure (HCC)    EF 30-35%  . Tobacco abuse    Family History  Problem Relation Age of Onset  . Cancer Father    Past Surgical History:  Procedure Laterality Date  . ABDOMINAL AORTOGRAM W/LOWER EXTREMITY Bilateral 03/08/2019   Procedure: ABDOMINAL AORTOGRAM W/LOWER EXTREMITY;  Surgeon: Lorretta Harp, MD;  Location: [facility];  Service: Cardiovascular;  Laterality: Bilateral;  . ABDOMINAL SURGERY     No details available  . APPENDECTOMY    . COLON SURGERY    . EXPLORATORY LAPAROTOMY  1970's X 2   "part of small intestines; part of my bowels; appendix; 2nd OR was for adhesions"  . ICD IMPLANT N/A 06/06/2017   Procedure: ICD Implant;  Surgeon: Deboraha Sprang, MD;  Location: [facility];  Service: Cardiovascular;  Laterality: N/A;  . LEFT HEART CATH AND CORONARY ANGIOGRAPHY N/A 06/03/2017   Procedure: LEFT HEART CATH AND CORONARY  ANGIOGRAPHY;  Surgeon: Martinique, Peter M, MD;  Location: [facility];  Service: Cardiovascular;  Laterality: N/A;  . PERIPHERAL VASCULAR CATHETERIZATION N/A 08/28/2015   Procedure: Lower Extremity Angiography;  Surgeon: Lorretta Harp, MD;  Location: [facility];  Service: Cardiovascular;  Laterality: N/A;  . TONSILLECTOMY      Short Social History:  Social History   Tobacco Use  . Smoking status: Former Smoker    Packs/day: 1.00    Years: 41.00    Pack years: 41.00    Types: Cigarettes    Quit date: 06/01/2017    Years since quitting: 2.4  . Smokeless tobacco: Never Used  Substance Use Topics  . Alcohol use: No    No Known Allergies  Current Outpatient Medications  Medication Sig Dispense Refill  . acetaminophen (TYLENOL 8 HOUR ARTHRITIS PAIN) 650 MG CR tablet Take 650 mg by mouth 2 (two) times daily as needed (pain.).     [name] aspirin EC 81 MG tablet Take 81 mg by mouth daily.    . benazepril (LOTENSIN) 40 MG tablet Take 0.5 tablets (20 mg total) by mouth at bedtime. (Patient taking differently: Take 20 mg by mouth daily. ) 45 tablet 3  . carvedilol (COREG) 3.125 MG tablet TAKE 1 TABLET BY MOUTH TWICE A DAY WITH A MEAL (Patient taking differently: Take 3.125 mg by mouth 2 (two) times a  day. ) 180 tablet 3  . Coenzyme Q10 200 MG capsule Take 1 capsule (200 mg total) by mouth daily. 90 capsule 3  . fenofibrate (TRICOR) 145 MG tablet Take 1 tablet (145 mg total) by mouth daily. 90 tablet 3  . omeprazole (PRILOSEC) 40 MG capsule Take 40 mg by mouth 2 (two) times a day.    [name] atorvastatin (LIPITOR) 80 MG tablet Take 1 tablet (80 mg total) by mouth daily. (Patient not taking: Reported on 11/30/2019) 90 tablet 3  . famotidine (PEPCID) 20 MG tablet Take 20 mg by mouth every evening.     . meclizine (ANTIVERT) 25 MG tablet Take 25 mg by mouth 3 (three) times daily as needed.     No current facility-administered medications for this visit.    Review of Systems  Constitutional:   Constitutional negative. HENT: HENT negative.  Eyes: Eyes negative.  Respiratory: Positive for shortness of breath.  Cardiovascular: Positive for claudication.  GI: Gastrointestinal negative.  Musculoskeletal:       Right groin pain Skin: Skin negative.  Neurological: Neurological negative. Hematologic: Hematologic/lymphatic negative.  Psychiatric: Psychiatric negative.        Objective:  Objective   Vitals:   11/30/19 0908  BP: 113/69  Pulse: 60  Resp: 20  Temp: 97.7 F (36.5 C)  SpO2: 95%  Weight: 211 lb 6.4 oz (95.9 kg)  Height: 6' (1.829 m)   Body mass index is 28.67 kg/m.  Physical Exam HENT:     Nose:     Comments: Mask in place Eyes:     Pupils: Pupils are equal, round, and reactive to light.  Cardiovascular:     Rate and Rhythm: Normal rate.     Pulses:          Femoral pulses are 2+ on the right side and 2+ on the left side. Pulmonary:     Effort: Pulmonary effort is normal.  Abdominal:     General: Abdomen is flat.     Palpations: Abdomen is soft. There is no mass.  Musculoskeletal:     Cervical back: Neck supple.  Skin:    General: Skin is dry.  Neurological:     Mental Status: He is alert.  Psychiatric:        Mood and Affect: Mood normal.        Behavior: Behavior normal.        Thought Content: Thought content normal.        Judgment: Judgment normal.     Data: No studies today.  Previous ABI 1 on the right and 0.74 on the left and monophasic  I have reviewed his previous angiography with the patient which demonstrates occluded left SFA.     Assessment/Plan:     64 year old male with left greater than right lower extremity pain with significant claudication at short distance which is severely life limiting.  He has quit smoking has attempted a walking program which has not improved his symptoms.  He is now indicated for angiography.  I congratulated him on smoking cessation.  We will plan for right common femoral approach he does have  some groin pain there we will need to look with ultrasound to make sure there is no pseudoaneurysm at the time of procedure.  Possible invention of the left lower extremity.  If cannot intervene we discussed possible outcomes of continued medical therapy versus surgery.     Maeola Harman MD [facility]

## 2019-12-03 ENCOUNTER — Other Ambulatory Visit: Payer: Self-pay

## 2019-12-03 ENCOUNTER — Ambulatory Visit (INDEPENDENT_AMBULATORY_CARE_PROVIDER_SITE_OTHER): Payer: Medicare Other | Admitting: Internal Medicine

## 2019-12-03 ENCOUNTER — Encounter: Payer: Self-pay | Admitting: Internal Medicine

## 2019-12-03 VITALS — BP 132/68 | HR 68 | Temp 97.0°F | Ht 71.5 in | Wt 212.0 lb

## 2019-12-03 DIAGNOSIS — I739 Peripheral vascular disease, unspecified: Secondary | ICD-10-CM

## 2019-12-03 DIAGNOSIS — E781 Pure hyperglyceridemia: Secondary | ICD-10-CM | POA: Diagnosis not present

## 2019-12-03 DIAGNOSIS — E782 Mixed hyperlipidemia: Secondary | ICD-10-CM | POA: Diagnosis not present

## 2019-12-03 DIAGNOSIS — I2510 Atherosclerotic heart disease of native coronary artery without angina pectoris: Secondary | ICD-10-CM

## 2019-12-03 MED ORDER — VASCEPA 1 G PO CAPS: 2.0000 g | ORAL_CAPSULE | Freq: Two times a day (BID) | ORAL | 11 refills | Status: DC

## 2019-12-03 NOTE — Patient Instructions (Signed)
Medication Instructions:  Your physician has recommended you make the following change in your medication: START vascepa 2 gram twice daily = 2 capsules in AM, 2 capsules in PM  [email or web address] >> disease funds >> hypercholesterolemia   *If you need a refill on your cardiac medications before your next appointment, please call your pharmacy*   Lab Work: FASTING lab work in 3-4 months to check cholesterol - please complete before next visit with Dr. Rennis Golden   If you have labs (blood work) drawn today and your tests are completely normal, you will receive your results only by: [name] MyChart Message (if you have MyChart) OR . A paper copy in the mail If you have any lab test that is abnormal or we need to change your treatment, we will call you to review the results.   Testing/Procedures: NONE   Follow-Up: At [facility], you and your health needs are our priority.  As part of our continuing mission to provide you with exceptional heart care, we have created designated Provider Care Teams.  These Care Teams include your primary Cardiologist (physician) and Advanced Practice Providers (APPs -  Physician Assistants and Nurse Practitioners) who all work together to provide you with the care you need, when you need it.  We recommend signing up for the patient portal called "MyChart".  Sign up information is provided on this After Visit Summary.  MyChart is used to connect with patients for Virtual Visits (Telemedicine).  Patients are able to view lab/test results, encounter notes, upcoming appointments, etc.  Non-urgent messages can be sent to your provider as well.   To learn more about what you can do with MyChart, go to [email or web address].    Your next appointment:   3-4 month(s) - lipid clinic  The format for your next appointment:   In Person  Provider:    Dr. Zoila Shutter    Other Instructions

## 2019-12-03 NOTE — Progress Notes (Signed)
LIPID CLINIC CONSULT NOTE  Chief Complaint:  Follow-up lipids  Primary Care Physician: [facility]  Primary Cardiologist:  Sinclair Grooms, MD  HPI:  Rick Logan is a 64 y.o. male who is being seen today for the evaluation of dyslipidemia at the request of Lorretta Harp, MD. Mr. Caryl Comes with is kindly referred for management of dyslipidemia.  He is a patient of Dr. Tamala Julian, who unfortunately suffered a cardiac arrest for ventricular fibrillation.  He also has a history of hypertension, dyslipidemia and tobacco abuse on and off.  He recently quit again.  He has known bilateral carotid artery disease and is status post AICD placement.  Recently was found to have claudication and was found to have proximal left SFA disease with history of atherectomy of the proximal and mid left SFA.  Repeat imaging in March 08, 2019 revealed totally occluded SFA and it was felt not to be a candidate for percutaneous intervention.  Medical therapy was recommended and referral to lipid clinic was initiated.  His labs 2 months ago showed a total cholesterol 176, triglycerides 740, HDL 29.  He was on atorvastatin 40 mg daily.  Since then he had made significant dietary changes reducing sweets, carbohydrates and other changes including smoking cessation again.  Repeat labs 3 weeks ago showed total cholesterol 161, triglycerides 307, HDL 36 and LDL of 64 (goal LDL less than 70).  While this is a much improved and favorable lipid profile, demonstrating dietary impact on triglycerides, he has persistently elevated triglycerides.  His best triglyceride numbers were a year ago at 151 which is essentially normal therefore diet is likely playing a big impact in this.  Despite this, however, there is persistent risk with elevated triglycerides as seen in the REDUCE-IT trial.   12/03/2019  Mr. Primm is seen today in follow-up.  When we discussed over the summer I had recommended  starting Vascepa 2 g twice daily, however he said the cost would be about $200 a month since he had not met his medication deductible and that was felt to be too expensive to maintain.  We discussed that further today and I would like him to take advantage of some opportunities including the health well foundation which may provide some additional cost for the medicine.  I do not believe that there is a generic alternative for this although a generic has been produced it is not indicated for patients for prevention of coronary or peripheral arterial disease.  Likewise, over-the-counter fish oil, Lovaza, and other preparations did not show cardiovascular risk reduction.  We could also consider possible tier exception.  PMHx:  Past Medical History:  Diagnosis Date  . Arthritis    "joints get sore at times" (08/28/2015)  . Cardiac arrest ([place])    Vfib   . Claudication of calf muscles (HCC)    left lower extremity  . Essential hypertension   . Hyperlipidemia   . Hypertension   . PVD (peripheral vascular disease) ([place])   . Smoking   . Systolic heart failure (HCC)    EF 30-35%  . Tobacco abuse     Past Surgical History:  Procedure Laterality Date  . ABDOMINAL AORTOGRAM W/LOWER EXTREMITY Bilateral 03/08/2019   Procedure: ABDOMINAL AORTOGRAM W/LOWER EXTREMITY;  Surgeon: Lorretta Harp, MD;  Location: [facility];  Service: Cardiovascular;  Laterality: Bilateral;  . ABDOMINAL SURGERY     No details available  . APPENDECTOMY    .  COLON SURGERY    . EXPLORATORY LAPAROTOMY  1970's X 2   "part of small intestines; part of my bowels; appendix; 2nd OR was for adhesions"  . ICD IMPLANT N/A 06/06/2017   Procedure: ICD Implant;  Surgeon: Deboraha Sprang, MD;  Location: [facility];  Service: Cardiovascular;  Laterality: N/A;  . LEFT HEART CATH AND CORONARY ANGIOGRAPHY N/A 06/03/2017   Procedure: LEFT HEART CATH AND CORONARY ANGIOGRAPHY;  Surgeon: Martinique, Peter M, MD;  Location: [facility];  Service: Cardiovascular;  Laterality: N/A;  . PERIPHERAL VASCULAR CATHETERIZATION N/A 08/28/2015   Procedure: Lower Extremity Angiography;  Surgeon: Lorretta Harp, MD;  Location: [facility];  Service: Cardiovascular;  Laterality: N/A;  . TONSILLECTOMY      FAMHx:  Family History  Problem Relation Age of Onset  . Cancer Father     SOCHx:   reports that he quit smoking about 2 years ago. His smoking use included cigarettes. He has a 41.00 pack-year smoking history. He has never used smokeless tobacco. He reports that he does not drink alcohol or use drugs.  ALLERGIES:  No Known Allergies  ROS: Pertinent items noted in HPI and remainder of comprehensive ROS otherwise negative.  HOME MEDS: Current Outpatient Medications on File Prior to Visit  Medication Sig Dispense Refill  . acetaminophen (TYLENOL 8 HOUR ARTHRITIS PAIN) 650 MG CR tablet Take 650 mg by mouth 2 (two) times daily as needed (pain.).     [name] aspirin EC 81 MG tablet Take 81 mg by mouth daily.    [name] atorvastatin (LIPITOR) 80 MG tablet Take 1 tablet (80 mg total) by mouth daily. 90 tablet 3  . benazepril (LOTENSIN) 40 MG tablet Take 0.5 tablets (20 mg total) by mouth at bedtime. (Patient taking differently: Take 20 mg by mouth daily. ) 45 tablet 3  . carvedilol (COREG) 3.125 MG tablet TAKE 1 TABLET BY MOUTH TWICE A DAY WITH A MEAL (Patient taking differently: Take 3.125 mg by mouth 2 (two) times a day. ) 180 tablet 3  . Coenzyme Q10 200 MG capsule Take 1 capsule (200 mg total) by mouth daily. 90 capsule 3   No current facility-administered medications on file prior to visit.    LABS/IMAGING: No results found for this or any previous visit (from the past 48 hour(s)). No results found.  LIPID PANEL:    Component Value Date/Time   CHOL 157 11/20/2019 0937   TRIG 462 (H) 11/20/2019 0937   HDL 29 (L) 11/20/2019 0937   CHOLHDL 5.4 (H) 11/20/2019 0937   LDLCALC 58 11/20/2019 0937   LDLDIRECT 99  06/22/2019 0937    WEIGHTS: Wt Readings from Last 3 Encounters:  12/03/19 212 lb (96.2 kg)  11/30/19 211 lb 6.4 oz (95.9 kg)  08/31/19 209 lb (94.8 kg)    VITALS: BP 132/68 (BP Location: Left Arm, Patient Position: Sitting, Cuff Size: Normal)   Pulse 68   Temp (!) 97 F (36.1 C)   Ht 5' 11.5" (1.816 m)   Wt 212 lb (96.2 kg)   BMI 29.16 kg/m   EXAM: Deferred  EKG: Deferred  ASSESSMENT: 1. Mixed hyperlipidemia, goal LDL less than 70 2. History of aborted sudden cardiac death-VF arrest status post AICD 3. Mild nonobstructive coronary disease 4. Peripheral artery disease with occluded left SFA  PLAN: 1.   Mr. Eddins has a persistent mixed dyslipidemia and is at goal LDL on atorvastatin.  His triglycerides remain high and even perhaps  a little more elevated.  He does report stable alcohol use about 3 beers per day.  I asked him to see if he could decrease that which should help with his numbers.  In addition I feel strongly about Vascepa and we will try to help him with some options for affording it.  This should be associated with significant cardiovascular risk reduction and PAD benefit as well.  No generic substitutions are available.  Plan follow-up 3 to 4 months with repeat lipids.  Pixie Casino, MD, [facility], [place] Director of the Advanced Lipid Disorders &  Cardiovascular Risk Reduction Clinic Diplomate of the American Board of Clinical Lipidology Attending Cardiologist  Direct Dial: [phone]  Fax: [phone]  Website:  [email or web address] Kendarious Gudino 12/03/2019, 1:57 PM

## 2019-12-04 ENCOUNTER — Telehealth: Payer: Self-pay | Admitting: Internal Medicine

## 2019-12-04 NOTE — Telephone Encounter (Signed)
Attempted to call patient to discuss. His voicemail was full

## 2019-12-04 NOTE — Telephone Encounter (Signed)
$  362.81 for 1 month supply - he may not have met his medicare drug deductible, per pharmacy staff at [facility] No prior Josem Kaufmann is required

## 2019-12-04 NOTE — Telephone Encounter (Signed)
Pt c/o medication issue:  1. Name of Medication: VASCEPA 1 g capsule  2. How are you currently taking this medication (dosage and times per day)? n/a  3. Are you having a reaction (difficulty breathing--STAT)? no  4. What is your medication issue? Patient states that this medication is too expensive. He applied for patient assistance from the website he was given but he makes too much money to qualify. He wants to know if there is an alternative medication.

## 2019-12-05 ENCOUNTER — Ambulatory Visit (INDEPENDENT_AMBULATORY_CARE_PROVIDER_SITE_OTHER): Payer: Medicare Other | Admitting: *Deleted

## 2019-12-05 DIAGNOSIS — I469 Cardiac arrest, cause unspecified: Secondary | ICD-10-CM

## 2019-12-05 LAB — CUP PACEART REMOTE DEVICE CHECK
Battery Remaining Longevity: 121 mo
Battery Voltage: 3.01 V
Brady Statistic RV Percent Paced: 0.01 %
Date Time Interrogation Session: 20210310012403
HighPow Impedance: 88 Ohm
Implantable Lead Implant Date: 20180910
Implantable Lead Location: 753860
Implantable Pulse Generator Implant Date: 20180910
Lead Channel Impedance Value: 513 Ohm
Lead Channel Impedance Value: 551 Ohm
Lead Channel Pacing Threshold Amplitude: 0.625 V
Lead Channel Pacing Threshold Pulse Width: 0.4 ms
Lead Channel Sensing Intrinsic Amplitude: 5.625 mV
Lead Channel Sensing Intrinsic Amplitude: 5.625 mV
Lead Channel Setting Pacing Amplitude: 2.5 V
Lead Channel Setting Pacing Pulse Width: 0.4 ms
Lead Channel Setting Sensing Sensitivity: 0.3 mV

## 2019-12-06 NOTE — Progress Notes (Signed)
ICD Remote  

## 2019-12-06 NOTE — Telephone Encounter (Signed)
Attempted to call patient. His VM is full

## 2019-12-07 ENCOUNTER — Other Ambulatory Visit (HOSPITAL_COMMUNITY)
Admission: RE | Admit: 2019-12-07 | Discharge: 2019-12-07 | Disposition: A | Discharge status: Home / Self Care | Payer: Medicare Other | Source: Ambulatory Visit | Attending: Vascular Surgery | Admitting: Vascular Surgery

## 2019-12-07 DIAGNOSIS — Z01812 Encounter for preprocedural laboratory examination: Secondary | ICD-10-CM | POA: Diagnosis not present

## 2019-12-07 DIAGNOSIS — Z20822 Contact with and (suspected) exposure to covid-19: Secondary | ICD-10-CM | POA: Diagnosis not present

## 2019-12-07 LAB — SARS CORONAVIRUS 2 (TAT 6-24 HRS): SARS Coronavirus 2: NEGATIVE

## 2019-12-10 ENCOUNTER — Ambulatory Visit (HOSPITAL_COMMUNITY)
Admission: RE | Admit: 2019-12-10 | Discharge: 2019-12-10 | Disposition: A | Discharge status: Home / Self Care | Payer: Medicare Other | Attending: Vascular Surgery | Admitting: Vascular Surgery

## 2019-12-10 ENCOUNTER — Ambulatory Visit (HOSPITAL_BASED_OUTPATIENT_CLINIC_OR_DEPARTMENT_OTHER): Payer: Medicare Other

## 2019-12-10 ENCOUNTER — Other Ambulatory Visit: Payer: Self-pay

## 2019-12-10 ENCOUNTER — Encounter (HOSPITAL_COMMUNITY)
Admission: RE | Disposition: A | Discharge status: Home / Self Care | Payer: Self-pay | Source: Home / Self Care | Attending: Vascular Surgery

## 2019-12-10 DIAGNOSIS — E785 Hyperlipidemia, unspecified: Secondary | ICD-10-CM | POA: Insufficient documentation

## 2019-12-10 DIAGNOSIS — Z87891 Personal history of nicotine dependence: Secondary | ICD-10-CM | POA: Diagnosis not present

## 2019-12-10 DIAGNOSIS — M79662 Pain in left lower leg: Secondary | ICD-10-CM | POA: Insufficient documentation

## 2019-12-10 DIAGNOSIS — Z8674 Personal history of sudden cardiac arrest: Secondary | ICD-10-CM | POA: Insufficient documentation

## 2019-12-10 DIAGNOSIS — Z7982 Long term (current) use of aspirin: Secondary | ICD-10-CM | POA: Diagnosis not present

## 2019-12-10 DIAGNOSIS — Z95810 Presence of automatic (implantable) cardiac defibrillator: Secondary | ICD-10-CM | POA: Diagnosis not present

## 2019-12-10 DIAGNOSIS — I70213 Atherosclerosis of native arteries of extremities with intermittent claudication, bilateral legs: Secondary | ICD-10-CM | POA: Diagnosis not present

## 2019-12-10 DIAGNOSIS — I110 Hypertensive heart disease with heart failure: Secondary | ICD-10-CM | POA: Diagnosis not present

## 2019-12-10 DIAGNOSIS — I739 Peripheral vascular disease, unspecified: Secondary | ICD-10-CM | POA: Diagnosis not present

## 2019-12-10 DIAGNOSIS — I5022 Chronic systolic (congestive) heart failure: Secondary | ICD-10-CM | POA: Diagnosis not present

## 2019-12-10 DIAGNOSIS — M79661 Pain in right lower leg: Secondary | ICD-10-CM | POA: Insufficient documentation

## 2019-12-10 DIAGNOSIS — Z79899 Other long term (current) drug therapy: Secondary | ICD-10-CM | POA: Insufficient documentation

## 2019-12-10 DIAGNOSIS — Z01810 Encounter for preprocedural cardiovascular examination: Secondary | ICD-10-CM | POA: Diagnosis not present

## 2019-12-10 HISTORY — PX: ABDOMINAL AORTOGRAM W/LOWER EXTREMITY: CATH118223

## 2019-12-10 LAB — POCT I-STAT, CHEM 8
BUN: 13 mg/dL (ref 8–23)
Calcium, Ion: 1.18 mmol/L (ref 1.15–1.40)
Chloride: 105 mmol/L (ref 98–111)
Creatinine, Ser: 0.8 mg/dL (ref 0.61–1.24)
Glucose, Bld: 103 mg/dL — ABNORMAL HIGH (ref 70–99)
HCT: 42 % (ref 39.0–52.0)
Hemoglobin: 14.3 g/dL (ref 13.0–17.0)
Potassium: 4.5 mmol/L (ref 3.5–5.1)
Sodium: 137 mmol/L (ref 135–145)
TCO2: 26 mmol/L (ref 22–32)

## 2019-12-10 SURGERY — ABDOMINAL AORTOGRAM W/LOWER EXTREMITY
Anesthesia: LOCAL | Laterality: Bilateral

## 2019-12-10 MED ORDER — SODIUM CHLORIDE 0.9 % IV SOLN: 250.0000 mL | INTRAVENOUS | Status: DC | PRN

## 2019-12-10 MED ORDER — LABETALOL HCL 5 MG/ML IV SOLN: 10.0000 mg | INTRAVENOUS | Status: DC | PRN

## 2019-12-10 MED ORDER — OXYCODONE HCL 5 MG PO TABS: 5.0000 mg | ORAL_TABLET | ORAL | Status: DC | PRN

## 2019-12-10 MED ORDER — IODIXANOL 320 MG/ML IV SOLN
INTRAVENOUS | Status: DC | PRN
  Administered 2019-12-10: 100 mL

## 2019-12-10 MED ORDER — HEPARIN (PORCINE) IN NACL 1000-0.9 UT/500ML-% IV SOLN
INTRAVENOUS | Status: AC
  Filled 2019-12-10: qty 500

## 2019-12-10 MED ORDER — FENTANYL CITRATE (PF) 100 MCG/2ML IJ SOLN
INTRAMUSCULAR | Status: AC
  Filled 2019-12-10: qty 2

## 2019-12-10 MED ORDER — LIDOCAINE HCL (PF) 1 % IJ SOLN
INTRAMUSCULAR | Status: AC
  Filled 2019-12-10: qty 30

## 2019-12-10 MED ORDER — FENTANYL CITRATE (PF) 100 MCG/2ML IJ SOLN
INTRAMUSCULAR | Status: DC | PRN
  Administered 2019-12-10: 50 ug via INTRAVENOUS

## 2019-12-10 MED ORDER — SODIUM CHLORIDE 0.9 % IV SOLN: INTRAVENOUS | Status: DC

## 2019-12-10 MED ORDER — SODIUM CHLORIDE 0.9% FLUSH: 3.0000 mL | Freq: Two times a day (BID) | INTRAVENOUS | Status: DC

## 2019-12-10 MED ORDER — ONDANSETRON HCL 4 MG/2ML IJ SOLN: 4.0000 mg | Freq: Four times a day (QID) | INTRAMUSCULAR | Status: DC | PRN

## 2019-12-10 MED ORDER — ACETAMINOPHEN 325 MG PO TABS: 650.0000 mg | ORAL_TABLET | ORAL | Status: DC | PRN

## 2019-12-10 MED ORDER — HYDRALAZINE HCL 20 MG/ML IJ SOLN: 5.0000 mg | INTRAMUSCULAR | Status: DC | PRN

## 2019-12-10 MED ORDER — ROSUVASTATIN CALCIUM 10 MG PO TABS: 10.0000 mg | ORAL_TABLET | Freq: Every day | ORAL | Status: DC

## 2019-12-10 MED ORDER — HEPARIN (PORCINE) IN NACL 1000-0.9 UT/500ML-% IV SOLN
INTRAVENOUS | Status: DC | PRN
  Administered 2019-12-10 (×2): 500 mL

## 2019-12-10 MED ORDER — LIDOCAINE HCL (PF) 1 % IJ SOLN
INTRAMUSCULAR | Status: DC | PRN
  Administered 2019-12-10: 15 mL

## 2019-12-10 MED ORDER — SODIUM CHLORIDE 0.9% FLUSH: 3.0000 mL | INTRAVENOUS | Status: DC | PRN

## 2019-12-10 MED ORDER — MIDAZOLAM HCL 2 MG/2ML IJ SOLN
INTRAMUSCULAR | Status: AC
  Filled 2019-12-10: qty 2

## 2019-12-10 MED ORDER — MORPHINE SULFATE (PF) 2 MG/ML IV SOLN: 2.0000 mg | INTRAVENOUS | Status: DC | PRN

## 2019-12-10 MED ORDER — MIDAZOLAM HCL 2 MG/2ML IJ SOLN
INTRAMUSCULAR | Status: DC | PRN
  Administered 2019-12-10: 1 mg via INTRAVENOUS

## 2019-12-10 SURGICAL SUPPLY — 10 items
CATH OMNI FLUSH 5F 65CM (CATHETERS) ×1 IMPLANT
CLOSURE MYNX CONTROL 5F (Vascular Products) ×1 IMPLANT
KIT MICROPUNCTURE NIT STIFF (SHEATH) ×1 IMPLANT
KIT PV (KITS) ×2 IMPLANT
SHEATH PINNACLE 5F 10CM (SHEATH) ×1 IMPLANT
SHEATH PROBE COVER 6X72 (BAG) ×1 IMPLANT
SYR MEDRAD MARK V 150ML (SYRINGE) ×1 IMPLANT
TRANSDUCER W/STOPCOCK (MISCELLANEOUS) ×2 IMPLANT
TRAY PV CATH (CUSTOM PROCEDURE TRAY) ×2 IMPLANT
WIRE BENTSON .035X145CM (WIRE) ×1 IMPLANT

## 2019-12-10 NOTE — Progress Notes (Signed)
Bilateral lower extremity vein mapping completed. Refer to "CV Proc" under chart review to view preliminary results.  12/10/2019 2:00 PM Eula Fried., MHA, RVT, RDCS, RDMS

## 2019-12-10 NOTE — Discharge Instructions (Signed)
Angiogram, Care After This sheet gives you information about how to care for yourself after your procedure. Your health care provider may also give you more specific instructions. If you have problems or questions, contact your health care provider. What can I expect after the procedure? After the procedure, it is common to have bruising and tenderness at the catheter insertion area. Follow these instructions at home: Insertion site care  Follow instructions from your health care provider about how to take care of your insertion site. Make sure you: ? Wash your hands with soap and water before you change your bandage (dressing). If soap and water are not available, use hand sanitizer. ? Change your dressing as told by your health care provider. ? Leave stitches (sutures), skin glue, or adhesive strips in place. These skin closures may need to stay in place for 2 weeks or longer. If adhesive strip edges start to loosen and curl up, you may trim the loose edges. Do not remove adhesive strips completely unless your health care provider tells you to do that.  Do not take baths, swim, or use a hot tub until your health care provider approves.  You may shower 24-48 hours after the procedure or as told by your health care provider. ? Gently wash the site with plain soap and water. ? Pat the area dry with a clean towel. ? Do not rub the site. This may cause bleeding.  Do not apply powder or lotion to the site. Keep the site clean and dry.  Check your insertion site every day for signs of infection. Check for: ? Redness, swelling, or pain. ? Fluid or blood. ? Warmth. ? Pus or a bad smell. Activity  Rest as told by your health care provider, usually for 1-2 days.  Do not lift anything that is heavier than 10 lbs. (4.5 kg) or as told by your health care provider.  Do not drive for 24 hours if you were given a medicine to help you relax (sedative).  Do not drive or use heavy machinery while  taking prescription pain medicine. General instructions   Return to your normal activities as told by your health care provider, usually in about a week. Ask your health care provider what activities are safe for you.  If the catheter site starts bleeding, lie flat and put pressure on the site. If the bleeding does not stop, get help right away. This is a medical emergency.  Drink enough fluid to keep your urine clear or pale yellow. This helps flush the contrast dye from your body.  Take over-the-counter and prescription medicines only as told by your health care provider.  Keep all follow-up visits as told by your health care provider. This is important. Contact a health care provider if:  You have a fever or chills.  You have redness, swelling, or pain around your insertion site.  You have fluid or blood coming from your insertion site.  The insertion site feels warm to the touch.  You have pus or a bad smell coming from your insertion site.  You have bruising around the insertion site.  You notice blood collecting in the tissue around the catheter site (hematoma). The hematoma may be painful to the touch. Get help right away if:  You have severe pain at the catheter insertion area.  The catheter insertion area swells very fast.  The catheter insertion area is bleeding, and the bleeding does not stop when you hold steady pressure on the area.    The area near or just beyond the catheter insertion site becomes pale, cool, tingly, or numb. These symptoms may represent a serious problem that is an emergency. Do not wait to see if the symptoms will go away. Get medical help right away. Call your local emergency services (911 in the [place] Do not drive yourself to the hospital. Summary  After the procedure, it is common to have bruising and tenderness at the catheter insertion area.  After the procedure, it is important to rest and drink plenty of fluids.  Do not take baths,  swim, or use a hot tub until your health care provider says it is okay to do so. You may shower 24-48 hours after the procedure or as told by your health care provider.  If the catheter site starts bleeding, lie flat and put pressure on the site. If the bleeding does not stop, get help right away. This is a medical emergency. This information is not intended to replace advice given to you by your health care provider. Make sure you discuss any questions you have with your health care provider. Document Revised: 08/26/2017 Document Reviewed: 08/18/2016 Elsevier Patient Education  2020 [employer]  

## 2019-12-10 NOTE — H&P (Signed)
   History and Physical Update  The patient was interviewed and re-examined.  The patient's previous History and Physical has been reviewed and is unchanged from recent office visit. Plan for aortogram with bilateral runoff and possible intervention.   Julea Hutto C. Randie Heinz, MD [facility] [place] [facility] Office: [phone] Pager: [phone]  12/10/2019, 11:25 AM

## 2019-12-10 NOTE — Op Note (Signed)
    Patient name: Rick Logan MRN: [id number] DOB: 09/18/1956 Sex: male  12/10/2019 Pre-operative Diagnosis: life limiting claudication Post-operative diagnosis:  Same Surgeon:  Apolinar Junes C. Randie Heinz, MD Procedure Performed: 1.  US guided cannulation of right common femoral artery 2.  Aortogram with bilateral runoff 3.  Moderate sedation with fentanyl and versed for 25 minutes 4.  Minx device closure right common femoral artery   Indications: 64 year old male with severe life limiting claudication.  He has had previous endovascular intervention of the left lower extremity.  He is now indicated for angiogram possible intervention.  Findings: Aorta and iliac segments are free of flow-limiting stenosis.  Right side has multiple nonflow limiting stenosis in the SFA.  Popliteal with three-vessel runoff to the foot on the right.  Left side SFA is flush occluded reconstitutes really above-knee pop is healthy appearing at the below-knee popliteal artery with three-vessel runoff to the foot.  Patient will be scheduled for left femoral to popliteal artery bypass and vein mapping will be performed today   Procedure:  The patient was identified in the holding area and taken to room 8.  The patient was then placed supine on the table and prepped and draped in the usual sterile fashion.  A time out was called.  Ultrasound was used to evaluate the right common femoral artery.  This was cannulated with direct ultrasound visualization micropuncture needle followed wire and sheath.  Images saved the prior record.  Bentson wires placed followed by 5 [place] sheath.  Omni catheter placed to level L1 aortogram performed.  We then performed bilateral lower extremity runoff with the above findings.  We remove the catheter over wire.  Minx device was used to close the common femoral artery.  He tolerated procedure without any complication.  Contrast: 100 cc   Gertrue Willette C. Randie Heinz, MD [facility]  [place] Office: [phone] Pager: [phone]

## 2019-12-12 ENCOUNTER — Telehealth: Payer: Self-pay | Admitting: Internal Medicine

## 2019-12-12 NOTE — Telephone Encounter (Signed)
New message      [facility] Pre-operative Risk Assessment    Request for surgical clearance:  1. What type of surgery is being performed? Left  Fempop Bypass  2. When is this surgery scheduled? TBD  3. What type of clearance is required (medical clearance vs. Pharmacy clearance to hold med vs. Both)? medical  4. Are there any medications that need to be held prior to surgery and how long?let provider decide  per [facility]  5. Practice name and name of physician performing surgery? [facility], Dr. Servando Snare  6. What is your office phone number? [phone]   7.   What is your office fax number [phone] Attn: Larene Beach  8.   Anesthesia type (None, local, MAC, general) ? Don't know at this time per [facility]    Maryjane Hurter 12/12/2019, 10:01 AM  _________________________________________________________________   (provider comments below)

## 2019-12-12 NOTE — Telephone Encounter (Signed)
   Primary Cardiologist: Lesleigh Noe, MD  Chart reviewed as part of pre-operative protocol coverage. The past was doing well on cardiac stand point when seen by Lipid clinic by Dr. Rennis Golden. Given past medical history and time since last visit, based on ACC/AHA guidelines, BOSCO PAPARELLA would be at acceptable risk for the planned procedure without further cardiovascular testing.   I will route this recommendation to the requesting party via Epic fax function and remove from pre-op pool.  Please call with questions.  [place], [place] 12/12/2019, 12:54 PM

## 2019-12-14 NOTE — Telephone Encounter (Signed)
Patient states his tier 1 and 2 drugs are covered 100% [place] in on tier 4 Advised will submit tier exception request

## 2019-12-18 ENCOUNTER — Other Ambulatory Visit: Payer: Self-pay

## 2019-12-19 NOTE — Telephone Encounter (Signed)
Faxed tier exception to [facility] @ [phone]

## 2019-12-19 NOTE — Telephone Encounter (Signed)
Called [employer] to attempt tier exception via phone. This cannot be completed as statement of medical necessity is to be faxed Ref # for call: [id number]

## 2019-12-20 NOTE — Telephone Encounter (Signed)
Faxed additional clinical information to [facility] @ [phone] (form that was provided denoting that formulary alternatives would not be effective and letter of medical necessity)

## 2019-12-21 NOTE — Telephone Encounter (Signed)
Patient is returning call.  °

## 2019-12-21 NOTE — Telephone Encounter (Signed)
Per [employer] (phone: [phone]), Tillman Sers has been lowered to tier 3.   Attempted to call patient with this info but his VM is full

## 2019-12-21 NOTE — Telephone Encounter (Signed)
Patient aware medication has been moved to lower tier per insurance Advised his pharmacy can re-process and notify him of co-payment

## 2019-12-26 ENCOUNTER — Ambulatory Visit: Payer: Medicare Other | Attending: Internal Medicine

## 2019-12-26 DIAGNOSIS — Z23 Encounter for immunization: Secondary | ICD-10-CM

## 2019-12-26 NOTE — Progress Notes (Signed)
   Covid-19 Vaccination Clinic  Name:  Rick Logan    MRN: [id number] DOB: March 09, 1956  12/26/2019  [name] was observed post Covid-19 immunization for 15 minutes without incident. He was provided with Vaccine Information Sheet and instruction to access the V-Safe system.   [name] was instructed to call 911 with any severe reactions post vaccine: [name] Difficulty breathing  . Swelling of face and throat  . A fast heartbeat  . A bad rash all over body  . Dizziness and weakness   Immunizations Administered    Name Date Dose VIS Date Route   Pfizer COVID-19 Vaccine 12/26/2019  8:30 AM 0.3 mL 09/07/2019 Intramuscular   Manufacturer: [employer], [employer]   Lot: [id number]   NDC: [address]-2

## 2020-01-04 NOTE — Pre-Procedure Instructions (Signed)
SAKET HELLSTROM  01/04/2020      [id number] - [place], [place] - [address] [place] [address] Phone: [phone] Fax: [phone]    Your procedure is scheduled on 01/10/20.  Report to [facility] Admitting at 530 A.M.  Call this number if you have problems the morning of surgery:  [phone]   Remember:  Do not eat or drink after midnight.  Y   Take these medicines the morning of surgery with A SIP OF WATER ---TYLENOL,CARVEDILOL    Do not wear jewelry, make-up or nail polish.  Do not wear lotions, powders, or perfumes, or deodorant.  Do not shave 48 hours prior to surgery.  Men may shave face and neck.  Do not bring valuables to the hospital.  [facility] is not responsible for any belongings or valuables.  Contacts, dentures or bridgework may not be worn into surgery.  Leave your suitcase in the car.  After surgery it may be brought to your room.  For patients admitted to the hospital, discharge time will be determined by your treatment team.  Patients discharged the day of surgery will not be allowed to drive home.    Special instructions:  -----Do not take any aspirin,anti-inflammatories,vitamins,or herbal supplements 5-7 days prior to surgery.  Please read over the following fact sheets that you were given. MRSA Information   [place] - Preparing for Surgery  Before surgery, you can play an important role.  Because skin is not sterile, your skin needs to be as free of germs as possible.  You can reduce the number of germs on you skin by washing with CHG (chlorahexidine gluconate) soap before surgery.  CHG is an antiseptic cleaner which kills germs and bonds with the skin to continue killing germs even after washing.  Oral Hygiene is also important in reducing the risk of infection.  Remember to brush your teeth with your regular toothpaste the morning of surgery.  Please DO  NOT use if you have an allergy to CHG or antibacterial soaps.  If your skin becomes reddened/irritated stop using the CHG and inform your nurse when you arrive at Short Stay.  Do not shave (including legs and underarms) for at least 48 hours prior to the first CHG shower.  You may shave your face.  Please follow these instructions carefully:   1.  Shower with CHG Soap the night before surgery and the morning of Surgery.  2.  If you choose to wash your hair, wash your hair first as usual with your normal shampoo.  3.  After you shampoo, rinse your hair and body thoroughly to remove the shampoo. 4.  Use CHG as you would any other liquid soap.  You can apply chg directly to the skin and wash gently with a      scrungie or washcloth.           5.  Apply the CHG Soap to your body ONLY FROM THE NECK DOWN.   Do not use on open wounds or open sores. Avoid contact with your eyes, ears, mouth and genitals (private parts).  Wash genitals (private parts) with your normal soap.  6.  Wash thoroughly, paying special attention to the area where your surgery will be performed.  7.  Thoroughly rinse your body with warm water from the neck down.  8.  DO NOT shower/wash with your normal soap after  using and rinsing off the CHG Soap.  9.  Pat yourself dry with a clean towel.            10.  Wear clean pajamas.            11.  Place clean sheets on your bed the night of your first shower and do not sleep with pets.  Day of Surgery  Do not apply any lotions/deoderants the morning of surgery.   Please wear clean clothes to the hospital/surgery center. Remember to brush your teeth with toothpaste.

## 2020-01-07 ENCOUNTER — Encounter (HOSPITAL_COMMUNITY): Payer: Self-pay

## 2020-01-07 ENCOUNTER — Encounter (HOSPITAL_COMMUNITY)
Admission: RE | Admit: 2020-01-07 | Discharge: 2020-01-07 | Disposition: A | Discharge status: Home / Self Care | Payer: Medicare Other | Source: Ambulatory Visit | Attending: Vascular Surgery | Admitting: Vascular Surgery

## 2020-01-07 ENCOUNTER — Other Ambulatory Visit: Payer: Self-pay

## 2020-01-07 ENCOUNTER — Encounter: Payer: Self-pay | Admitting: Internal Medicine

## 2020-01-07 DIAGNOSIS — I739 Peripheral vascular disease, unspecified: Secondary | ICD-10-CM | POA: Insufficient documentation

## 2020-01-07 DIAGNOSIS — Z01812 Encounter for preprocedural laboratory examination: Secondary | ICD-10-CM | POA: Insufficient documentation

## 2020-01-07 DIAGNOSIS — Z95810 Presence of automatic (implantable) cardiac defibrillator: Secondary | ICD-10-CM | POA: Insufficient documentation

## 2020-01-07 DIAGNOSIS — Z87891 Personal history of nicotine dependence: Secondary | ICD-10-CM | POA: Insufficient documentation

## 2020-01-07 DIAGNOSIS — I252 Old myocardial infarction: Secondary | ICD-10-CM | POA: Insufficient documentation

## 2020-01-07 DIAGNOSIS — E785 Hyperlipidemia, unspecified: Secondary | ICD-10-CM | POA: Insufficient documentation

## 2020-01-07 DIAGNOSIS — Z79899 Other long term (current) drug therapy: Secondary | ICD-10-CM | POA: Insufficient documentation

## 2020-01-07 DIAGNOSIS — I5022 Chronic systolic (congestive) heart failure: Secondary | ICD-10-CM | POA: Insufficient documentation

## 2020-01-07 DIAGNOSIS — I110 Hypertensive heart disease with heart failure: Secondary | ICD-10-CM | POA: Insufficient documentation

## 2020-01-07 HISTORY — DX: Disorder of arteries and arterioles, unspecified: I77.9

## 2020-01-07 HISTORY — DX: Acute myocardial infarction, unspecified: I21.9

## 2020-01-07 HISTORY — DX: Presence of automatic (implantable) cardiac defibrillator: Z95.810

## 2020-01-07 LAB — CBC
HCT: 45 % (ref 39.0–52.0)
Hemoglobin: 15 g/dL (ref 13.0–17.0)
MCH: 31.2 pg (ref 26.0–34.0)
MCHC: 33.3 g/dL (ref 30.0–36.0)
MCV: 93.6 fL (ref 80.0–100.0)
Platelets: 257 10*3/uL (ref 150–400)
RBC: 4.81 MIL/uL (ref 4.22–5.81)
RDW: 12.7 % (ref 11.5–15.5)
WBC: 7.8 10*3/uL (ref 4.0–10.5)
nRBC: 0 % (ref 0.0–0.2)

## 2020-01-07 LAB — URINALYSIS, ROUTINE W REFLEX MICROSCOPIC
Bilirubin Urine: NEGATIVE
Glucose, UA: NEGATIVE mg/dL
Hgb urine dipstick: NEGATIVE
Ketones, ur: NEGATIVE mg/dL
Leukocytes,Ua: NEGATIVE
Nitrite: NEGATIVE
Protein, ur: NEGATIVE mg/dL
Specific Gravity, Urine: 1.021 (ref 1.005–1.030)
pH: 5 (ref 5.0–8.0)

## 2020-01-07 LAB — COMPREHENSIVE METABOLIC PANEL
ALT: 41 U/L (ref 0–44)
AST: 30 U/L (ref 15–41)
Albumin: 3.8 g/dL (ref 3.5–5.0)
Alkaline Phosphatase: 90 U/L (ref 38–126)
Anion gap: 10 (ref 5–15)
BUN: 10 mg/dL (ref 8–23)
CO2: 22 mmol/L (ref 22–32)
Calcium: 9 mg/dL (ref 8.9–10.3)
Chloride: 104 mmol/L (ref 98–111)
Creatinine, Ser: 0.87 mg/dL (ref 0.61–1.24)
GFR calc Af Amer: >60 mL/min (ref >60)
GFR calc non Af Amer: >60 mL/min (ref >60)
Glucose, Bld: 110 mg/dL — ABNORMAL HIGH (ref 70–99)
Potassium: 4.3 mmol/L (ref 3.5–5.1)
Sodium: 136 mmol/L (ref 135–145)
Total Bilirubin: 0.9 mg/dL (ref 0.3–1.2)
Total Protein: 6.5 g/dL (ref 6.5–8.1)

## 2020-01-07 LAB — TYPE AND SCREEN
ABO/RH(D): O POS
Antibody Screen: NEGATIVE

## 2020-01-07 LAB — PROTIME-INR
INR: 1 (ref 0.8–1.2)
Prothrombin Time: 13.5 seconds (ref 11.4–15.2)

## 2020-01-07 LAB — SURGICAL PCR SCREEN
MRSA, PCR: NEGATIVE
Staphylococcus aureus: NEGATIVE

## 2020-01-07 LAB — ABO/RH: ABO/RH(D): O POS

## 2020-01-07 LAB — APTT: aPTT: 27 seconds (ref 24–36)

## 2020-01-07 MED ORDER — CHLORHEXIDINE GLUCONATE CLOTH 2 % EX PADS: 6.0000 | MEDICATED_PAD | Freq: Once | CUTANEOUS | Status: DC

## 2020-01-07 NOTE — Progress Notes (Signed)
PERIOPERATIVE PRESCRIPTION FOR IMPLANTED CARDIAC DEVICE PROGRAMMING  Patient Information: Name:  Rick Logan  DOB:  1955-11-30  MRN:  [id number]    Planned Procedure: FEM/POP BYPASS GRAFT  Surgeon: DR Randie Heinz  Date of Procedure: 01/10/20  Cautery will be used.  Position during surgery: SUPINE   Please send documentation back to:  [facility] (Fax # [phone])   Device Information:  Clinic EP Physician:  Sherryl Manges, MD  Device Type:  Defibrillator Manufacturer and Phone #:  Medtronic: [phone] Pacemaker Dependent?:  No. Date of Last Device Check:  12/05/2019 Normal Device Function?:  Yes.    Electrophysiologist's Recommendations:   Have magnet available.  Provide continuous ECG monitoring when magnet is used or reprogramming is to be performed.   Procedure should not interfere with device function.  No device programming or magnet placement needed.  Per Device Clinic [address], [place], [place]  5:23 PM 01/07/2020

## 2020-01-07 NOTE — Progress Notes (Addendum)
PCP - HILTY Cardiologist - henry smith     Clearance DONE  PPM/ICD - ICD Device Orders - FAXED    ALSO DEVICE CLINIC EMAILED Rep Notified - YES, BY EMAIL  Chest x-ray - NA EKG - 6/20 Stress Test - NA ECHO -8/20  Cardiac Cath - 9/18  P -Blood Thinner Instructions:NA Aspirin InstructionsSTOP:     COVID TEST- FOR THE 14TH   Anesthesia review: HEART HX Patient denies shortness of breath, fever, cough and chest pain at PAT appointment   All instructions explained to the patient, with a verbal understanding of the material. Patient agrees to go over the instructions while at home for a better understanding. Patient also instructed to self quarantine after being tested for COVID-19. The opportunity to ask questions was provided.

## 2020-01-08 ENCOUNTER — Encounter (HOSPITAL_COMMUNITY): Payer: Self-pay

## 2020-01-08 NOTE — Anesthesia Preprocedure Evaluation (Addendum)
Anesthesia Evaluation  Patient identified by MRN, date of birth, ID band Patient awake    Reviewed: Allergy & Precautions, NPO status , Patient's Chart, lab work & pertinent test results, reviewed documented beta blocker date and time   Airway Mallampati: I  TM Distance: >3 FB Neck ROM: Full    Dental no notable dental hx. (+) Dental Advisory Given, Edentulous Upper, Edentulous Lower   Pulmonary shortness of breath and with exertion, former smoker,  Quit smoking 2018, 41 pack year history    Pulmonary exam normal breath sounds clear to auscultation       Cardiovascular hypertension, Pt. on medications and Pt. on home beta blockers + Past MI, + Peripheral Vascular Disease and +CHF  Normal cardiovascular exam+ dysrhythmias Ventricular Fibrillation + Cardiac Defibrillator  Rhythm:Regular Rate:Normal  HLD  Last echo 04/2019: 1. The left ventricle has normal systolic function, with an ejection  fraction of 55-60%. The cavity size was normal. There is mildly increased  left ventricular wall thickness. Left ventricular diastolic Doppler  parameters are consistent with impaired  relaxation.  2. The right ventricle has normal systolic function. The cavity was  normal.  3. The mitral valve is grossly normal.  4. The tricuspid valve is grossly normal.  5. The aortic valve is tricuspid. Mild calcification of the aortic valve.  No stenosis of the aortic valve.  6. The aorta is normal unless otherwise noted.  7. Normal LV systolic function; mild LVH; mild diastolic dysfunction.  Last cath 2018: 1. Minor nonobstructive CAD 2. Moderate LV dysfunction. There a segmental wall motion abnormalities involving the mid ventricular segments and inferoapex. Consider Takotsubo syndrome. 3. Normal LVEDP.   Hx Vfib arrest with ICM, LVEF 30% which has now recovered. S/p AICD placement 2018- witnessed syncope 06/01/17, aborted Vfib arrest by EMS,  s/p CPR x 20 min, defib x4, epi x2 with ROSC,    Neuro/Psych Carotid US 01/31/19: Summary:  - Right Carotid: Velocities in the right ICA are consistent with a 60-79% stenosis.  - Left Carotid: Velocities in the left ICA are consistent with a 1-39% stenosis.  - Vertebrals: Bilateral vertebral arteries demonstrate antegrade flow.  - Subclavians: Normal flow hemodynamics were seen in bilateral subclavian arteries.  negative psych ROS   GI/Hepatic negative GI ROS, Neg liver ROS,   Endo/Other  negative endocrine ROS  Renal/GU negative Renal ROS  negative genitourinary   Musculoskeletal  (+) Arthritis , Osteoarthritis,    Abdominal Normal abdominal exam  (+)   Peds negative pediatric ROS (+)  Hematology negative hematology ROS (+)   Anesthesia Other Findings   Reproductive/Obstetrics negative OB ROS                            Anesthesia Physical Anesthesia Plan  ASA: III  Anesthesia Plan: General   Post-op Pain Management:    Induction: Intravenous  PONV Risk Score and Plan: 2 and Ondansetron, Dexamethasone and Treatment may vary due to age or medical condition  Airway Management Planned: Oral ETT  Additional Equipment: Arterial line  Intra-op Plan:   Post-operative Plan: Extubation in OR  Informed Consent: I have reviewed the patients History and Physical, chart, labs and discussed the procedure including the risks, benefits and alternatives for the proposed anesthesia with the patient or authorized representative who has indicated his/her understanding and acceptance.     Dental advisory given  Plan Discussed with: CRNA  Anesthesia Plan Comments: (ICD perioperative orders: Device Type:Defibrillator (  Medtronic: [phone]) Pacemaker Dependent?:No. Date of Last Device Check:3/10/2021Normal Device Function?:Yes. Electrophysiologist's Recommendations:  Have magnet available.  Provide continuous ECG monitoring  when magnet is used or reprogramming is to be performed.  Procedure should not interfere with device function. No device programming or magnet placement needed. )      Anesthesia Quick Evaluation

## 2020-01-08 NOTE — Progress Notes (Addendum)
Anesthesia Chart Review:  Case: [id number] Date/Time: 01/10/20 0715   Procedure: BYPASS GRAFT FEMORAL-POPLITEAL ARTERY (Left )   Anesthesia type: General   Pre-op diagnosis: PERIPHERAL ARTERY DISEASE   [facility] ROOM 11 / [facility] OR   Surgeons: Maeola Harman, MD      DISCUSSION: Patient is a 64 year old male scheduled for the above procedure.  History includes former smoker (quit 06/01/17), HTN, PVD/claudication (left SFA atherectomy/angioplasty 08/28/15), HLD, CAD (witnessed syncope 06/01/17, aborted Vfib arrest by EMS, s/p CPR x 20 min, defib x4, epi x2 with ROSC, non-obstructive CAD, EF 35-45%, consider Takotsubo Cardiomyopathy, s/p Medtronic ICD for secondary prevention 06/06/17), chronic systolic CHF, carotid artery stenosis, dyspnea.   Preoperative cardiology input outlined on 12/12/19 by Manson Passey, PA, "...based on ACC/AHA guidelines, Rick Logan would be at acceptable risk for the planned procedure without further cardiovascular testing."  ICD perioperative orders: Device Type:  Defibrillator (Medtronic: [phone]) Pacemaker Dependent?:  No. Date of Last Device Check:  12/05/2019       Normal Device Function?:  Yes.   Electrophysiologist's Recommendations:  Have magnet available.  Provide continuous ECG monitoring when magnet is used or reprogramming is to be performed.   Procedure should not interfere with device function.  No device programming or magnet placement needed.  Pfizer COVID-19 vaccine on 12/26/19. 01/09/20 presurgical COVID-19 test is pending.  Anesthesia team to evaluate on the day of surgery.    VS: BP 126/68   Pulse 68   Temp (!) 36.3 C (Oral)   Resp 18   Ht 5' 11.5" (1.816 m)   Wt 95.9 kg   SpO2 96%   BMI 29.09 kg/m    PROVIDERS: [name], [place] is primary cardiologist. Last evaluation 02/06/19 with Nada Boozer, NP.  Chrystie Nose, MD is Lipid cardiologist. Last evaluation  12/03/19.  Nanetta Batty, MD is [facility] cardiologist. Last evaluation 07/12/19.  Sherryl Manges, MD is EP cardiologist   LABS: Labs reviewed: Acceptable for surgery. (all labs ordered are listed, but only abnormal results are displayed)  Labs Reviewed  COMPREHENSIVE METABOLIC PANEL - Abnormal; Notable for the following components:      Result Value   Glucose, Bld 110 (*)    All other components within normal limits  SURGICAL PCR SCREEN  APTT  CBC  PROTIME-INR  URINALYSIS, ROUTINE W REFLEX MICROSCOPIC  TYPE AND SCREEN  ABO/RH     EKG: 03/02/19: Normal sinus rhythm Left axis deviation Inferior infarct, age undetermined Abnormal ECG    CV: Aortogram with BLE runoff 12/10/19: Findings: Aorta and iliac segments are free of flow-limiting stenosis.  Right side has multiple nonflow limiting stenosis in the SFA.  Popliteal with three-vessel runoff to the foot on the right.  Left side SFA is flush occluded reconstitutes really above-knee pop is healthy appearing at the below-knee popliteal artery with three-vessel runoff to the foot. - Patient will be scheduled for left femoral to popliteal artery bypass and vein mapping will be performed today   Echo 05/11/19: IMPRESSIONS  1. The left ventricle has normal systolic function, with an ejection  fraction of 55-60%. The cavity size was normal. There is mildly increased  left ventricular wall thickness. Left ventricular diastolic Doppler  parameters are consistent with impaired  relaxation.  2. The right ventricle has normal systolic function. The cavity was  normal.  3. The mitral valve is grossly normal.  4. The tricuspid valve is grossly normal.  5. The aortic  valve is tricuspid. Mild calcification of the aortic valve.  No stenosis of the aortic valve.  6. The aorta is normal unless otherwise noted.  7. Normal LV systolic function; mild LVH; mild diastolic dysfunction.  (Comparison: post-MI LVEF 30-35% 06/01/17; 60-65% 08/30/17)     Carotid US 01/31/19: Summary:  - Right Carotid: Velocities in the right ICA are consistent with a 60-79% stenosis.  - Left Carotid: Velocities in the left ICA are consistent with a 1-39% stenosis.  - Vertebrals: Bilateral vertebral arteries demonstrate antegrade flow.  - Subclavians: Normal flow hemodynamics were seen in bilateral subclavian arteries.  - Suggest follow up study in 12 months.    Cardiac cath 06/03/17:  Mid RCA lesion, 25 %stenosed.  Prox LAD to Mid LAD lesion, 20 %stenosed.  There is moderate left ventricular systolic dysfunction.  LV end diastolic pressure is normal.  The left ventricular ejection fraction is 35-45% by visual estimate. 1. Minor nonobstructive CAD 2. Moderate LV dysfunction. There a segmental wall motion abnormalities involving the mid ventricular segments and inferoapex. Consider Takotsubo syndrome. 3. Normal LVEDP. Plan: EP evaluation for consideration of ICD   Past Medical History:  Diagnosis Date  . AICD (automatic cardioverter/defibrillator) present   . Arthritis    "joints get sore at times" (08/28/2015)  . Cardiac arrest (HCC)    Vfib   . Carotid artery disease (HCC)   . Claudication of calf muscles (HCC)    left lower extremity  . Dyspnea   . Essential hypertension   . Hyperlipidemia   . Hypertension   . Myocardial infarction (HCC)   . PVD (peripheral vascular disease) (HCC)   . Smoking   . Systolic heart failure (HCC)    EF 30-35%  . Tobacco abuse     Past Surgical History:  Procedure Laterality Date  . ABDOMINAL AORTOGRAM W/LOWER EXTREMITY Bilateral 03/08/2019   Procedure: ABDOMINAL AORTOGRAM W/LOWER EXTREMITY;  Surgeon: Runell Gess, MD;  Location: [facility] INVASIVE CV LAB;  Service: Cardiovascular;  Laterality: Bilateral;  . ABDOMINAL AORTOGRAM W/LOWER EXTREMITY Bilateral 12/10/2019   Procedure: ABDOMINAL AORTOGRAM W/LOWER EXTREMITY;  Surgeon: Maeola Harman, MD;  Location: [facility] INVASIVE CV LAB;  Service:  Cardiovascular;  Laterality: Bilateral;  . ABDOMINAL SURGERY     No details available  . APPENDECTOMY    . COLON SURGERY    . EXPLORATORY LAPAROTOMY  1970's X 2   "part of small intestines; part of my bowels; appendix; 2nd OR was for adhesions"  . ICD IMPLANT N/A 06/06/2017   Procedure: ICD Implant;  Surgeon: Duke Salvia, MD;  Location: [facility] INVASIVE CV LAB;  Service: Cardiovascular;  Laterality: N/A;  . LEFT HEART CATH AND CORONARY ANGIOGRAPHY N/A 06/03/2017   Procedure: LEFT HEART CATH AND CORONARY ANGIOGRAPHY;  Surgeon: Swaz[place], Peter M, MD;  Location: [facility] INVASIVE CV LAB;  Service: Cardiovascular;  Laterality: N/A;  . PERIPHERAL VASCULAR CATHETERIZATION N/A 08/28/2015   Procedure: Lower Extremity Angiography;  Surgeon: Runell Gess, MD;  Location: [facility] INVASIVE CV LAB;  Service: Cardiovascular;  Laterality: N/A;  . TONSILLECTOMY      MEDICATIONS: . acetaminophen (TYLENOL 8 HOUR ARTHRITIS PAIN) 650 MG CR tablet  . aspirin EC 81 MG tablet  . atorvastatin (LIPITOR) 80 MG tablet  . benazepril (LOTENSIN) 40 MG tablet  . carvedilol (COREG) 3.125 MG tablet  . Coenzyme Q10 200 MG capsule  . VASCEPA 1 g capsule   No current facility-administered medications for this encounter.    Shonna Chock, PA-C Surgical Short Stay/Anesthesiology  [facility] Phone [phone] [facility] Phone [phone] 01/08/2020 4:09 PM

## 2020-01-09 ENCOUNTER — Other Ambulatory Visit (HOSPITAL_COMMUNITY)
Admission: RE | Admit: 2020-01-09 | Discharge: 2020-01-09 | Disposition: A | Discharge status: Home / Self Care | Payer: Medicare Other | Source: Ambulatory Visit | Attending: Vascular Surgery | Admitting: Vascular Surgery

## 2020-01-09 LAB — SARS CORONAVIRUS 2 (TAT 6-24 HRS): SARS Coronavirus 2: NEGATIVE

## 2020-01-10 ENCOUNTER — Inpatient Hospital Stay (HOSPITAL_COMMUNITY): Payer: Medicare Other | Admitting: Vascular Surgery

## 2020-01-10 ENCOUNTER — Other Ambulatory Visit: Payer: Self-pay

## 2020-01-10 ENCOUNTER — Inpatient Hospital Stay (HOSPITAL_COMMUNITY): Payer: Medicare Other

## 2020-01-10 ENCOUNTER — Encounter (HOSPITAL_COMMUNITY): Payer: Self-pay | Admitting: Vascular Surgery

## 2020-01-10 ENCOUNTER — Encounter (HOSPITAL_COMMUNITY)
Admission: RE | Disposition: A | Discharge status: Home / Self Care | Payer: Self-pay | Source: Home / Self Care | Attending: Vascular Surgery

## 2020-01-10 ENCOUNTER — Inpatient Hospital Stay (HOSPITAL_COMMUNITY)
Admission: RE | Admit: 2020-01-10 | Discharge: 2020-01-12 | DRG: 253 | Disposition: A | Discharge status: Home / Self Care | Payer: Medicare Other | Attending: Vascular Surgery | Admitting: Vascular Surgery

## 2020-01-10 DIAGNOSIS — Z20822 Contact with and (suspected) exposure to covid-19: Secondary | ICD-10-CM | POA: Diagnosis not present

## 2020-01-10 DIAGNOSIS — I70212 Atherosclerosis of native arteries of extremities with intermittent claudication, left leg: Principal | ICD-10-CM | POA: Diagnosis present

## 2020-01-10 DIAGNOSIS — E785 Hyperlipidemia, unspecified: Secondary | ICD-10-CM | POA: Diagnosis present

## 2020-01-10 DIAGNOSIS — Z8674 Personal history of sudden cardiac arrest: Secondary | ICD-10-CM

## 2020-01-10 DIAGNOSIS — I739 Peripheral vascular disease, unspecified: Secondary | ICD-10-CM | POA: Diagnosis present

## 2020-01-10 DIAGNOSIS — Z79899 Other long term (current) drug therapy: Secondary | ICD-10-CM

## 2020-01-10 DIAGNOSIS — Z7982 Long term (current) use of aspirin: Secondary | ICD-10-CM | POA: Diagnosis not present

## 2020-01-10 DIAGNOSIS — I252 Old myocardial infarction: Secondary | ICD-10-CM

## 2020-01-10 DIAGNOSIS — I5020 Unspecified systolic (congestive) heart failure: Secondary | ICD-10-CM | POA: Diagnosis present

## 2020-01-10 DIAGNOSIS — I70222 Atherosclerosis of native arteries of extremities with rest pain, left leg: Secondary | ICD-10-CM | POA: Diagnosis not present

## 2020-01-10 DIAGNOSIS — Z87891 Personal history of nicotine dependence: Secondary | ICD-10-CM | POA: Diagnosis not present

## 2020-01-10 DIAGNOSIS — I110 Hypertensive heart disease with heart failure: Secondary | ICD-10-CM | POA: Diagnosis not present

## 2020-01-10 DIAGNOSIS — Z95810 Presence of automatic (implantable) cardiac defibrillator: Secondary | ICD-10-CM

## 2020-01-10 DIAGNOSIS — I5021 Acute systolic (congestive) heart failure: Secondary | ICD-10-CM | POA: Diagnosis not present

## 2020-01-10 HISTORY — PX: FEMORAL-POPLITEAL BYPASS GRAFT: SHX937

## 2020-01-10 HISTORY — PX: PATCH ANGIOPLASTY: SHX6230

## 2020-01-10 LAB — CREATININE, SERUM
Creatinine, Ser: 0.85 mg/dL (ref 0.61–1.24)
GFR calc Af Amer: >60 mL/min (ref >60)
GFR calc non Af Amer: >60 mL/min (ref >60)

## 2020-01-10 LAB — CBC
HCT: 38.8 % — ABNORMAL LOW (ref 39.0–52.0)
Hemoglobin: 13.4 g/dL (ref 13.0–17.0)
MCH: 31.1 pg (ref 26.0–34.0)
MCHC: 34.5 g/dL (ref 30.0–36.0)
MCV: 90 fL (ref 80.0–100.0)
Platelets: 208 10*3/uL (ref 150–400)
RBC: 4.31 MIL/uL (ref 4.22–5.81)
RDW: 12.8 % (ref 11.5–15.5)
WBC: 12.1 10*3/uL — ABNORMAL HIGH (ref 4.0–10.5)
nRBC: 0 % (ref 0.0–0.2)

## 2020-01-10 SURGERY — BYPASS GRAFT FEMORAL-POPLITEAL ARTERY
Anesthesia: General | Laterality: Left

## 2020-01-10 MED ORDER — SODIUM CHLORIDE 0.9 % IV SOLN: 500.0000 mL | Freq: Once | INTRAVENOUS | Status: DC | PRN

## 2020-01-10 MED ORDER — MAGNESIUM SULFATE 2 GM/50ML IV SOLN: 2.0000 g | Freq: Every day | INTRAVENOUS | Status: DC | PRN

## 2020-01-10 MED ORDER — PHENOL 1.4 % MT LIQD: 1.0000 | OROMUCOSAL | Status: DC | PRN

## 2020-01-10 MED ORDER — SODIUM CHLORIDE 0.9 % IV SOLN
INTRAVENOUS | Status: AC
  Filled 2020-01-10: qty 1.2

## 2020-01-10 MED ORDER — OXYCODONE HCL 5 MG/5ML PO SOLN: 5.0000 mg | Freq: Once | ORAL | Status: DC | PRN

## 2020-01-10 MED ORDER — CARVEDILOL 3.125 MG PO TABS
3.1250 mg | ORAL_TABLET | Freq: Two times a day (BID) | ORAL | Status: DC
  Administered 2020-01-10 – 2020-01-12 (×4): 3.125 mg via ORAL
  Filled 2020-01-10 (×4): qty 1

## 2020-01-10 MED ORDER — PHENYLEPHRINE HCL-NACL 10-0.9 MG/250ML-% IV SOLN
INTRAVENOUS | Status: DC | PRN
  Administered 2020-01-10: 40 ug/min via INTRAVENOUS

## 2020-01-10 MED ORDER — HEPARIN SODIUM (PORCINE) 1000 UNIT/ML IJ SOLN
INTRAMUSCULAR | Status: DC | PRN
  Administered 2020-01-10: 10000 [IU] via INTRAVENOUS
  Administered 2020-01-10: 5000 [IU] via INTRAVENOUS

## 2020-01-10 MED ORDER — LACTATED RINGERS IV SOLN: INTRAVENOUS | Status: DC | PRN

## 2020-01-10 MED ORDER — HYDRALAZINE HCL 20 MG/ML IJ SOLN: 5.0000 mg | INTRAMUSCULAR | Status: DC | PRN

## 2020-01-10 MED ORDER — CEFAZOLIN SODIUM-DEXTROSE 2-4 GM/100ML-% IV SOLN
2.0000 g | Freq: Three times a day (TID) | INTRAVENOUS | Status: AC
  Administered 2020-01-10 (×2): 2 g via INTRAVENOUS
  Filled 2020-01-10 (×2): qty 100

## 2020-01-10 MED ORDER — EPHEDRINE SULFATE-NACL 50-0.9 MG/10ML-% IV SOSY
PREFILLED_SYRINGE | INTRAVENOUS | Status: DC | PRN
  Administered 2020-01-10: 5 mg via INTRAVENOUS
  Administered 2020-01-10: 10 mg via INTRAVENOUS
  Administered 2020-01-10: 5 mg via INTRAVENOUS
  Administered 2020-01-10: 10 mg via INTRAVENOUS

## 2020-01-10 MED ORDER — ONDANSETRON HCL 4 MG/2ML IJ SOLN
INTRAMUSCULAR | Status: AC
  Filled 2020-01-10: qty 2

## 2020-01-10 MED ORDER — SUGAMMADEX SODIUM 200 MG/2ML IV SOLN
INTRAVENOUS | Status: DC | PRN
  Administered 2020-01-10: 200 mg via INTRAVENOUS

## 2020-01-10 MED ORDER — PHENYLEPHRINE 40 MCG/ML (10ML) SYRINGE FOR IV PUSH (FOR BLOOD PRESSURE SUPPORT)
PREFILLED_SYRINGE | INTRAVENOUS | Status: AC
  Filled 2020-01-10: qty 10

## 2020-01-10 MED ORDER — ONDANSETRON HCL 4 MG/2ML IJ SOLN: 4.0000 mg | Freq: Four times a day (QID) | INTRAMUSCULAR | Status: DC | PRN

## 2020-01-10 MED ORDER — MIDAZOLAM HCL 2 MG/2ML IJ SOLN
INTRAMUSCULAR | Status: AC
  Filled 2020-01-10: qty 2

## 2020-01-10 MED ORDER — SODIUM CHLORIDE 0.9 % IV SOLN
INTRAVENOUS | Status: DC | PRN
  Administered 2020-01-10: 500 mL

## 2020-01-10 MED ORDER — PROPOFOL 10 MG/ML IV BOLUS
INTRAVENOUS | Status: DC | PRN
  Administered 2020-01-10: 130 mg via INTRAVENOUS

## 2020-01-10 MED ORDER — DEXAMETHASONE SODIUM PHOSPHATE 10 MG/ML IJ SOLN
INTRAMUSCULAR | Status: AC
  Filled 2020-01-10: qty 1

## 2020-01-10 MED ORDER — HYDROMORPHONE HCL 1 MG/ML IJ SOLN
0.2500 mg | INTRAMUSCULAR | Status: DC | PRN
  Administered 2020-01-10 (×2): 0.5 mg via INTRAVENOUS

## 2020-01-10 MED ORDER — POTASSIUM CHLORIDE CRYS ER 20 MEQ PO TBCR: 20.0000 meq | EXTENDED_RELEASE_TABLET | Freq: Every day | ORAL | Status: DC | PRN

## 2020-01-10 MED ORDER — CEFAZOLIN SODIUM-DEXTROSE 2-3 GM-%(50ML) IV SOLR
INTRAVENOUS | Status: DC | PRN
  Administered 2020-01-10: 2 g via INTRAVENOUS

## 2020-01-10 MED ORDER — PROPOFOL 10 MG/ML IV BOLUS
INTRAVENOUS | Status: AC
  Filled 2020-01-10: qty 20

## 2020-01-10 MED ORDER — SUCCINYLCHOLINE CHLORIDE 200 MG/10ML IV SOSY
PREFILLED_SYRINGE | INTRAVENOUS | Status: AC
  Filled 2020-01-10: qty 10

## 2020-01-10 MED ORDER — PANTOPRAZOLE SODIUM 40 MG PO TBEC
40.0000 mg | DELAYED_RELEASE_TABLET | Freq: Every day | ORAL | Status: DC
  Administered 2020-01-10 – 2020-01-12 (×3): 40 mg via ORAL
  Filled 2020-01-10 (×3): qty 1

## 2020-01-10 MED ORDER — ACETAMINOPHEN 500 MG PO TABS
1000.0000 mg | ORAL_TABLET | Freq: Once | ORAL | Status: AC
  Administered 2020-01-10: 1000 mg via ORAL
  Filled 2020-01-10: qty 2

## 2020-01-10 MED ORDER — HEPARIN SODIUM (PORCINE) 1000 UNIT/ML IJ SOLN
INTRAMUSCULAR | Status: AC
  Filled 2020-01-10: qty 1

## 2020-01-10 MED ORDER — PROTAMINE SULFATE 10 MG/ML IV SOLN
INTRAVENOUS | Status: DC | PRN
  Administered 2020-01-10: 50 mg via INTRAVENOUS

## 2020-01-10 MED ORDER — FENTANYL CITRATE (PF) 250 MCG/5ML IJ SOLN
INTRAMUSCULAR | Status: DC | PRN
  Administered 2020-01-10: 100 ug via INTRAVENOUS
  Administered 2020-01-10 (×4): 50 ug via INTRAVENOUS

## 2020-01-10 MED ORDER — OXYCODONE-ACETAMINOPHEN 5-325 MG PO TABS
1.0000 | ORAL_TABLET | ORAL | Status: DC | PRN
  Administered 2020-01-11 – 2020-01-12 (×3): 2 via ORAL
  Filled 2020-01-10 (×3): qty 2

## 2020-01-10 MED ORDER — FENTANYL CITRATE (PF) 250 MCG/5ML IJ SOLN
INTRAMUSCULAR | Status: AC
  Filled 2020-01-10: qty 5

## 2020-01-10 MED ORDER — CEFAZOLIN SODIUM-DEXTROSE 2-4 GM/100ML-% IV SOLN
2.0000 g | INTRAVENOUS | Status: DC
  Filled 2020-01-10: qty 100

## 2020-01-10 MED ORDER — ALUM & MAG HYDROXIDE-SIMETH 200-200-20 MG/5ML PO SUSP: 15.0000 mL | ORAL | Status: DC | PRN

## 2020-01-10 MED ORDER — ONDANSETRON HCL 4 MG/2ML IJ SOLN
INTRAMUSCULAR | Status: DC | PRN
  Administered 2020-01-10: 4 mg via INTRAVENOUS

## 2020-01-10 MED ORDER — ROCURONIUM BROMIDE 10 MG/ML (PF) SYRINGE
PREFILLED_SYRINGE | INTRAVENOUS | Status: AC
  Filled 2020-01-10: qty 10

## 2020-01-10 MED ORDER — METOPROLOL TARTRATE 5 MG/5ML IV SOLN: 2.0000 mg | INTRAVENOUS | Status: DC | PRN

## 2020-01-10 MED ORDER — MIDAZOLAM HCL 2 MG/2ML IJ SOLN
INTRAMUSCULAR | Status: DC | PRN
  Administered 2020-01-10: 2 mg via INTRAVENOUS

## 2020-01-10 MED ORDER — DEXAMETHASONE SODIUM PHOSPHATE 10 MG/ML IJ SOLN
INTRAMUSCULAR | Status: DC | PRN
  Administered 2020-01-10: 10 mg via INTRAVENOUS

## 2020-01-10 MED ORDER — HEPARIN SODIUM (PORCINE) 5000 UNIT/ML IJ SOLN
5000.0000 [IU] | Freq: Three times a day (TID) | INTRAMUSCULAR | Status: DC
  Administered 2020-01-11 – 2020-01-12 (×4): 5000 [IU] via SUBCUTANEOUS
  Filled 2020-01-10 (×4): qty 1

## 2020-01-10 MED ORDER — ACETAMINOPHEN 325 MG PO TABS
325.0000 mg | ORAL_TABLET | ORAL | Status: DC | PRN
  Administered 2020-01-10 – 2020-01-11 (×2): 650 mg via ORAL
  Filled 2020-01-10 (×2): qty 2

## 2020-01-10 MED ORDER — ROCURONIUM BROMIDE 100 MG/10ML IV SOLN
INTRAVENOUS | Status: DC | PRN
  Administered 2020-01-10: 20 mg via INTRAVENOUS
  Administered 2020-01-10: 60 mg via INTRAVENOUS
  Administered 2020-01-10: 20 mg via INTRAVENOUS

## 2020-01-10 MED ORDER — COENZYME Q10 200 MG PO CAPS: 200.0000 mg | ORAL_CAPSULE | Freq: Every day | ORAL | Status: DC

## 2020-01-10 MED ORDER — LIDOCAINE 2% (20 MG/ML) 5 ML SYRINGE
INTRAMUSCULAR | Status: AC
  Filled 2020-01-10: qty 5

## 2020-01-10 MED ORDER — BENAZEPRIL HCL 5 MG PO TABS
20.0000 mg | ORAL_TABLET | Freq: Every day | ORAL | Status: DC
  Administered 2020-01-10 – 2020-01-12 (×3): 20 mg via ORAL
  Filled 2020-01-10 (×3): qty 4

## 2020-01-10 MED ORDER — LABETALOL HCL 5 MG/ML IV SOLN: 10.0000 mg | INTRAVENOUS | Status: DC | PRN

## 2020-01-10 MED ORDER — DOCUSATE SODIUM 100 MG PO CAPS
100.0000 mg | ORAL_CAPSULE | Freq: Every day | ORAL | Status: DC
  Administered 2020-01-11 – 2020-01-12 (×2): 100 mg via ORAL
  Filled 2020-01-10 (×2): qty 1

## 2020-01-10 MED ORDER — 0.9 % SODIUM CHLORIDE (POUR BTL) OPTIME
TOPICAL | Status: DC | PRN
  Administered 2020-01-10: 2000 mL

## 2020-01-10 MED ORDER — ACETAMINOPHEN 325 MG RE SUPP: 325.0000 mg | RECTAL | Status: DC | PRN

## 2020-01-10 MED ORDER — GUAIFENESIN-DM 100-10 MG/5ML PO SYRP: 15.0000 mL | ORAL_SOLUTION | ORAL | Status: DC | PRN

## 2020-01-10 MED ORDER — ONDANSETRON HCL 4 MG/2ML IJ SOLN: 4.0000 mg | Freq: Once | INTRAMUSCULAR | Status: DC | PRN

## 2020-01-10 MED ORDER — ASPIRIN EC 81 MG PO TBEC
81.0000 mg | DELAYED_RELEASE_TABLET | Freq: Every day | ORAL | Status: DC
  Administered 2020-01-10 – 2020-01-12 (×3): 81 mg via ORAL
  Filled 2020-01-10 (×3): qty 1

## 2020-01-10 MED ORDER — SODIUM CHLORIDE 0.9 % IV SOLN: INTRAVENOUS | Status: DC

## 2020-01-10 MED ORDER — LIDOCAINE HCL (CARDIAC) PF 100 MG/5ML IV SOSY
PREFILLED_SYRINGE | INTRAVENOUS | Status: DC | PRN
  Administered 2020-01-10: 40 mg via INTRATRACHEAL

## 2020-01-10 MED ORDER — MORPHINE SULFATE (PF) 2 MG/ML IV SOLN: 2.0000 mg | INTRAVENOUS | Status: DC | PRN

## 2020-01-10 MED ORDER — HEMOSTATIC AGENTS (NO CHARGE) OPTIME
TOPICAL | Status: DC | PRN
  Administered 2020-01-10: 1 via TOPICAL

## 2020-01-10 MED ORDER — HYDROMORPHONE HCL 1 MG/ML IJ SOLN
INTRAMUSCULAR | Status: AC
  Filled 2020-01-10: qty 1

## 2020-01-10 MED ORDER — PROTAMINE SULFATE 10 MG/ML IV SOLN
INTRAVENOUS | Status: AC
  Filled 2020-01-10: qty 25

## 2020-01-10 MED ORDER — ATORVASTATIN CALCIUM 80 MG PO TABS
80.0000 mg | ORAL_TABLET | Freq: Every day | ORAL | Status: DC
  Administered 2020-01-10 – 2020-01-12 (×3): 80 mg via ORAL
  Filled 2020-01-10 (×3): qty 1

## 2020-01-10 MED ORDER — OXYCODONE HCL 5 MG PO TABS: 5.0000 mg | ORAL_TABLET | Freq: Once | ORAL | Status: DC | PRN

## 2020-01-10 MED ORDER — EPHEDRINE 5 MG/ML INJ
INTRAVENOUS | Status: AC
  Filled 2020-01-10: qty 10

## 2020-01-10 SURGICAL SUPPLY — 61 items
ADH SKN CLS APL DERMABOND .7 (GAUZE/BANDAGES/DRESSINGS) ×2
BANDAGE ESMARK 6X9 LF (GAUZE/BANDAGES/DRESSINGS) IMPLANT
BNDG CMPR 9X6 STRL LF SNTH (GAUZE/BANDAGES/DRESSINGS)
BNDG ESMARK 6X9 LF (GAUZE/BANDAGES/DRESSINGS)
CANISTER SUCT 3000ML PPV (MISCELLANEOUS) ×3 IMPLANT
CLIP VESOCCLUDE MED 24/CT (CLIP) ×3 IMPLANT
CLIP VESOCCLUDE SM WIDE 24/CT (CLIP) ×3 IMPLANT
COVER PROBE W GEL 5X96 (DRAPES) ×2 IMPLANT
CUFF TOURN SGL QUICK 24 (TOURNIQUET CUFF)
CUFF TOURN SGL QUICK 34 (TOURNIQUET CUFF)
CUFF TOURN SGL QUICK 42 (TOURNIQUET CUFF) IMPLANT
CUFF TRNQT CYL 24X4X16.5-23 (TOURNIQUET CUFF) IMPLANT
CUFF TRNQT CYL 34X4.125X (TOURNIQUET CUFF) IMPLANT
DERMABOND ADVANCED (GAUZE/BANDAGES/DRESSINGS) ×4
DERMABOND ADVANCED .7 DNX12 (GAUZE/BANDAGES/DRESSINGS) ×1 IMPLANT
DRAIN CHANNEL 15F RND FF W/TCR (WOUND CARE) IMPLANT
DRAPE C-ARM 42X72 X-RAY (DRAPES) IMPLANT
DRAPE HALF SHEET 40X57 (DRAPES) IMPLANT
DRAPE X-RAY CASS 24X20 (DRAPES) IMPLANT
ELECT REM PT RETURN 9FT ADLT (ELECTROSURGICAL) ×3
ELECTRODE REM PT RTRN 9FT ADLT (ELECTROSURGICAL) ×1 IMPLANT
EVACUATOR SILICONE 100CC (DRAIN) IMPLANT
GLOVE BIO SURGEON STRL SZ 6.5 (GLOVE) ×4 IMPLANT
GLOVE BIO SURGEON STRL SZ7.5 (GLOVE) ×3 IMPLANT
GLOVE BIO SURGEONS STRL SZ 6.5 (GLOVE) ×4
GLOVE BIOGEL PI IND STRL 6.5 (GLOVE) IMPLANT
GLOVE BIOGEL PI IND STRL 7.0 (GLOVE) IMPLANT
GLOVE BIOGEL PI INDICATOR 6.5 (GLOVE) ×12
GLOVE BIOGEL PI INDICATOR 7.0 (GLOVE) ×2
GOWN STRL REUS W/ TWL LRG LVL3 (GOWN DISPOSABLE) ×2 IMPLANT
GOWN STRL REUS W/ TWL XL LVL3 (GOWN DISPOSABLE) ×1 IMPLANT
GOWN STRL REUS W/TWL LRG LVL3 (GOWN DISPOSABLE) ×18
GOWN STRL REUS W/TWL XL LVL3 (GOWN DISPOSABLE) ×3
GRAFT PROPATEN W/RING 6X80X60 (Vascular Products) ×2 IMPLANT
HEMOSTAT SNOW SURGICEL 2X4 (HEMOSTASIS) ×2 IMPLANT
INSERT FOGARTY SM (MISCELLANEOUS) ×2 IMPLANT
KIT BASIN OR (CUSTOM PROCEDURE TRAY) ×3 IMPLANT
KIT TURNOVER KIT B (KITS) ×3 IMPLANT
MARKER GRAFT CORONARY BYPASS (MISCELLANEOUS) IMPLANT
NS IRRIG 1000ML POUR BTL (IV SOLUTION) ×6 IMPLANT
PACK PERIPHERAL VASCULAR (CUSTOM PROCEDURE TRAY) ×3 IMPLANT
PAD ARMBOARD 7.5X6 YLW CONV (MISCELLANEOUS) ×6 IMPLANT
PATCH HEMASHIELD 8X75 (Vascular Products) ×2 IMPLANT
SET COLLECT BLD 21X3/4 12 (NEEDLE) IMPLANT
SPONGE LAP 18X18 RF (DISPOSABLE) ×2 IMPLANT
STOPCOCK 4 WAY LG BORE MALE ST (IV SETS) IMPLANT
SUT MNCRL AB 4-0 PS2 18 (SUTURE) ×6 IMPLANT
SUT PROLENE 5 0 C 1 24 (SUTURE) ×13 IMPLANT
SUT PROLENE 6 0 BV (SUTURE) ×3 IMPLANT
SUT PROLENE 7 0 BV 1 (SUTURE) IMPLANT
SUT SILK 2 0 SH (SUTURE) ×3 IMPLANT
SUT SILK 3 0 (SUTURE)
SUT SILK 3-0 18XBRD TIE 12 (SUTURE) IMPLANT
SUT VIC AB 2-0 SH 27 (SUTURE) ×3
SUT VIC AB 2-0 SH 27XBRD (SUTURE) IMPLANT
SUT VIC AB 3-0 SH 27 (SUTURE) ×6
SUT VIC AB 3-0 SH 27X BRD (SUTURE) ×2 IMPLANT
TOWEL GREEN STERILE (TOWEL DISPOSABLE) ×3 IMPLANT
TRAY FOLEY MTR SLVR 16FR STAT (SET/KITS/TRAYS/PACK) ×3 IMPLANT
UNDERPAD 30X30 (UNDERPADS AND DIAPERS) ×3 IMPLANT
WATER STERILE IRR 1000ML POUR (IV SOLUTION) ×3 IMPLANT

## 2020-01-10 NOTE — H&P (Signed)
HPI:  Rick Logan is a 64 y.o. male previous history of claudication intervened with atherectomy in 02-03-15. He sustained sudden cardiac death Feb 02, 2017 now has a defibrillator. He has quit smoking since then does take aspirin and a statin drug. Does have short distance claudication approximately 50 yards worse when he is walking up hills. He did have a repeat angiography but no intervention was undertaken. Risk factors for vascular disease include history of former smoking, hypertension hyperlipidemia. Denies rest pain or tissue loss.      Past Medical History:  Diagnosis Date  . Arthritis    "joints get sore at times" (08/28/2015)  . Cardiac arrest (HCC)    Vfib   . Claudication of calf muscles (HCC)    left lower extremity  . Essential hypertension   . Hyperlipidemia   . Hypertension   . PVD (peripheral vascular disease) (HCC)   . Smoking   . Systolic heart failure (HCC)    EF 30-35%  . Tobacco abuse         Family History  Problem Relation Age of Onset  . Cancer Father         Past Surgical History:  Procedure Laterality Date  . ABDOMINAL AORTOGRAM W/LOWER EXTREMITY Bilateral 03/08/2019   Procedure: ABDOMINAL AORTOGRAM W/LOWER EXTREMITY; Surgeon: Runell Gess, MD; Location: [facility] INVASIVE CV LAB; Service: Cardiovascular; Laterality: Bilateral;  . ABDOMINAL SURGERY     No details available  . APPENDECTOMY    . COLON SURGERY    . EXPLORATORY LAPAROTOMY  1970's X 2   "part of small intestines; part of my bowels; appendix; 2nd OR was for adhesions"  . ICD IMPLANT N/A 06/06/2017   Procedure: ICD Implant; Surgeon: Duke Salvia, MD; Location: [facility] INVASIVE CV LAB; Service: Cardiovascular; Laterality: N/A;  . LEFT HEART CATH AND CORONARY ANGIOGRAPHY N/A 06/03/2017   Procedure: LEFT HEART CATH AND CORONARY ANGIOGRAPHY; Surgeon: Swaziland, Peter M, MD; Location: [facility] INVASIVE CV LAB; Service: Cardiovascular; Laterality: N/A;  . PERIPHERAL VASCULAR CATHETERIZATION N/A 08/28/2015   Procedure: Lower Extremity Angiography; Surgeon: Runell Gess, MD; Location: [facility] INVASIVE CV LAB; Service: Cardiovascular; Laterality: N/A;  . TONSILLECTOMY     Short Social History:  Social History        Tobacco Use  . Smoking status: Former Smoker    Packs/day: 1.00    Years: 41.00    Pack years: 41.00    Types: Cigarettes    Quit date: 06/01/2017    Years since quitting: 2.4  . Smokeless tobacco: Never Used  Substance Use Topics  . Alcohol use: No   No Known Allergies        Current Outpatient Medications  Medication Sig Dispense Refill  . acetaminophen (TYLENOL 8 HOUR ARTHRITIS PAIN) 650 MG CR tablet Take 650 mg by mouth 2 (two) times daily as needed (pain.).     [name] aspirin EC 81 MG tablet Take 81 mg by mouth daily.    . benazepril (LOTENSIN) 40 MG tablet Take 0.5 tablets (20 mg total) by mouth at bedtime. (Patient taking differently: Take 20 mg by mouth daily. ) 45 tablet 3  . carvedilol (COREG) 3.125 MG tablet TAKE 1 TABLET BY MOUTH TWICE A DAY WITH A MEAL (Patient taking differently: Take 3.125 mg by mouth 2 (two) times a day. ) 180 tablet 3  . Coenzyme Q10 200 MG capsule Take 1 capsule (200 mg total) by mouth daily. 90 capsule 3  . fenofibrate (TRICOR) 145 MG tablet  Take 1 tablet (145 mg total) by mouth daily. 90 tablet 3  . omeprazole (PRILOSEC) 40 MG capsule Take 40 mg by mouth 2 (two) times a day.    [name] atorvastatin (LIPITOR) 80 MG tablet Take 1 tablet (80 mg total) by mouth daily. (Patient not taking: Reported on 11/30/2019) 90 tablet 3  . famotidine (PEPCID) 20 MG tablet Take 20 mg by mouth every evening.     . meclizine (ANTIVERT) 25 MG tablet Take 25 mg by mouth 3 (three) times daily as needed.     No current facility-administered medications for this visit.   Review of Systems  Constitutional: Constitutional negative.  HENT: HENT negative.  Eyes: Eyes negative.  Respiratory: Positive for shortness of breath.  Cardiovascular: Positive for claudication.  GI:  Gastrointestinal negative.  Musculoskeletal:  Right groin pain  Skin: Skin negative.  Neurological: Neurological negative.  Hematologic: Hematologic/lymphatic negative.  Psychiatric: Psychiatric negative.   Objective:   Vitals:   01/10/20 0600  BP: (!) 160/78  Pulse: (!) 58  Resp: 18  Temp: 98.1 F (36.7 C)  SpO2: 98%    Body mass index is 28.67 kg/m.  Physical Exam  HENT:  Nose:  Comments: Mask in place Eyes:  Pupils: Pupils are equal, round, and reactive to light.  Cardiovascular:  Rate and Rhythm: Normal rate.  Pulses:  Femoral pulses are 2+ on the right side and 2+ on the left side. Pulmonary:  Effort: Pulmonary effort is normal.  Abdominal:  General: Abdomen is flat.  Palpations: Abdomen is soft. There is no mass.  Musculoskeletal:  Cervical back: Neck supple.  Skin:  General: Skin is dry.  Neurological:  Mental Status: He is alert.  Psychiatric:  Mood and Affect: Mood normal.  Behavior: Behavior normal.  Thought Content: Thought content normal.  Judgment: Judgment normal.   Data:  No studies today. Previous ABI 1 on the right and 0.74 on the left and monophasic  I have reviewed his previous angiography with the patient which demonstrates occluded left SFA.    Assessment/Plan:   Left femoral to popliteal artery bypass today in OR.   Khristin Keleher C. Donzetta Matters, MD [facility] [place] Office: [phone] Pager: [phone]

## 2020-01-10 NOTE — Op Note (Signed)
Patient name: Rick Logan MRN: [id number] DOB: 1956/03/14 Sex: male  01/10/2020 Pre-operative Diagnosis: left lower extremity life limiting claudication Post-operative diagnosis:  Same Surgeon:  Eda Paschal. Donzetta Matters, MD Assistants: Leontine Locket, PA; Alma Downs, medical student Procedure Performed: 1.  Left common femoral and superficial femoral endarterectomy with Dacron patch angioplasty 2.  Left common femoral to above-knee popliteal artery bypass with 6 mm ringed PTFE   Indications: 64 year old male with history of life limiting left lower extremity claudication.  He has quit smoking.  He does take aspirin and a statin.  He has undergone angiography which demonstrates flush occlusion of his SFA.  He reconstitutes above the knee popliteal artery.  He has runoff via 2 vessels which appear to be the peroneal and posterior tibial artery.  Findings: Common femoral artery was heavily calcified and subtotally occluded although this was much different than the previous angiography.  Femoral endarterectomy was performed up into the external iliac artery.  We also performed extensive superficial femoral artery pulling as much calcified plaque as we could to get good backbleeding.  There was brisk flow returning from the profunda after endarterectomy with profundoplasty.  We performed patch angioplasty of this.  There was very strong inflow and good signal in the profunda artery is runoff.  We then performed bypass to the above-knee popliteal artery.  This was somewhat calcified but clamped able.  We also perform limited endarterectomy of the above-knee popliteal artery.  At completion there was good signal distally in the posterior tibial and peroneal arteries these were graft dependent.  If the bypass fails patient could have conversion to below-knee popliteal bypass or even possible endovascular options now that his SFA is not flush occluded.   Procedure:  The patient was identified in the holding  area and taken to the operating where is placed supine operative table and general anesthesia induced.  He was sterilely prepped and draped in the left groin in the usual fashion antibiotics were ministered a timeout was called.  We began using ultrasound to identify his greater saphenous vein.  This was very diminutive even in the high thigh and distally was very small less than 2 mm.  I elected not to harvest this vein.  A transverse incision was made in the groin overlying the common femoral bifurcation where we dissected down to the inguinal ligament.  Under the inguinal ligament we identified the external leg artery and placed a vessel loop around this.  We had medial lateral circumflex branches we also placed Vesseloops around.  We dissected out the profunda and SFA.  The SFA was heavily calcified.  We turned our attention distally.  Above the knee we made a longitudinal incision.  We dissected down to the popliteal space.  1 popliteal vein was ligated for exposure.  The above-knee popliteal artery was somewhat calcified but there were areas for clamping.  We checked with Doppler and it had good monophasic signal in it.  A tunnel was created between the 2 incisions and the patient was fully heparinized.  We began by clamping the profunda followed by the external leg artery.  We opened the vessel longitudinally.  It was heavily calcified and appear to be subtotally occluded.  We performed extensive endarterectomy standing up in the external leg artery and extensive endarterectomy into the SFA.  We had good backbleeding from the SFA and nearly pulsatile backbleeding from the profunda.  We prepared a dacryon patch and sewed this in place.  Prior to completion  we flushed with heparinized saline.  We then released our clamps we are to place to repair stitches.  We then turned our attention to the graft portion of the case.  We tunneled a 6 mm PTFE ringed graft.  We again clamped our external SFA and now profunda  femoris arteries.  We open the dacryon longitudinally.  We trimmed the PTFE graft to size and sewed it end-to-side to the dacryon.  Upon completion we released our clamps.  We flushed through the PTFE.  We turned our attention distally.  The popliteal artery was clamped distally and proximally opened longitudinally.  There we again encountered significant plaque.  Limited endarterectomy was performed.  We had good backbleeding and actually very good antegrade bleeding at this time.  We straighten the leg and trimmed the PTFE graft to size.  We then sewed end-to-side with 6-0 Prolene suture.  Prior to completion we allowed flushing all directions.  Upon completion there was very good signal distally in the wound bed.  We also had posterior tibial and peroneal artery signals that were nonexistent with compression of the graft.  Satisfied with this we administered 50 mg of protamine.  Hemostasis was obtained.  We thoroughly irrigated the wounds and closed in layers of Vicryl and Monocryl.  He was awake from anesthesia having tolerated procedure without immediate complication.  Counts were correct at completion.  EBL: 200 cc    Zenas Santa C. Randie Heinz, MD [facility] [place] Office: [phone] Pager: [phone]

## 2020-01-10 NOTE — Plan of Care (Signed)
Continue to monitor

## 2020-01-10 NOTE — Progress Notes (Signed)
PHARMACIST - PHYSICIAN ORDER COMMUNICATION  CONCERNING: P&T Medication Policy on Herbal Medications  DESCRIPTION:  This patient's order for:  Coenzyme Q 10  has been noted.  This product(s) is classified as an "herbal" or natural product. Due to a lack of definitive safety studies or FDA approval, nonstandard manufacturing practices, plus the potential risk of unknown drug-drug interactions while on inpatient medications, the Pharmacy and Therapeutics Committee does not permit the use of "herbal" or natural products of this type within [place].   ACTION TAKEN: The pharmacy department is unable to verify this order at this time and your patient has been informed of this safety policy. Please reevaluate patient's clinical condition at discharge and address if the herbal or natural product(s) should be resumed at that time.  Diane Adylynn Hertenstein, PharmD, BCPS, FCCP Clinical Pharmacist 

## 2020-01-10 NOTE — Transfer of Care (Signed)
Immediate Anesthesia Transfer of Care Note  Patient: Rick Logan  Procedure(s) Performed: BYPASS GRAFT FEMORAL-POPLITEAL ARTERY USING GORE PROPATEN VASCULAR GRAFT (Left ) PATCH ANGIOPLASTY USING HEMASHIELD PLATINUM FINESSE PATCH (Left )  Patient Location: PACU  Anesthesia Type:General  Level of Consciousness: drowsy and patient cooperative  Airway & Oxygen Therapy: Patient Spontanous Breathing  Post-op Assessment: Report given to RN and Post -op Vital signs reviewed and stable  Post vital signs: Reviewed and stable  Last Vitals:  Vitals Value Taken Time  BP 151/79 01/10/20 1036  Temp    Pulse 73 01/10/20 1037  Resp 12 01/10/20 1037  SpO2 93 % 01/10/20 1037  Vitals shown include unvalidated device data.  Last Pain:  Vitals:   01/10/20 0614  TempSrc:   PainSc: 0-No pain         Complications: No apparent anesthesia complications

## 2020-01-10 NOTE — Progress Notes (Signed)
  Day of Surgery Note    Subjective:  Says his groin is a little sore   Vitals:   01/10/20 1105 01/10/20 1120  BP: (!) 120/59 108/60  Pulse: 68 65  Resp: (!) 9 14  Temp:    SpO2: 96% 96%    Incisions:   Left groin and left AK incisions look good  Extremities:  Brisk doppler signals left DP/PT/peroneal Cardiac:  regular Lungs:  Non labored    Assessment/Plan:  This is a 64 y.o. male who is s/p  1.  Left common femoral and superficial femoral endarterectomy with Dacron patch angioplasty 2.  Left common femoral to above-knee popliteal artery bypass with 6 mm ringed PTFE  -pt doing well in recovery with patent bypass and brisk doppler signals left PT/DP/peroneal.  -incisions look good -to 4 east later today.   Doreatha Massed, PA-C 01/10/2020 11:42 AM [phone]

## 2020-01-10 NOTE — Anesthesia Postprocedure Evaluation (Signed)
Anesthesia Post Note  Patient: Rick Logan  Procedure(s) Performed: BYPASS GRAFT FEMORAL-POPLITEAL ARTERY USING GORE PROPATEN VASCULAR GRAFT (Left ) PATCH ANGIOPLASTY USING HEMASHIELD PLATINUM FINESSE PATCH (Left )     Patient location during evaluation: PACU Anesthesia Type: General Level of consciousness: awake and alert, oriented and patient cooperative Pain management: pain level controlled Vital Signs Assessment: post-procedure vital signs reviewed and stable Respiratory status: spontaneous breathing, nonlabored ventilation and respiratory function stable Cardiovascular status: blood pressure returned to baseline and stable Postop Assessment: no apparent nausea or vomiting Anesthetic complications: no    Last Vitals:  Vitals:   01/10/20 1105 01/10/20 1120  BP: (!) 120/59 108/60  Pulse: 68 65  Resp: (!) 9 14  Temp:    SpO2: 96% 96%    Last Pain:  Vitals:   01/10/20 1120  TempSrc:   PainSc: 5                  Lannie Fields

## 2020-01-10 NOTE — Discharge Instructions (Signed)
 [facility]   Discharge instructions  Lower Extremity Bypass Surgery  Please refer to the following instruction for your post-procedure care. Your surgeon or physician assistant will discuss any changes with you.  Activity  You are encouraged to walk as much as you can. You can slowly return to normal activities during the month after your surgery. Avoid strenuous activity and heavy lifting until your doctor tells you it's OK. Avoid activities such as vacuuming or swinging a golf club. Do not drive until your doctor give the OK and you are no longer taking prescription pain medications. It is also normal to have difficulty with sleep habits, eating and bowel movement after surgery. These will go away with time.  Bathing/Showering  Shower daily after you go home. Do not soak in a bathtub, hot tub, or swim until the incision heals completely.  Incision Care  Clean your incision with mild soap and water. Shower every day. Pat the area dry with a clean towel. You do not need a bandage unless otherwise instructed. Do not apply any ointments or creams to your incision. If you have open wounds you will be instructed how to care for them or a visiting nurse may be arranged for you. If you have staples or sutures along your incision they will be removed at your post-op appointment. You may have skin glue on your incision. Do not peel it off. It will come off on its own in about one week.  Wash the groin wound with soap and water daily and pat dry. (No tub bath-only shower)  Then put a dry gauze or washcloth in the groin to keep this area dry to help prevent wound infection.  Do this daily and as needed.  Do not use Vaseline or neosporin on your incisions.  Only use soap and water on your incisions and then protect and keep dry.  Diet  Resume your normal diet. There are no special food restrictions following this procedure. A low fat/ low cholesterol diet is  recommended for all patients with vascular disease. In order to heal from your surgery, it is CRITICAL to get adequate nutrition. Your body requires vitamins, minerals, and protein. Vegetables are the best source of vitamins and minerals. Vegetables also provide the perfect balance of protein. Processed food has little nutritional value, so try to avoid this.  Medications  Resume taking all your medications unless your doctor or physician assistant tells you not to. If your incision is causing pain, you may take over-the-counter pain relievers such as acetaminophen (Tylenol). If you were prescribed a stronger pain medication, please aware these medication can cause nausea and constipation. Prevent nausea by taking the medication with a snack or meal. Avoid constipation by drinking plenty of fluids and eating foods with high amount of fiber, such as fruits, vegetables, and grains. Take Colace 100 mg (an over-the-counter stool softener) twice a day as needed for constipation.  Do not take Tylenol if you are taking prescription pain medications.  Follow Up  Our office will schedule a follow up appointment 2-3 weeks following discharge.  Please call us immediately for any of the following conditions  Severe or worsening pain in your legs or feet while at rest or while walking Increase pain, redness, warmth, or drainage (pus) from your incision site(s) Fever of 101 degree or higher The swelling in your leg with the bypass suddenly worsens and becomes more painful than when you were in the hospital If you have   been instructed to feel your graft pulse then you should do so every day. If you can no longer feel this pulse, call the office immediately. Not all patients are given this instruction.  Leg swelling is common after leg bypass surgery.  The swelling should improve over a few months following surgery. To improve the swelling, you may elevate your legs above the level of your heart while you are  sitting or resting. Your surgeon or physician assistant may ask you to apply an ACE wrap or wear compression (TED) stockings to help to reduce swelling.  Reduce your risk of vascular disease  Stop smoking. If you would like help call [facility] at 1-800-QUIT-NOW (1-800-784-8669) or [place] at 336-586-4000.  Manage your cholesterol Maintain a desired weight Control your diabetes weight Control your diabetes Keep your blood pressure down  If you have any questions, please call the office at [phone]  

## 2020-01-10 NOTE — Progress Notes (Signed)
Patient arrived to 4E room 19 at this time. Telemetry applied and CCMD notified. V/s and assessment complete. A line clean dry and intact, zero'd. Left groin and thigh incision clean dry and intact. Level 0. CHG bath done. Patient oriented to room and how to call nurse with any needs.

## 2020-01-10 NOTE — Anesthesia Procedure Notes (Signed)
Procedure Name: Intubation Date/Time: 01/10/2020 7:50 AM Performed by: Rosiland Oz, CRNA Pre-anesthesia Checklist: Patient identified, Emergency Drugs available, Suction available and Patient being monitored Patient Re-evaluated:Patient Re-evaluated prior to induction Oxygen Delivery Method: Circle system utilized Preoxygenation: Pre-oxygenation with 100% oxygen Induction Type: IV induction Ventilation: Oral airway inserted - appropriate to patient size and Mask ventilation without difficulty Laryngoscope Size: Miller and 3 Grade View: Grade I Tube type: Oral Tube size: 7.5 mm Number of attempts: 2 (SRNA DL x 1 with esophageal intubation; CRNA DL x1, Grade I View, ETT passed successfully) Airway Equipment and Method: Stylet Placement Confirmation: positive ETCO2,  ETT inserted through vocal cords under direct vision and CO2 detector Secured at: 22 cm Tube secured with: Tape Dental Injury: Teeth and Oropharynx as per pre-operative assessment  Comments: SRNA Kandra Nicolas DLx1, ETT passed to esophagus. ETT removed and patient mask ventilated. CRNA Lenise Arena DLx1 with Grade I view, ETT passed successfully. Tube placement confirmed. Pt O2 saturation remained >98% throughout.

## 2020-01-10 NOTE — Discharge Summary (Signed)
Discharge Summary     FED CECI 27-Dec-1955 64 y.o. male  [id number]  Admission Date: 01/10/2020  Discharge Date: 01/13/2020  Physician: No att. providers found  Admission Diagnosis: PAD (peripheral artery disease) (HCC) [I73.9]  HPI:   This is a 64 y.o. male previous history of claudication intervened with atherectomy in Jan 31, 2015. He sustained sudden cardiac death 01/30/17 now has a defibrillator. He has quit smoking since then does take aspirin and a statin drug. Does have short distance claudication approximately 50 yards worse when he is walking up hills. He did have a repeat angiography but no intervention was undertaken. Risk factors for vascular disease include history of former smoking, hypertension hyperlipidemia. Denies rest pain or tissue loss.   Hospital Course:  The patient was admitted to the hospital and taken to the operating room on 01/10/2020 and underwent: 1.  Left common femoral and superficial femoral endarterectomy with Dacron patch angioplasty 2.  Left common femoral to above-knee popliteal artery bypass with 6 mm ringed PTFE    Findings: Common femoral artery was heavily calcified and subtotally occluded although this was much different than the previous angiography.  Femoral endarterectomy was performed up into the external iliac artery.  We also performed extensive superficial femoral artery pulling as much calcified plaque as we could to get good backbleeding.  There was brisk flow returning from the profunda after endarterectomy with profundoplasty.  We performed patch angioplasty of this.  There was very strong inflow and good signal in the profunda artery is runoff.  We then performed bypass to the above-knee popliteal artery.  This was somewhat calcified but clamped able.  We also perform limited endarterectomy of the above-knee popliteal artery.  At completion there was good signal distally in the posterior tibial and peroneal arteries these were graft  dependent.  If the bypass fails patient could have conversion to below-knee popliteal bypass or even possible endovascular options now that his SFA is not flush occluded.  The pt tolerated the procedure well and was transported to the PACU in good condition.   In the pacu, pt had brisk doppler signals left foot and incisions looked good without hematoma.    By POD 1, he was doing well with brisk doppler signals left DP/PT/peroneal.  He has ambulated.    POD 2, he was doing well with good doppler signals and was discharged home.   The remainder of the hospital course consisted of increasing mobilization and increasing intake of solids without difficulty.  CBC    Component Value Date/Time   WBC 14.0 (H) 01/11/2020 0424   RBC 3.80 (L) 01/11/2020 0424   HGB 11.8 (L) 01/11/2020 0424   HGB 14.4 03/02/2019 1558   HCT 34.7 (L) 01/11/2020 0424   HCT 41.6 03/02/2019 1558   PLT 218 01/11/2020 0424   PLT 247 03/02/2019 1558   MCV 91.3 01/11/2020 0424   MCV 90 03/02/2019 1558   MCH 31.1 01/11/2020 0424   MCHC 34.0 01/11/2020 0424   RDW 13.0 01/11/2020 0424   RDW 12.8 03/02/2019 1558   LYMPHSABS 2.3 08/19/2015 1014   MONOABS 1.0 08/19/2015 1014   EOSABS 0.3 08/19/2015 1014   BASOSABS 0.0 08/19/2015 1014    BMET    Component Value Date/Time   NA 136 01/11/2020 0424   NA 138 03/02/2019 1558   K 4.2 01/11/2020 0424   CL 104 01/11/2020 0424   CO2 22 01/11/2020 0424   GLUCOSE 149 (H) 01/11/2020 0424   BUN 14 01/11/2020 0424  BUN 14 03/02/2019 1558   CREATININE 0.87 01/11/2020 0424   CREATININE 0.85 08/19/2015 1014   CALCIUM 8.6 (L) 01/11/2020 0424   GFRNONAA >60 01/11/2020 0424   GFRAA >60 01/11/2020 0424     Discharge Instructions    Call MD for:  redness, tenderness, or signs of infection (pain, swelling, bleeding, redness, odor or green/yellow discharge around incision site)   Complete by: As directed    Call MD for:  severe or increased pain, loss or decreased feeling  in  affected limb(s)   Complete by: As directed    Call MD for:  temperature >100.5   Complete by: As directed    Discharge instructions   Complete by: As directed    You may shower as needed   Driving Restrictions   Complete by: As directed    No driving for 1 week   Resume previous diet   Complete by: As directed       Discharge Diagnosis:  PAD (peripheral artery disease) ([place]) [I73.9]  Secondary Diagnosis: Patient Active Problem List   Diagnosis Date Noted  . PAD (peripheral artery disease) ([place]) 01/10/2020  . Right-sided extracranial carotid artery stenosis 12/06/2017  . Systolic heart failure ([place]) 07/27/2017  . Non-ST elevation (NSTEMI) myocardial infarction ([place]) 06/03/2017  . Aspiration pneumonia ([place])   . Syncope 06/01/2017  . Ventricular fibrillation ([place]) 06/01/2017  . Hyperlipidemia 06/01/2017  . Tobacco use 06/01/2017  . Cardiac arrest ([place]) 06/01/2017  . PVD (peripheral vascular disease) ([place]) 08/29/2015  . Essential hypertension 07/30/2015  . Claudication ([place]) 07/30/2015   Past Medical History:  Diagnosis Date  . AICD (automatic cardioverter/defibrillator) present   . Arthritis    "joints get sore at times" (08/28/2015)  . Cardiac arrest ([place])    Vfib   . Carotid artery disease ([place])   . Claudication of calf muscles (HCC)    left lower extremity  . Dyspnea   . Essential hypertension   . Hyperlipidemia   . Hypertension   . Myocardial infarction ([place])   . PVD (peripheral vascular disease) ([place])   . Smoking   . Systolic heart failure (HCC)    EF 30-35%  . Tobacco abuse      Allergies as of 01/12/2020   No Known Allergies     Medication List    TAKE these medications   aspirin EC 81 MG tablet Take 81 mg by mouth daily.   atorvastatin 80 MG tablet Commonly known as: LIPITOR Take 1 tablet (80 mg total) by mouth daily.   benazepril 40 MG tablet Commonly known as: LOTENSIN Take 0.5 tablets (20 mg total) by mouth at bedtime. What changed:  when to take this   carvedilol 3.125 MG tablet Commonly known as: COREG TAKE 1 TABLET BY MOUTH TWICE A DAY WITH A MEAL What changed:   how much to take  how to take this  when to take this  additional instructions   Coenzyme Q10 200 MG capsule Take 1 capsule (200 mg total) by mouth daily.   oxyCODONE-acetaminophen 5-325 MG tablet Commonly known as: Percocet Take 1 tablet by mouth every 6 (six) hours as needed.   Tylenol 8 Hour Arthritis Pain 650 MG CR tablet Generic drug: acetaminophen Take 650 mg by mouth every 8 (eight) hours as needed for pain.   Vascepa 1 g capsule Generic drug: icosapent Ethyl Take 2 capsules (2 g total) by mouth 2 (two) times daily.       Discharge Instructions: [facility] Discharge instructions Lower Extremity Bypass Surgery  Please refer to the following instruction for your post-procedure care. Your surgeon or physician assistant will discuss any changes with you.  Activity  You are encouraged to walk as much as you can. You can slowly return to normal activities during the month after your surgery. Avoid strenuous activity and heavy lifting until your doctor tells you it's OK. Avoid activities such as vacuuming or swinging a golf club. Do not drive until your doctor give the OK and you are no longer taking prescription pain medications. It is also normal to have difficulty with sleep habits, eating and bowel movement after surgery. These will go away with time.  Bathing/Showering  You may shower after you go home. Do not soak in a bathtub, hot tub, or swim until the incision heals completely.  Incision Care  Clean your incision with mild soap and water. Shower every day. Pat the area dry with a clean towel. You do not need a bandage unless otherwise instructed. Do not apply any ointments or creams to your incision. If you have open wounds you will be instructed how to care for them or a visiting nurse may be  arranged for you. If you have staples or sutures along your incision they will be removed at your post-op appointment. You may have skin glue on your incision. Do not peel it off. It will come off on its own in about one week.  Wash the groin wound with soap and water daily and pat dry. (No tub bath-only shower)  Then put a dry gauze or washcloth in the groin to keep this area dry to help prevent wound infection.  Do this daily and as needed.  Do not use Vaseline or neosporin on your incisions.  Only use soap and water on your incisions and then protect and keep dry.  Diet  Resume your normal diet. There are no special food restrictions following this procedure. A low fat/ low cholesterol diet is recommended for all patients with vascular disease. In order to heal from your surgery, it is CRITICAL to get adequate nutrition. Your body requires vitamins, minerals, and protein. Vegetables are the best source of vitamins and minerals. Vegetables also provide the perfect balance of protein. Processed food has little nutritional value, so try to avoid this.  Medications  Resume taking all your medications unless your doctor or Physician Assistant tells you not to. If your incision is causing pain, you may take over-the-counter pain relievers such as acetaminophen (Tylenol). If you were prescribed a stronger pain medication, please aware these medication can cause nausea and constipation. Prevent nausea by taking the medication with a snack or meal. Avoid constipation by drinking plenty of fluids and eating foods with high amount of fiber, such as fruits, vegetables, and grains. Take Colace 100 mg (an over-the-counter stool softener) twice a day as needed for constipation.  Do not take Tylenol if you are taking prescription pain medications.  Follow Up  Our office will schedule a follow up appointment 2-3 weeks following discharge.  Please call us immediately for any of the following conditions  .Severe  or worsening pain in your legs or feet while at rest or while walking .Increase pain, redness, warmth, or drainage (pus) from your incision site(s) . Fever of 101 degree or higher . The swelling in your leg with the bypass suddenly worsens and becomes more painful than when you were in the hospital . If you have been instructed  to feel your graft pulse then you should do so every day. If you can no longer feel this pulse, call the office immediately. Not all patients are given this instruction. .  Leg swelling is common after leg bypass surgery.  The swelling should improve over a few months following surgery. To improve the swelling, you may elevate your legs above the level of your heart while you are sitting or resting. Your surgeon or physician assistant may ask you to apply an ACE wrap or wear compression (TED) stockings to help to reduce swelling.  Reduce your risk of vascular disease  Stop smoking. If you would like help call [facility] at 1-800-QUIT-NOW ((586)454-8555) or [place] at (720) 882-2199.  . Manage your cholesterol . Maintain a desired weight . Control your diabetes weight . Control your diabetes . Keep your blood pressure down .  If you have any questions, please call the office at [phone]   Prescriptions given: 1.  Roxicet #30 No Refill  Disposition: home  Patient's condition: is Good  Follow up: 1. Dr. Randie Heinz in 2-3 weeks   Doreatha Massed, PA-C [facility] [phone] 01/14/2020  8:08 AM  - For VQI Registry use ---   Post-op:  Wound infection: No  Graft infection: No  Transfusion: No    If yes, n/a units given New Arrhythmia: No Ipsilateral amputation: No, [ ]  Minor, [ ]  BKA, [ ]  AKA Discharge patency: [x ] Primary, [ ]  Primary assisted, [ ]  Secondary, [ ]  Occluded Patency judged by: [x ] Dopper only, [ ]  Palpable graft pulse, []  Palpable distal pulse, [ ]  ABI inc. > 0.15, [ ]  Duplex Discharge ABI: R not done, L  D/C  Ambulatory Status: Ambulatory  Complications: MI: No, [ ]  Troponin only, [ ]  EKG or Clinical CHF: No Resp failure:No, [ ]  Pneumonia, [ ]  Ventilator Chg in renal function: No, [ ]  Inc. Cr > 0.5, [ ]  Temp. Dialysis,  [ ]  Permanent dialysis Stroke: No, [ ]  Minor, [ ]  Major Return to OR: No  Reason for return to OR: [ ]  Bleeding, [ ]  Infection, [ ]  Thrombosis, [ ]  Revision  Discharge medications: Statin use:  yes ASA use:  yes Plavix use:  no Beta blocker use: yes CCB use:  No ACEI use:   yes ARB use:  no Coumadin use: no

## 2020-01-11 LAB — CBC
HCT: 34.7 % — ABNORMAL LOW (ref 39.0–52.0)
Hemoglobin: 11.8 g/dL — ABNORMAL LOW (ref 13.0–17.0)
MCH: 31.1 pg (ref 26.0–34.0)
MCHC: 34 g/dL (ref 30.0–36.0)
MCV: 91.3 fL (ref 80.0–100.0)
Platelets: 218 10*3/uL (ref 150–400)
RBC: 3.8 MIL/uL — ABNORMAL LOW (ref 4.22–5.81)
RDW: 13 % (ref 11.5–15.5)
WBC: 14 10*3/uL — ABNORMAL HIGH (ref 4.0–10.5)
nRBC: 0 % (ref 0.0–0.2)

## 2020-01-11 LAB — BASIC METABOLIC PANEL
Anion gap: 10 (ref 5–15)
BUN: 14 mg/dL (ref 8–23)
CO2: 22 mmol/L (ref 22–32)
Calcium: 8.6 mg/dL — ABNORMAL LOW (ref 8.9–10.3)
Chloride: 104 mmol/L (ref 98–111)
Creatinine, Ser: 0.87 mg/dL (ref 0.61–1.24)
GFR calc Af Amer: >60 mL/min (ref >60)
GFR calc non Af Amer: >60 mL/min (ref >60)
Glucose, Bld: 149 mg/dL — ABNORMAL HIGH (ref 70–99)
Potassium: 4.2 mmol/L (ref 3.5–5.1)
Sodium: 136 mmol/L (ref 135–145)

## 2020-01-11 NOTE — Evaluation (Signed)
Physical Therapy Evaluation and Discharge Patient Details Name: Rick Logan MRN: [id number] DOB: 1956-09-25 Today's Date: 01/11/2020   History of Present Illness  Pt is a 64 y/o male with PMH of arthritis, vfib, HTN, PVD, heart failure,ICD implant, s/p L femoral to popliteal artery bypass 01/10/20.  Clinical Impression  Patient presents with decresaed mobility due to procedure and pain, but mobilizing well without assistive device.  Feel stable for home when medically ready.  Educated to walk twice more today ok with family if not on IV's and for asking RN.  RN made aware. PT to sign off.     Follow Up Recommendations No PT follow up    Equipment Recommendations  None recommended by PT    Recommendations for Other Services       Precautions / Restrictions Precautions Precautions: None Restrictions Weight Bearing Restrictions: No      Mobility  Bed Mobility Overal bed mobility: Modified Independent                Transfers Overall transfer level: Modified independent   Transfers: Sit to/from Stand Sit to Stand: Modified independent (Device/Increase time)            Ambulation/Gait Ambulation/Gait assistance: Supervision Gait Distance (Feet): 270 Feet Assistive device: None Gait Pattern/deviations: Decreased stride length     General Gait Details: mild antalgia on the L with ambulation, demonstrated good toe off and heel strike with slightly limited knee flexion in terminal stance  Stairs Stairs: Yes   Stair Management: One rail Left;Step to pattern;Forwards Number of Stairs: 4 General stair comments: cues for technique, pt able to demonstrate safely  Wheelchair Mobility    Modified Rankin (Stroke Patients Only)       Balance Overall balance assessment: Mild deficits observed, not formally tested                                           Pertinent Vitals/Pain Pain Assessment: 0-10 Pain Score: 6  Pain Location: L LE  (groin)  Pain Descriptors / Indicators: Aching Pain Intervention(s): Monitored during session;Repositioned    Home Living Family/patient expects to be discharged to:: Private residence Living Arrangements: Spouse/significant other Available Help at Discharge: Family;Available PRN/intermittently Type of Home: Apartment Home Access: Stairs to enter Entrance Stairs-Rails: Can reach both Entrance Stairs-Number of Steps: 3 Home Layout: One level Home Equipment: Cane - single point;Crutches      Prior Function Level of Independence: Independent         Comments: driving, retired      Higher education careers adviser   Dominant Hand: Right    Extremity/Trunk Assessment   Upper Extremity Assessment Upper Extremity Assessment: Defer to OT evaluation    Lower Extremity Assessment Lower Extremity Assessment: LLE deficits/detail LLE Deficits / Details: limited AROM due to pain, strength at least 3+/5       Communication   Communication: No difficulties  Cognition Arousal/Alertness: Awake/alert Behavior During Therapy: WFL for tasks assessed/performed Overall Cognitive Status: Within Functional Limits for tasks assessed                                        General Comments General comments (skin integrity, edema, etc.): noted incisions in L LE with surgical closure, no noted drainage    Exercises  Assessment/Plan    PT Assessment Patent does not need any further PT services  PT Problem List         PT Treatment Interventions      PT Goals (Current goals can be found in the Care Plan section)  Acute Rehab PT Goals Patient Stated Goal: to get home soon    Frequency     Barriers to discharge        Co-evaluation               AM-PAC PT "6 Clicks" Mobility  Outcome Measure Help needed turning from your back to your side while in a flat bed without using bedrails?: None Help needed moving from lying on your back to sitting on the side of a flat  bed without using bedrails?: None Help needed moving to and from a bed to a chair (including a wheelchair)?: None Help needed standing up from a chair using your arms (e.g., wheelchair or bedside chair)?: None Help needed to walk in hospital room?: None Help needed climbing 3-5 steps with a railing? : None 6 Click Score: 24    End of Session   Activity Tolerance: Patient tolerated treatment well Patient left: in bed;with call bell/phone within reach   PT Visit Diagnosis: Difficulty in walking, not elsewhere classified (R26.2)    Time: 1051-1101 PT Time Calculation (min) (ACUTE ONLY): 10 min   Charges:   PT Evaluation $PT Eval Low Complexity: [place], [place] [phone] 01/11/2020   Reginia Naas 01/11/2020, 12:29 PM

## 2020-01-11 NOTE — Evaluation (Addendum)
Occupational Therapy Evaluation and Discharge Patient Details Name: Rick Logan MRN: [id number] DOB: 11/22/1955 Today's Date: 01/11/2020    History of Present Illness Pt is a 64 y/o male with PMH of arthritis, vfib, HTN, PVD, heart failure,ICD implant, s/p L femoral to popliteal artery bypass 01/10/20.   Clinical Impression   PTA patient independent and driving. Admitted for above and limited by pain in groin (L), decreased activity tolerance. Requires min assist for LB ADLs, but educated on compensatory techniques and will have spouse to assist as needed; supervision for transfers and in room mobility. Discussed safety with shower transfers and bathing, options for Rick Logan if he feels necessary.  Patient with no further questions or concerns at this time.  Based on performance today, no further OT needs have been identified and OT will sign off. Thank you for this referral.      Follow Up Recommendations  No OT follow up;Supervision - Intermittent    Equipment Recommendations  None recommended by OT    Recommendations for Other Services       Precautions / Restrictions Restrictions Weight Bearing Restrictions: No      Mobility Bed Mobility Overal bed mobility: Modified Independent             General bed mobility comments: HOB elevated but no assist required   Transfers Overall transfer level: Needs assistance   Transfers: Sit to/from Stand Sit to Stand: Supervision         General transfer comment: for safety, increased time and effort    Balance Overall balance assessment: Mild deficits observed, not formally tested                                         ADL either performed or assessed with clinical judgement   ADL Overall ADL's : Needs assistance/impaired     Grooming: Supervision/safety;Standing   Upper Body Bathing: Supervision/ safety;Sitting   Lower Body Bathing: Minimal assistance;Sit to/from stand   Upper Body Dressing :  Supervision/safety;Sitting   Lower Body Dressing: Minimal assistance;Sit to/from stand   Toilet Transfer: Supervision/safety;Ambulation Toilet Transfer Details (indicate cue type and reason): simulated to recliner        Tub/Shower Transfer Details (indicate cue type and reason): verbally reviewed technique, pt voices understanding stepping over tub threshold with UE support Functional mobility during ADLs: Supervision/safety General ADL Comments: pt educated on compensatory techniques for LB ADLs due to decreased functional reach to L LE     Vision   Vision Assessment?: No apparent visual deficits     Perception     Praxis      Pertinent Vitals/Pain Pain Assessment: 0-10 Pain Score: 4  Pain Location: L LE (groin)  Pain Descriptors / Indicators: Aching Pain Intervention(s): Limited activity within patient's tolerance;Monitored during session;Repositioned     Hand Dominance Right   Extremity/Trunk Assessment Upper Extremity Assessment Upper Extremity Assessment: Overall WFL for tasks assessed   Lower Extremity Assessment Lower Extremity Assessment: Defer to PT evaluation(s/p L fem pop bypass)   Cervical / Trunk Assessment Cervical / Trunk Assessment: Normal   Communication Communication Communication: No difficulties   Cognition Arousal/Alertness: Awake/alert Behavior During Therapy: WFL for tasks assessed/performed Overall Cognitive Status: Within Functional Limits for tasks assessed  General Comments       Exercises     Shoulder Instructions      Home Living Family/patient expects to be discharged to:: Private residence Living Arrangements: Spouse/significant other Available Help at Discharge: Family;Available PRN/intermittently(spouse works during the week ) Type of Home: Apartment Home Access: Stairs to enter Technical brewer of Steps: 3 Entrance Stairs-Rails: Can reach both Home Layout: One  level     Bathroom Shower/Tub: Teacher, early years/pre: Standard     Home Equipment: [employer] - single point;Crutches          Prior Functioning/Environment Level of Independence: Independent        Comments: driving, retired         OT Problem List: Pain;Decreased activity tolerance      OT Treatment/Interventions:      OT Goals(Current goals can be found in the care plan section) Acute Rehab OT Goals Patient Stated Goal: to get home soon OT Goal Formulation: With patient Time For Goal Achievement: 01/11/20  OT Frequency:     Barriers to D/C:            Co-evaluation              AM-PAC OT "6 Clicks" Daily Activity     Outcome Measure Help from another person eating meals?: None Help from another person taking care of personal grooming?: A Little Help from another person toileting, which includes using toliet, bedpan, or urinal?: A Little Help from another person bathing (including washing, rinsing, drying)?: A Little Help from another person to put on and taking off regular upper body clothing?: None Help from another person to put on and taking off regular lower body clothing?: A Little 6 Click Score: 20   End of Session Nurse Communication: Mobility status  Activity Tolerance: Patient tolerated treatment well Patient left: in chair;with call bell/phone within reach  OT Visit Diagnosis: Pain;Other abnormalities of gait and mobility (R26.89) Pain - Right/Left: Left Pain - part of body: Leg                Time: 0017-4944 OT Time Calculation (min): 14 min Charges:  OT General Charges $OT Visit: 1 Visit OT Evaluation $OT Eval Low Complexity: 1 Low  Rick Logan, OT Acute Rehabilitation Services Pager [phone] Office [phone]   Rick Logan 01/11/2020, 8:23 AM

## 2020-01-11 NOTE — Progress Notes (Addendum)
  Progress Note    01/11/2020 7:34 AM 1 Day Post-Op  Subjective:  Soreness in left groin; says he has walked in the halls.  Afebrile HR 60's-100's NSR/ST 110's-130's systolic 96% RA  Vitals:   01/10/20 2313 01/11/20 0415  BP: 113/66 123/64  Pulse:    Resp: 16 16  Temp: 97.6 F (36.4 C) 98.3 F (36.8 C)  SpO2: 95% 96%    Physical Exam: Cardiac:  regular Lungs:  Non labored Incisions:  Left groin and left AK incisions look good Extremities:  Brisk doppler signals left DP/PT/peroneal   CBC    Component Value Date/Time   WBC 14.0 (H) 01/11/2020 0424   RBC 3.80 (L) 01/11/2020 0424   HGB 11.8 (L) 01/11/2020 0424   HGB 14.4 03/02/2019 1558   HCT 34.7 (L) 01/11/2020 0424   HCT 41.6 03/02/2019 1558   PLT 218 01/11/2020 0424   PLT 247 03/02/2019 1558   MCV 91.3 01/11/2020 0424   MCV 90 03/02/2019 1558   MCH 31.1 01/11/2020 0424   MCHC 34.0 01/11/2020 0424   RDW 13.0 01/11/2020 0424   RDW 12.8 03/02/2019 1558   LYMPHSABS 2.3 08/19/2015 1014   MONOABS 1.0 08/19/2015 1014   EOSABS 0.3 08/19/2015 1014   BASOSABS 0.0 08/19/2015 1014    BMET    Component Value Date/Time   NA 136 01/11/2020 0424   NA 138 03/02/2019 1558   K 4.2 01/11/2020 0424   CL 104 01/11/2020 0424   CO2 22 01/11/2020 0424   GLUCOSE 149 (H) 01/11/2020 0424   BUN 14 01/11/2020 0424   BUN 14 03/02/2019 1558   CREATININE 0.87 01/11/2020 0424   CREATININE 0.85 08/19/2015 1014   CALCIUM 8.6 (L) 01/11/2020 0424   GFRNONAA >60 01/11/2020 0424   GFRAA >60 01/11/2020 0424    INR    Component Value Date/Time   INR 1.0 01/07/2020 1020     Intake/Output Summary (Last 24 hours) at 01/11/2020 0734 Last data filed at 01/11/2020 0300 Gross per 24 hour  Intake 2465.81 ml  Output 1350 ml  Net 1115.81 ml     Assessment:  64 y.o. male is s/p:  1.  Left common femoral and superficial femoral endarterectomy with Dacron patch angioplasty 2.  Left common femoral to above-knee popliteal artery bypass  with 6 mm ringed PTFE  1 Day Post-Op  Plan: -pt doing well this am with brisk doppler signals left DP/PT/peroneal -pt has ambulated in the hallways -still with some soreness in left groin.  Will need at least another day for pain control and continue ambulation.  -DVT prophylaxis:  Sq heparin   Doreatha Massed, PA-C [facility] [phone] 01/11/2020 7:34 AM  I have independently interviewed and examined patient and agree with PA assessment and plan above.   Arvid Marengo C. Randie Heinz, MD [facility] [place] Office: [phone] Pager: [phone]

## 2020-01-12 MED ORDER — OXYCODONE-ACETAMINOPHEN 5-325 MG PO TABS: 1.0000 | ORAL_TABLET | Freq: Four times a day (QID) | ORAL | 0 refills | Status: DC | PRN

## 2020-01-12 NOTE — Plan of Care (Signed)
DISCHARGE NOTE HOME ARSHDEEP BOLGER to be discharged home per MD order. Discussed prescriptions and follow up appointments with the patient. Prescriptions given to patient; medication list explained in detail. Patient verbalized understanding.  Skin clean, dry and intact without evidence of skin break down, no evidence of skin tears noted. IV catheter discontinued intact. Site without signs and symptoms of complications. Dressing and pressure applied. Pt denies pain at the site currently. No complaints noted.  Patient free of lines, drains, and wounds.   An After Visit Summary (AVS) was printed and given to the patient. Patient escorted via wheelchair, and discharged home via private auto.  Arlice Colt, RN

## 2020-01-12 NOTE — Progress Notes (Signed)
Ambulation offered to patient, but he declines and said he will do it later on.

## 2020-01-12 NOTE — Progress Notes (Addendum)
[facility] [place]  Subjective  - Doing well and ready to go home.   Objective 122/61 64 98.3 F (36.8 C) (Oral) 14 97% No intake or output data in the 24 hours ending 01/12/20 0838  Left groin soft without hematoma Left AK incision healing well B LE DP/PT doppler signals Lungs non labored breathing  Assessment/Planning: POD # 2  1.Left common femoral and superficial femoral endarterectomy with Dacron patch angioplasty 2.Left common femoral to above-knee popliteal artery bypass with 6 mm ringed PTFE   Patent bypass with good doppler signals Disposition stable for discharge F/U in 2-3 weeks.  Mosetta Pigeon 01/12/2020 8:38 AM --  Laboratory Lab Results: Recent Labs    01/10/20 1441 01/11/20 0424  WBC 12.1* 14.0*  HGB 13.4 11.8*  HCT 38.8* 34.7*  PLT 208 218   BMET Recent Labs    01/10/20 1441 01/11/20 0424  NA  --  136  K  --  4.2  CL  --  104  CO2  --  22  GLUCOSE  --  149*  BUN  --  14  CREATININE 0.85 0.87  CALCIUM  --  8.6*    COAG Lab Results  Component Value Date   INR 1.0 01/07/2020   INR 1.37 06/01/2017   INR 1.04 08/19/2015   No results found for: PTT  I have seen and evaluated the patient. I agree with the PA note as documented above.  64 year old male postop day 2 status post left common femoral endarterectomy and common femoral to above-knee popliteal bypass with PTFE.  Incisions look great.  He has a palpable PT pulse.  We will plan for discharge today.  Cephus Shelling, MD [facility] [place] Office: [phone]

## 2020-01-14 NOTE — Discharge Summary (Signed)
Discharge Summary     Rick Logan November 05, 1955 64 y.o. male  [id number]  Admission Date: 01/10/2020  Discharge Date: Jan 18, 2020  Physician: Dr. Lemar Livings  Admission Diagnosis: PAD (peripheral artery disease) [facility]) [I73.9]  HPI:   This is a 64 y.o. male with previous history of claudication intervened with atherectomy in 01-18-15. He sustained sudden cardiac death January 17, 2017 now has a defibrillator. He has quit smoking since then does take aspirin and a statin drug. Does have short distance claudication approximately 50 yards worse when he is walking up hills.  He has undergone angiography which demonstrates flush occlusion of his SFA.  He reconstitutes above the knee popliteal artery. He has runoff via 2 vessels which appear to be the peroneal and posterior tibial artery. Risk factors for vascular disease include history of former smoking, hypertension hyperlipidemia. Denies rest pain or tissue loss.   Hospital Course:  The patient was admitted to the hospital and taken to the operating room on 01/10/2020 and underwent: Left common femoral and superficial femoral endarterectomy with Dacron patch angioplasty and left common femoral to above knee popliteal artery bypass with 6 mm ringed PTFE.  Findings: Common femoral artery was heavily calcified and subtotally occluded although this was much different than the previous angiography.  Femoral endarterectomy was performed up into the external iliac artery.  We also performed extensive superficial femoral artery pulling as much calcified plaque as we could to get good backbleeding.  There was brisk flow returning from the profunda after endarterectomy with profundoplasty.  We performed patch angioplasty of this.  There was very strong inflow and good signal in the profunda artery is runoff.  We then performed bypass to the above-knee popliteal artery.  This was somewhat calcified but clamped able.  We also perform limited endarterectomy of the  above-knee popliteal artery.  At completion there was good signal distally in the posterior tibial and peroneal arteries these were graft dependent.  If the bypass fails patient could have conversion to below-knee popliteal bypass or even possible endovascular options now that his SFA is not flush occluded.  The pt tolerated the procedure well and was transported to the PACU in good condition. He did well in the recovery room. Left lower extremity with patent bypass and brisk doppler signals PT/DP/ peroneal. Incisions intact. He was transferred to the floor in stable condition later in the day.   By POD 1, he complained of some left groin soreness but up ambulating in the halls. Left lower extremity with continued adequate perfusion with brisk Dopper DP/PT/peroneal signals. Incision remained clean, dry and intact. Continued mobilization and working with PT/OT who felt he did not need any outpatient PT/OT. Rest of day consisted of pain control and continued mobilization  By POD 2, continued to do really well and was eager to go home. Left lower extremity well perfused with patent bypass. Incision remained intact. The remainder of the hospital course consisted of increasing mobilization and adequate pain control. He will be discharged home on Aspirin and Atorvastatin. He will continue his home medications as prescribed. He will follow up in the office with Dr. Randie Heinz in 3 weeks   CBC    Component Value Date/Time   WBC 14.0 (H) 01/11/2020 0424   RBC 3.80 (L) 01/11/2020 0424   HGB 11.8 (L) 01/11/2020 0424   HGB 14.4 03/02/2019 1558   HCT 34.7 (L) 01/11/2020 0424   HCT 41.6 03/02/2019 1558   PLT 218 01/11/2020 0424   PLT 247 03/02/2019 1558  MCV 91.3 01/11/2020 0424   MCV 90 03/02/2019 1558   MCH 31.1 01/11/2020 0424   MCHC 34.0 01/11/2020 0424   RDW 13.0 01/11/2020 0424   RDW 12.8 03/02/2019 1558   LYMPHSABS 2.3 08/19/2015 1014   MONOABS 1.0 08/19/2015 1014   EOSABS 0.3 08/19/2015 1014    BASOSABS 0.0 08/19/2015 1014    BMET    Component Value Date/Time   NA 136 01/11/2020 0424   NA 138 03/02/2019 1558   K 4.2 01/11/2020 0424   CL 104 01/11/2020 0424   CO2 22 01/11/2020 0424   GLUCOSE 149 (H) 01/11/2020 0424   BUN 14 01/11/2020 0424   BUN 14 03/02/2019 1558   CREATININE 0.87 01/11/2020 0424   CREATININE 0.85 08/19/2015 1014   CALCIUM 8.6 (L) 01/11/2020 0424   GFRNONAA >60 01/11/2020 0424   GFRAA >60 01/11/2020 0424     Discharge Instructions    Call MD for:  redness, tenderness, or signs of infection (pain, swelling, bleeding, redness, odor or green/yellow discharge around incision site)   Complete by: As directed    Call MD for:  severe or increased pain, loss or decreased feeling  in affected limb(s)   Complete by: As directed    Call MD for:  temperature >100.5   Complete by: As directed    Discharge instructions   Complete by: As directed    You may shower as needed   Driving Restrictions   Complete by: As directed    No driving for 1 week   Resume previous diet   Complete by: As directed       Discharge Diagnosis:  PAD (peripheral artery disease) (HCC) [I73.9]  Secondary Diagnosis: Patient Active Problem List   Diagnosis Date Noted  . PAD (peripheral artery disease) (HCC) 01/10/2020  . Right-sided extracranial carotid artery stenosis 12/06/2017  . Systolic heart failure (HCC) 07/27/2017  . Non-ST elevation (NSTEMI) myocardial infarction (HCC) 06/03/2017  . Aspiration pneumonia (HCC)   . Syncope 06/01/2017  . Ventricular fibrillation (HCC) 06/01/2017  . Hyperlipidemia 06/01/2017  . Tobacco use 06/01/2017  . Cardiac arrest (HCC) 06/01/2017  . PVD (peripheral vascular disease) (HCC) 08/29/2015  . Essential hypertension 07/30/2015  . Claudication (HCC) 07/30/2015   Past Medical History:  Diagnosis Date  . AICD (automatic cardioverter/defibrillator) present   . Arthritis    "joints get sore at times" (08/28/2015)  . Cardiac arrest (HCC)     Vfib   . Carotid artery disease (HCC)   . Claudication of calf muscles (HCC)    left lower extremity  . Dyspnea   . Essential hypertension   . Hyperlipidemia   . Hypertension   . Myocardial infarction (HCC)   . PVD (peripheral vascular disease) (HCC)   . Smoking   . Systolic heart failure (HCC)    EF 30-35%  . Tobacco abuse      Allergies as of 01/12/2020   No Known Allergies     Medication List    TAKE these medications   aspirin EC 81 MG tablet Take 81 mg by mouth daily.   atorvastatin 80 MG tablet Commonly known as: LIPITOR Take 1 tablet (80 mg total) by mouth daily.   benazepril 40 MG tablet Commonly known as: LOTENSIN Take 0.5 tablets (20 mg total) by mouth at bedtime. What changed: when to take this   carvedilol 3.125 MG tablet Commonly known as: COREG TAKE 1 TABLET BY MOUTH TWICE A DAY WITH A MEAL What changed:   how much  to take  how to take this  when to take this  additional instructions   Coenzyme Q10 200 MG capsule Take 1 capsule (200 mg total) by mouth daily.   oxyCODONE-acetaminophen 5-325 MG tablet Commonly known as: Percocet Take 1 tablet by mouth every 6 (six) hours as needed.   Tylenol 8 Hour Arthritis Pain 650 MG CR tablet Generic drug: acetaminophen Take 650 mg by mouth every 8 (eight) hours as needed for pain.   Vascepa 1 g capsule Generic drug: icosapent Ethyl Take 2 capsules (2 g total) by mouth 2 (two) times daily.       Discharge Instructions: [facility] Discharge instructions Lower Extremity Bypass Surgery  Please refer to the following instruction for your post-procedure care. Your surgeon or physician assistant will discuss any changes with you.  Activity  You are encouraged to walk as much as you can. You can slowly return to normal activities during the month after your surgery. Avoid strenuous activity and heavy lifting until your doctor tells you it's OK. Avoid activities such  as vacuuming or swinging a golf club. Do not drive until your doctor give the OK and you are no longer taking prescription pain medications. It is also normal to have difficulty with sleep habits, eating and bowel movement after surgery. These will go away with time.  Bathing/Showering  You may shower after you go home. Do not soak in a bathtub, hot tub, or swim until the incision heals completely.  Incision Care  Clean your incision with mild soap and water. Shower every day. Pat the area dry with a clean towel. You do not need a bandage unless otherwise instructed. Do not apply any ointments or creams to your incision. If you have open wounds you will be instructed how to care for them or a visiting nurse may be arranged for you. If you have staples or sutures along your incision they will be removed at your post-op appointment. You may have skin glue on your incision. Do not peel it off. It will come off on its own in about one week.  Wash the groin wound with soap and water daily and pat dry. (No tub bath-only shower)  Then put a dry gauze or washcloth in the groin to keep this area dry to help prevent wound infection.  Do this daily and as needed.  Do not use Vaseline or neosporin on your incisions.  Only use soap and water on your incisions and then protect and keep dry.  Diet  Resume your normal diet. There are no special food restrictions following this procedure. A low fat/ low cholesterol diet is recommended for all patients with vascular disease. In order to heal from your surgery, it is CRITICAL to get adequate nutrition. Your body requires vitamins, minerals, and protein. Vegetables are the best source of vitamins and minerals. Vegetables also provide the perfect balance of protein. Processed food has little nutritional value, so try to avoid this.  Medications  Resume taking all your medications unless your doctor or Physician Assistant tells you not to. If your incision is causing  pain, you may take over-the-counter pain relievers such as acetaminophen (Tylenol). If you were prescribed a stronger pain medication, please aware these medication can cause nausea and constipation. Prevent nausea by taking the medication with a snack or meal. Avoid constipation by drinking plenty of fluids and eating foods with high amount of fiber, such as fruits, vegetables, and grains. Take Colace 100  mg (an over-the-counter stool softener) twice a day as needed for constipation.  Do not take Tylenol if you are taking prescription pain medications.  Follow Up  Our office will schedule a follow up appointment 2-3 weeks following discharge.  Please call us immediately for any of the following conditions  .Severe or worsening pain in your legs or feet while at rest or while walking .Increase pain, redness, warmth, or drainage (pus) from your incision site(s) . Fever of 101 degree or higher . The swelling in your leg with the bypass suddenly worsens and becomes more painful than when you were in the hospital . If you have been instructed to feel your graft pulse then you should do so every day. If you can no longer feel this pulse, call the office immediately. Not all patients are given this instruction. .  Leg swelling is common after leg bypass surgery.  The swelling should improve over a few months following surgery. To improve the swelling, you may elevate your legs above the level of your heart while you are sitting or resting. Your surgeon or physician assistant may ask you to apply an ACE wrap or wear compression (TED) stockings to help to reduce swelling.  Reduce your risk of vascular disease  Stop smoking. If you would like help call [facility] at 1-800-QUIT-NOW ((726)542-5920) or [place] at 5341459836.  . Manage your cholesterol . Maintain a desired weight . Control your diabetes weight . Control your diabetes . Keep your blood pressure down .  If you have any questions,  please call the office at [phone]   Prescriptions given: Oxycodone/ Acetaminophen 5/325 one tablet by mouth every 6 hours as needed for moderate to severe pain #30, 0 (zero) refills  Disposition: Home  Patient's condition: is Excellent  Follow up: 1. Dr. Randie Heinz in 3 weeks   Graceann Congress, [place] [facility] [phone] 01/14/2020  8:06 AM  - For VQI Registry use ---   Post-op:  Wound infection: No  Graft infection: No  Transfusion: No    If yes, n/a units given New Arrhythmia: No Ipsilateral amputation: No, [ ]  Minor, [ ]  BKA, [ ]  AKA Discharge patency: ] Primary, [ ]  Primary assisted, [ ]  Secondary, [ ]  Occluded Patency judged by: [ ]  Dopper only, ] Palpable graft pulse,[ X] Palpable distal pulse, [ ]  ABI inc. > 0.15, [ ]  Duplex D/C Ambulatory Status: Ambulatory  Complications: MI: No, [ ]  Troponin only, [ ]  EKG or Clinical CHF: No Resp failure:No, [ ]  Pneumonia, [ ]  Ventilator Chg in renal function: No, [ ]  Inc. Cr > 0.5, [ ]  Temp. Dialysis,  [ ]  Permanent dialysis Stroke: No, [ ]  Minor, [ ]  Major Return to OR: No  Reason for return to OR: [ ]  Bleeding, [ ]  Infection, [ ]  Thrombosis, [ ]  Revision  Discharge medications: Statin use:  yes ASA use:  yes Plavix use:  no Beta blocker use: yes CCB use:  No ACEI use:   yes ARB use:  no Coumadin use: no

## 2020-01-15 DIAGNOSIS — Z951 Presence of aortocoronary bypass graft: Secondary | ICD-10-CM | POA: Diagnosis not present

## 2020-01-15 DIAGNOSIS — I252 Old myocardial infarction: Secondary | ICD-10-CM | POA: Diagnosis not present

## 2020-01-15 DIAGNOSIS — I110 Hypertensive heart disease with heart failure: Secondary | ICD-10-CM | POA: Diagnosis not present

## 2020-01-15 DIAGNOSIS — Z8674 Personal history of sudden cardiac arrest: Secondary | ICD-10-CM | POA: Diagnosis not present

## 2020-01-15 DIAGNOSIS — I70213 Atherosclerosis of native arteries of extremities with intermittent claudication, bilateral legs: Secondary | ICD-10-CM | POA: Diagnosis not present

## 2020-01-15 DIAGNOSIS — I6521 Occlusion and stenosis of right carotid artery: Secondary | ICD-10-CM | POA: Diagnosis not present

## 2020-01-15 DIAGNOSIS — F17200 Nicotine dependence, unspecified, uncomplicated: Secondary | ICD-10-CM | POA: Diagnosis not present

## 2020-01-15 DIAGNOSIS — Z48812 Encounter for surgical aftercare following surgery on the circulatory system: Secondary | ICD-10-CM | POA: Diagnosis not present

## 2020-01-15 DIAGNOSIS — I5022 Chronic systolic (congestive) heart failure: Secondary | ICD-10-CM | POA: Diagnosis not present

## 2020-01-17 DIAGNOSIS — Z951 Presence of aortocoronary bypass graft: Secondary | ICD-10-CM | POA: Diagnosis not present

## 2020-01-17 DIAGNOSIS — I5022 Chronic systolic (congestive) heart failure: Secondary | ICD-10-CM | POA: Diagnosis not present

## 2020-01-17 DIAGNOSIS — I6521 Occlusion and stenosis of right carotid artery: Secondary | ICD-10-CM | POA: Diagnosis not present

## 2020-01-17 DIAGNOSIS — I70213 Atherosclerosis of native arteries of extremities with intermittent claudication, bilateral legs: Secondary | ICD-10-CM | POA: Diagnosis not present

## 2020-01-17 DIAGNOSIS — Z48812 Encounter for surgical aftercare following surgery on the circulatory system: Secondary | ICD-10-CM | POA: Diagnosis not present

## 2020-01-17 DIAGNOSIS — I110 Hypertensive heart disease with heart failure: Secondary | ICD-10-CM | POA: Diagnosis not present

## 2020-01-18 DIAGNOSIS — I110 Hypertensive heart disease with heart failure: Secondary | ICD-10-CM | POA: Diagnosis not present

## 2020-01-18 DIAGNOSIS — Z48812 Encounter for surgical aftercare following surgery on the circulatory system: Secondary | ICD-10-CM | POA: Diagnosis not present

## 2020-01-18 DIAGNOSIS — Z951 Presence of aortocoronary bypass graft: Secondary | ICD-10-CM | POA: Diagnosis not present

## 2020-01-18 DIAGNOSIS — I5022 Chronic systolic (congestive) heart failure: Secondary | ICD-10-CM | POA: Diagnosis not present

## 2020-01-18 DIAGNOSIS — I70213 Atherosclerosis of native arteries of extremities with intermittent claudication, bilateral legs: Secondary | ICD-10-CM | POA: Diagnosis not present

## 2020-01-18 DIAGNOSIS — I6521 Occlusion and stenosis of right carotid artery: Secondary | ICD-10-CM | POA: Diagnosis not present

## 2020-01-21 ENCOUNTER — Other Ambulatory Visit: Payer: Self-pay | Admitting: Cardiology

## 2020-01-22 DIAGNOSIS — Z951 Presence of aortocoronary bypass graft: Secondary | ICD-10-CM | POA: Diagnosis not present

## 2020-01-22 DIAGNOSIS — I70213 Atherosclerosis of native arteries of extremities with intermittent claudication, bilateral legs: Secondary | ICD-10-CM | POA: Diagnosis not present

## 2020-01-22 DIAGNOSIS — Z48812 Encounter for surgical aftercare following surgery on the circulatory system: Secondary | ICD-10-CM | POA: Diagnosis not present

## 2020-01-22 DIAGNOSIS — I6521 Occlusion and stenosis of right carotid artery: Secondary | ICD-10-CM | POA: Diagnosis not present

## 2020-01-22 DIAGNOSIS — I110 Hypertensive heart disease with heart failure: Secondary | ICD-10-CM | POA: Diagnosis not present

## 2020-01-22 DIAGNOSIS — I5022 Chronic systolic (congestive) heart failure: Secondary | ICD-10-CM | POA: Diagnosis not present

## 2020-01-23 ENCOUNTER — Other Ambulatory Visit: Payer: Self-pay

## 2020-01-23 ENCOUNTER — Encounter: Payer: Self-pay | Admitting: Student

## 2020-01-23 ENCOUNTER — Ambulatory Visit (INDEPENDENT_AMBULATORY_CARE_PROVIDER_SITE_OTHER): Payer: Medicare Other | Admitting: Student

## 2020-01-23 VITALS — BP 118/70 | HR 64 | Ht 71.0 in | Wt 210.4 lb

## 2020-01-23 DIAGNOSIS — R0602 Shortness of breath: Secondary | ICD-10-CM | POA: Diagnosis not present

## 2020-01-23 DIAGNOSIS — I739 Peripheral vascular disease, unspecified: Secondary | ICD-10-CM

## 2020-01-23 DIAGNOSIS — I5022 Chronic systolic (congestive) heart failure: Secondary | ICD-10-CM | POA: Diagnosis not present

## 2020-01-23 DIAGNOSIS — I2510 Atherosclerotic heart disease of native coronary artery without angina pectoris: Secondary | ICD-10-CM

## 2020-01-23 DIAGNOSIS — I10 Essential (primary) hypertension: Secondary | ICD-10-CM

## 2020-01-23 LAB — CUP PACEART INCLINIC DEVICE CHECK
Battery Remaining Longevity: 120 mo
Battery Voltage: 3 V
Brady Statistic RV Percent Paced: 0.01 %
Date Time Interrogation Session: 20210428105436
HighPow Impedance: 95 Ohm
Implantable Lead Implant Date: 20180910
Implantable Lead Location: 753860
Implantable Pulse Generator Implant Date: 20180910
Lead Channel Impedance Value: 513 Ohm
Lead Channel Impedance Value: 589 Ohm
Lead Channel Pacing Threshold Amplitude: 0.625 V
Lead Channel Pacing Threshold Pulse Width: 0.4 ms
Lead Channel Sensing Intrinsic Amplitude: 4.875 mV
Lead Channel Sensing Intrinsic Amplitude: 6.625 mV
Lead Channel Setting Pacing Amplitude: 2.5 V
Lead Channel Setting Pacing Pulse Width: 0.4 ms
Lead Channel Setting Sensing Sensitivity: 0.3 mV

## 2020-01-23 NOTE — Progress Notes (Signed)
Electrophysiology Office Note Date: 01/23/2020  ID:  Rick Logan, DOB 08-27-1956, MRN [id number]  PCP: [name] Cardiologist: Lesleigh Noe, MD Electrophysiologist: Sherryl Manges, MD   CC: Routine ICD follow-up  [name] is a 64 y.o. male seen today for Sherryl Manges, MD for routine electrophysiology followup.  Since last being seen in our clinic the patient reports doing well. He recentl had PAD surgery by Dr. Randie Heinz.  he denies chest pain, palpitations, dyspnea, PND, orthopnea, nausea, vomiting, dizziness, syncope, edema, weight gain, or early satiety. He has not had ICD shocks.   Device History: Medtronic Single Chamber ICD implanted 2018 for aborted cardiac arrest History of appropriate therapy: No History of AAD therapy: No   Past Medical History:  Diagnosis Date  . AICD (automatic cardioverter/defibrillator) present   . Arthritis    "joints get sore at times" (08/28/2015)  . Cardiac arrest (HCC)    Vfib   . Carotid artery disease (HCC)   . Claudication of calf muscles (HCC)    left lower extremity  . Dyspnea   . Essential hypertension   . Hyperlipidemia   . Hypertension   . Myocardial infarction (HCC)   . PVD (peripheral vascular disease) (HCC)   . Smoking   . Systolic heart failure (HCC)    EF 30-35%  . Tobacco abuse    Past Surgical History:  Procedure Laterality Date  . ABDOMINAL AORTOGRAM W/LOWER EXTREMITY Bilateral 03/08/2019   Procedure: ABDOMINAL AORTOGRAM W/LOWER EXTREMITY;  Surgeon: Runell Gess, MD;  Location: [facility] INVASIVE CV LAB;  Service: Cardiovascular;  Laterality: Bilateral;  . ABDOMINAL AORTOGRAM W/LOWER EXTREMITY Bilateral 12/10/2019   Procedure: ABDOMINAL AORTOGRAM W/LOWER EXTREMITY;  Surgeon: Maeola Harman, MD;  Location: [facility] INVASIVE CV LAB;  Service: Cardiovascular;  Laterality: Bilateral;  . ABDOMINAL SURGERY     No details available  . APPENDECTOMY    . COLON  SURGERY    . EXPLORATORY LAPAROTOMY  1970's X 2   "part of small intestines; part of my bowels; appendix; 2nd OR was for adhesions"  . FEMORAL-POPLITEAL BYPASS GRAFT Left 01/10/2020   Procedure: BYPASS GRAFT FEMORAL-POPLITEAL ARTERY USING GORE PROPATEN VASCULAR GRAFT;  Surgeon: Maeola Harman, MD;  Location: [facility] OR;  Service: Vascular;  Laterality: Left;  . ICD IMPLANT N/A 06/06/2017   Procedure: ICD Implant;  Surgeon: Duke Salvia, MD;  Location: [facility] INVASIVE CV LAB;  Service: Cardiovascular;  Laterality: N/A;  . LEFT HEART CATH AND CORONARY ANGIOGRAPHY N/A 06/03/2017   Procedure: LEFT HEART CATH AND CORONARY ANGIOGRAPHY;  Surgeon: [place], Peter M, MD;  Location: [facility] INVASIVE CV LAB;  Service: Cardiovascular;  Laterality: N/A;  . PATCH ANGIOPLASTY Left 01/10/2020   Procedure: PATCH ANGIOPLASTY USING HEMASHIELD PLATINUM FINESSE PATCH;  Surgeon: Maeola Harman, MD;  Location: [facility] OR;  Service: Vascular;  Laterality: Left;  . PERIPHERAL VASCULAR CATHETERIZATION N/A 08/28/2015   Procedure: Lower Extremity Angiography;  Surgeon: Runell Gess, MD;  Location: [facility] INVASIVE CV LAB;  Service: Cardiovascular;  Laterality: N/A;  . TONSILLECTOMY      Current Outpatient Medications  Medication Sig Dispense Refill  . acetaminophen (TYLENOL 8 HOUR ARTHRITIS PAIN) 650 MG CR tablet Take 650 mg by mouth every 8 (eight) hours as needed for pain.     [name] aspirin EC 81 MG tablet Take 81 mg by mouth daily.    [name] atorvastatin (LIPITOR) 80 MG tablet Take 1 tablet (80 mg total) by mouth daily. 90  tablet 3  . benazepril (LOTENSIN) 40 MG tablet Take 0.5 tablets (20 mg total) by mouth at bedtime. 45 tablet 3  . carvedilol (COREG) 3.125 MG tablet TAKE 1 TABLET BY MOUTH TWICE A DAY WITH MEALS 180 tablet 3  . Coenzyme Q10 200 MG capsule Take 1 capsule (200 mg total) by mouth daily. 90 capsule 3   No current facility-administered medications for this visit.    Allergies:   Patient has no known allergies.     Social History: Social History   Socioeconomic History  . Marital status: Married    Spouse name: Not on file  . Number of children: Not on file  . Years of education: Not on file  . Highest education level: Not on file  Occupational History  . Not on file  Tobacco Use  . Smoking status: Former Smoker    Packs/day: 1.00    Years: 41.00    Pack years: 41.00    Types: Cigarettes    Quit date: 06/01/2017    Years since quitting: 2.6  . Smokeless tobacco: Never Used  Substance and Sexual Activity  . Alcohol use: Yes  . Drug use: No  . Sexual activity: Not Currently  Other Topics Concern  . Not on file  Social History Narrative   ** Merged History Encounter **       Social Determinants of Health   Financial Resource Strain:   . Difficulty of Paying Living Expenses:   Food Insecurity:   . Worried About Charity fundraiser in the Last Year:   . Arboriculturist in the Last Year:   Transportation Needs:   . Film/video editor (Medical):   [name] Lack of Transportation (Non-Medical):   Physical Activity:   . Days of Exercise per Week:   . Minutes of Exercise per Session:   Stress:   . Feeling of Stress :   Social Connections:   . Frequency of Communication with Friends and Family:   . Frequency of Social Gatherings with Friends and Family:   . Attends Religious Services:   . Active Member of Clubs or Organizations:   . Attends Archivist Meetings:   [name] Marital Status:   Intimate Partner Violence:   . Fear of Current or Ex-Partner:   . Emotionally Abused:   [name] Physically Abused:   . Sexually Abused:     Family History: Family History  Problem Relation Age of Onset  . Cancer Father     Review of Systems: All other systems reviewed and are otherwise negative except as noted above.   Physical Exam: Vitals:   01/23/20 1025  BP: 118/70  Pulse: 64  SpO2: 95%  Weight: 210 lb 6.4 oz (95.4 kg)  Height: 5\' 11"  (1.803 m)     GEN- The patient is well  appearing, alert and oriented x 3 today.   HEENT: normocephalic, atraumatic; sclera clear, conjunctiva pink; hearing intact; oropharynx clear; neck supple, no JVP Lymph- no cervical lymphadenopathy Lungs- Clear to ausculation bilaterally, normal work of breathing.  No wheezes, rales, rhonchi Heart- Regular rate and rhythm, no murmurs, rubs or gallops, PMI not laterally displaced GI- soft, non-tender, non-distended, bowel sounds present, no hepatosplenomegaly Extremities- no clubbing, cyanosis, or edema; DP/PT/radial pulses 2+ bilaterally MS- no significant deformity or atrophy Skin- warm and dry, no rash or lesion; ICD pocket well healed Psych- euthymic mood, full affect Neuro- strength and sensation are intact  ICD interrogation- reviewed in detail today,  See  PACEART report  EKG:  EKG is ordered today. The ekg ordered today shows NSR at 64 bpm  Recent Labs: 01/07/2020: ALT 41 01/11/2020: BUN 14; Creatinine, Ser 0.87; Hemoglobin 11.8; Platelets 218; Potassium 4.2; Sodium 136   Wt Readings from Last 3 Encounters:  01/23/20 210 lb 6.4 oz (95.4 kg)  01/07/20 211 lb 8 oz (95.9 kg)  12/10/19 210 lb (95.3 kg)     Other studies Reviewed: Additional studies/ records that were reviewed today include: Echo 04/2019 LVEF 55-60%, Previous EP office notes, recent admission/op notes, previous remotes.    Assessment and Plan:  1.  Aborted cardiac arrest s/p Medtronic single chamber ICD  Most recent EF with now resolved LV dysfunction Stable on an appropriate medical regimen Continue coreg 3.125 mg BID and benazepril 20 mg daily. Normal ICD function See Arita Miss Art report No changes today  2. PAD Followed by Dr. Randie Heinz S/p left common femoral and superficial femoral endarterectomy Follow up as scheduled.  3. Orthostatic hypotension Confounded at times by vertiginous symptoms. Stable today.    Current medicines are reviewed at length with the patient today.   The patient does not have  concerns regarding his medicines.  The following changes were made today:  none  Labs/ tests ordered today include:  Orders Placed This Encounter  Procedures  . EKG 12-Lead     Disposition:   Follow up with Dr. Graciela Husbands in 12 months.   Dustin Flock, PA-C  01/23/2020 11:01 AM  [facility] [address] [place] [address] [phone] (office) [phone] (fax)

## 2020-01-23 NOTE — Patient Instructions (Signed)
Medication Instructions:  none *If you need a refill on your cardiac medications before your next appointment, please call your pharmacy*   Lab Work: none If you have labs (blood work) drawn today and your tests are completely normal, you will receive your results only by: [name] MyChart Message (if you have MyChart) OR . A paper copy in the mail If you have any lab test that is abnormal or we need to change your treatment, we will call you to review the results.   Testing/Procedures: none   Follow-Up: At [facility], you and your health needs are our priority.  As part of our continuing mission to provide you with exceptional heart care, we have created designated Provider Care Teams.  These Care Teams include your primary Cardiologist (physician) and Advanced Practice Providers (APPs -  Physician Assistants and Nurse Practitioners) who all work together to provide you with the care you need, when you need it.  We recommend signing up for the patient portal called "MyChart".  Sign up information is provided on this After Visit Summary.  MyChart is used to connect with patients for Virtual Visits (Telemedicine).  Patients are able to view lab/test results, encounter notes, upcoming appointments, etc.  Non-urgent messages can be sent to your provider as well.   To learn more about what you can do with MyChart, go to [email or web address].    Your next appointment:   1 year(s)  The format for your next appointment:   Either In Person or Virtual  Provider:   Dr Graciela Husbands   Other Instructions Remote monitoring is used to monitor your ICD from home. This monitoring reduces the number of office visits required to check your device to one time per year. It allows [place] to keep an eye on the functioning of your device to ensure it is working properly. You are scheduled for a device check from home on 03/05/20. You may send your transmission at any time that day. If you have a wireless device, the  transmission will be sent automatically. After your physician reviews your transmission, you will receive a postcard with your next transmission date.

## 2020-01-24 DIAGNOSIS — Z48812 Encounter for surgical aftercare following surgery on the circulatory system: Secondary | ICD-10-CM | POA: Diagnosis not present

## 2020-01-24 DIAGNOSIS — I6521 Occlusion and stenosis of right carotid artery: Secondary | ICD-10-CM | POA: Diagnosis not present

## 2020-01-24 DIAGNOSIS — I5022 Chronic systolic (congestive) heart failure: Secondary | ICD-10-CM | POA: Diagnosis not present

## 2020-01-24 DIAGNOSIS — I110 Hypertensive heart disease with heart failure: Secondary | ICD-10-CM | POA: Diagnosis not present

## 2020-01-24 DIAGNOSIS — Z951 Presence of aortocoronary bypass graft: Secondary | ICD-10-CM | POA: Diagnosis not present

## 2020-01-24 DIAGNOSIS — I70213 Atherosclerosis of native arteries of extremities with intermittent claudication, bilateral legs: Secondary | ICD-10-CM | POA: Diagnosis not present

## 2020-01-29 DIAGNOSIS — I70213 Atherosclerosis of native arteries of extremities with intermittent claudication, bilateral legs: Secondary | ICD-10-CM | POA: Diagnosis not present

## 2020-01-29 DIAGNOSIS — Z951 Presence of aortocoronary bypass graft: Secondary | ICD-10-CM | POA: Diagnosis not present

## 2020-01-29 DIAGNOSIS — I6521 Occlusion and stenosis of right carotid artery: Secondary | ICD-10-CM | POA: Diagnosis not present

## 2020-01-29 DIAGNOSIS — I5022 Chronic systolic (congestive) heart failure: Secondary | ICD-10-CM | POA: Diagnosis not present

## 2020-01-29 DIAGNOSIS — I110 Hypertensive heart disease with heart failure: Secondary | ICD-10-CM | POA: Diagnosis not present

## 2020-01-29 DIAGNOSIS — Z48812 Encounter for surgical aftercare following surgery on the circulatory system: Secondary | ICD-10-CM | POA: Diagnosis not present

## 2020-01-31 ENCOUNTER — Other Ambulatory Visit: Payer: Self-pay

## 2020-01-31 ENCOUNTER — Other Ambulatory Visit (HOSPITAL_COMMUNITY): Payer: Self-pay | Admitting: Interventional Cardiology

## 2020-01-31 ENCOUNTER — Ambulatory Visit (HOSPITAL_COMMUNITY)
Admission: RE | Admit: 2020-01-31 | Discharge: 2020-01-31 | Disposition: A | Discharge status: Home / Self Care | Payer: Medicare Other | Source: Ambulatory Visit | Attending: Cardiology | Admitting: Cardiology

## 2020-01-31 ENCOUNTER — Telehealth: Payer: Self-pay | Admitting: *Deleted

## 2020-01-31 DIAGNOSIS — I6523 Occlusion and stenosis of bilateral carotid arteries: Secondary | ICD-10-CM

## 2020-01-31 NOTE — Telephone Encounter (Signed)
-----   Message from Rick Records, MD sent at 01/31/2020 12:17 PM EDT ----- Let the patient know slight worsening on right side. Repeat carotid doppler in 6 months A copy will be sent to [name]

## 2020-01-31 NOTE — Telephone Encounter (Signed)
Informed pt of results. Pt verbalized understanding. 

## 2020-02-01 ENCOUNTER — Encounter: Payer: Self-pay | Admitting: Vascular Surgery

## 2020-02-01 ENCOUNTER — Ambulatory Visit (INDEPENDENT_AMBULATORY_CARE_PROVIDER_SITE_OTHER): Payer: Self-pay | Admitting: Vascular Surgery

## 2020-02-01 VITALS — BP 125/75 | HR 61 | Temp 97.7°F | Resp 20 | Ht 71.0 in | Wt 207.0 lb

## 2020-02-01 DIAGNOSIS — I739 Peripheral vascular disease, unspecified: Secondary | ICD-10-CM

## 2020-02-01 DIAGNOSIS — I6521 Occlusion and stenosis of right carotid artery: Secondary | ICD-10-CM

## 2020-02-01 NOTE — Progress Notes (Signed)
    Subjective:     Patient ID: Rick Logan, male   DOB: [birth year], 64 y.o.   MRN: [id number]  HPI 64 year old male status post left femoropopliteal bypass and common femoral endarterectomy for left lower extremity severe life limiting claudication.  This time left leg symptoms have resolved he does have some swelling and some numbness the anterior leg but otherwise okay.  Carotid duplex was performed yesterday.  Patient has no previous history of stroke TIA or amaurosis.   Review of Systems Numbness left leg    Objective:   Physical Exam Vitals:   02/01/20 0818  BP: 125/75  Pulse: 61  Resp: 20  Temp: 97.7 F (36.5 C)  SpO2: 97%  Awake alert oriented Nonlabored respirations No carotid bruits Palpable left posterior tibial pulse     Assessment/plan     65 year old male status post left femoropopliteal bypass for severe life limiting claudication.  This is now resolved he has a palpable pulse.  From the standpoint he will need duplex in 3 months and ABIs.  Patient also has carotid duplex demonstrating 60 to 79% stenosis although he states the test was performed a few times and he is right on the borderline probably right at 79 to 80%.  He is asymptomatic.  We will get a CT angio of his head and neck to evaluate further as patient will possibly need carotid intervention.  After CT scan he will follow-up to discuss his carotid artery disease.    Siddh Vandeventer C. Randie Heinz, MD [facility] Office: [phone] Pager: [phone]

## 2020-02-04 ENCOUNTER — Other Ambulatory Visit: Payer: Self-pay | Admitting: *Deleted

## 2020-02-04 DIAGNOSIS — I739 Peripheral vascular disease, unspecified: Secondary | ICD-10-CM

## 2020-02-06 DIAGNOSIS — Z951 Presence of aortocoronary bypass graft: Secondary | ICD-10-CM | POA: Diagnosis not present

## 2020-02-06 DIAGNOSIS — I6521 Occlusion and stenosis of right carotid artery: Secondary | ICD-10-CM | POA: Diagnosis not present

## 2020-02-06 DIAGNOSIS — I5022 Chronic systolic (congestive) heart failure: Secondary | ICD-10-CM | POA: Diagnosis not present

## 2020-02-06 DIAGNOSIS — Z48812 Encounter for surgical aftercare following surgery on the circulatory system: Secondary | ICD-10-CM | POA: Diagnosis not present

## 2020-02-06 DIAGNOSIS — I110 Hypertensive heart disease with heart failure: Secondary | ICD-10-CM | POA: Diagnosis not present

## 2020-02-06 DIAGNOSIS — I70213 Atherosclerosis of native arteries of extremities with intermittent claudication, bilateral legs: Secondary | ICD-10-CM | POA: Diagnosis not present

## 2020-02-11 ENCOUNTER — Other Ambulatory Visit: Payer: Self-pay

## 2020-02-11 DIAGNOSIS — I739 Peripheral vascular disease, unspecified: Secondary | ICD-10-CM

## 2020-02-11 DIAGNOSIS — I6521 Occlusion and stenosis of right carotid artery: Secondary | ICD-10-CM

## 2020-02-14 ENCOUNTER — Other Ambulatory Visit: Payer: Self-pay | Admitting: Cardiology

## 2020-02-18 ENCOUNTER — Other Ambulatory Visit: Payer: Self-pay

## 2020-02-18 MED ORDER — BENAZEPRIL HCL 40 MG PO TABS: 20.0000 mg | ORAL_TABLET | Freq: Every day | ORAL | 0 refills | Status: DC

## 2020-02-18 NOTE — Telephone Encounter (Signed)
Pt's medication was sent to pt's pharmacy as requested. Confirmation received.  °

## 2020-03-04 ENCOUNTER — Ambulatory Visit
Admission: RE | Admit: 2020-03-04 | Discharge: 2020-03-04 | Disposition: A | Discharge status: Home / Self Care | Payer: Medicare Other | Source: Ambulatory Visit | Attending: Vascular Surgery | Admitting: Vascular Surgery

## 2020-03-04 ENCOUNTER — Inpatient Hospital Stay: Admission: RE | Admit: 2020-03-04 | Payer: Medicare Other | Source: Ambulatory Visit

## 2020-03-04 DIAGNOSIS — E781 Pure hyperglyceridemia: Secondary | ICD-10-CM | POA: Diagnosis not present

## 2020-03-04 DIAGNOSIS — E782 Mixed hyperlipidemia: Secondary | ICD-10-CM | POA: Diagnosis not present

## 2020-03-04 DIAGNOSIS — I6521 Occlusion and stenosis of right carotid artery: Secondary | ICD-10-CM

## 2020-03-04 DIAGNOSIS — I6523 Occlusion and stenosis of bilateral carotid arteries: Secondary | ICD-10-CM | POA: Diagnosis not present

## 2020-03-04 MED ORDER — IOPAMIDOL (ISOVUE-370) INJECTION 76%
75.0000 mL | Freq: Once | INTRAVENOUS | Status: AC | PRN
  Administered 2020-03-04: 75 mL via INTRAVENOUS

## 2020-03-05 ENCOUNTER — Ambulatory Visit (INDEPENDENT_AMBULATORY_CARE_PROVIDER_SITE_OTHER): Payer: Medicare Other | Admitting: *Deleted

## 2020-03-05 DIAGNOSIS — I4901 Ventricular fibrillation: Secondary | ICD-10-CM | POA: Diagnosis not present

## 2020-03-05 LAB — CUP PACEART REMOTE DEVICE CHECK
Battery Remaining Longevity: 119 mo
Battery Voltage: 3.01 V
Brady Statistic RV Percent Paced: 0.02 %
Date Time Interrogation Session: 20210609073824
HighPow Impedance: 80 Ohm
Implantable Lead Implant Date: 20180910
Implantable Lead Location: 753860
Implantable Pulse Generator Implant Date: 20180910
Lead Channel Impedance Value: 513 Ohm
Lead Channel Impedance Value: 551 Ohm
Lead Channel Pacing Threshold Amplitude: 0.625 V
Lead Channel Pacing Threshold Pulse Width: 0.4 ms
Lead Channel Sensing Intrinsic Amplitude: 5 mV
Lead Channel Sensing Intrinsic Amplitude: 5 mV
Lead Channel Setting Pacing Amplitude: 2.5 V
Lead Channel Setting Pacing Pulse Width: 0.4 ms
Lead Channel Setting Sensing Sensitivity: 0.3 mV

## 2020-03-05 LAB — LIPID PANEL
Chol/HDL Ratio: 4 ratio (ref 0.0–5.0)
Cholesterol, Total: 147 mg/dL (ref 100–199)
HDL: 37 mg/dL — ABNORMAL LOW (ref >39)
LDL Chol Calc (NIH): 70 mg/dL (ref 0–99)
Triglycerides: 244 mg/dL — ABNORMAL HIGH (ref 0–149)
VLDL Cholesterol Cal: 40 mg/dL (ref 5–40)

## 2020-03-06 ENCOUNTER — Other Ambulatory Visit: Payer: Self-pay

## 2020-03-06 ENCOUNTER — Encounter: Payer: Self-pay | Admitting: Internal Medicine

## 2020-03-06 ENCOUNTER — Ambulatory Visit (INDEPENDENT_AMBULATORY_CARE_PROVIDER_SITE_OTHER): Payer: Medicare Other | Admitting: Internal Medicine

## 2020-03-06 VITALS — BP 126/70 | HR 64 | Ht 71.5 in | Wt 204.0 lb

## 2020-03-06 DIAGNOSIS — E781 Pure hyperglyceridemia: Secondary | ICD-10-CM | POA: Diagnosis not present

## 2020-03-06 DIAGNOSIS — E782 Mixed hyperlipidemia: Secondary | ICD-10-CM

## 2020-03-06 DIAGNOSIS — I6523 Occlusion and stenosis of bilateral carotid arteries: Secondary | ICD-10-CM

## 2020-03-06 DIAGNOSIS — I739 Peripheral vascular disease, unspecified: Secondary | ICD-10-CM | POA: Diagnosis not present

## 2020-03-06 NOTE — Patient Instructions (Signed)
Medication Instructions:  Your physician recommends that you continue on your current medications as directed. Please refer to the Current Medication list given to you today.  *If you need a refill on your cardiac medications before your next appointment, please call your pharmacy*   Lab Work: FASTING lab work before next appointment   If you have labs (blood work) drawn today and your tests are completely normal, you will receive your results only by:  MyChart Message (if you have MyChart) OR  A paper copy in the mail If you have any lab test that is abnormal or we need to change your treatment, we will call you to review the results.   Testing/Procedures: NONE   Follow-Up: At [facility], you and your health needs are our priority.  As part of our continuing mission to provide you with exceptional heart care, we have created designated Provider Care Teams.  These Care Teams include your primary Cardiologist (physician) and Advanced Practice Providers (APPs -  Physician Assistants and Nurse Practitioners) who all work together to provide you with the care you need, when you need it.  We recommend signing up for the patient portal called "MyChart".  Sign up information is provided on this After Visit Summary.  MyChart is used to connect with patients for Virtual Visits (Telemedicine).  Patients are able to view lab/test results, encounter notes, upcoming appointments, etc.  Non-urgent messages can be sent to your provider as well.   To learn more about what you can do with MyChart, go to [email or web address].    Your next appointment:   12 month(s)  The format for your next appointment:   Either In Person or Virtual  Provider:   Kirtland Bouchard [place] Hilty, MD   Other Instructions

## 2020-03-06 NOTE — Progress Notes (Signed)
Remote ICD transmission.   

## 2020-03-06 NOTE — Progress Notes (Signed)
LIPID CLINIC CONSULT NOTE  Chief Complaint:  Follow-up lipids  Primary Care Physician: [facility]  Primary Cardiologist:  Sinclair Grooms, MD  HPI:  Rick Logan is a 64 y.o. male who is being seen today for the evaluation of dyslipidemia at the request of Sharmaine Base, [place]*. Rick Logan with is kindly referred for management of dyslipidemia.  He is a patient of Dr. Tamala Julian, who unfortunately suffered a cardiac arrest for ventricular fibrillation.  He also has a history of hypertension, dyslipidemia and tobacco abuse on and off.  He recently quit again.  He has known bilateral carotid artery disease and is status post AICD placement.  Recently was found to have claudication and was found to have proximal left SFA disease with history of atherectomy of the proximal and mid left SFA.  Repeat imaging in March 08, 2019 revealed totally occluded SFA and it was felt not to be a candidate for percutaneous intervention.  Medical therapy was recommended and referral to lipid clinic was initiated.  His labs 2 months ago showed a total cholesterol 176, triglycerides 740, HDL 29.  He was on atorvastatin 40 mg daily.  Since then he had made significant dietary changes reducing sweets, carbohydrates and other changes including smoking cessation again.  Repeat labs 3 weeks ago showed total cholesterol 161, triglycerides 307, HDL 36 and LDL of 64 (goal LDL less than 70).  While this is a much improved and favorable lipid profile, demonstrating dietary impact on triglycerides, he has persistently elevated triglycerides.  His best triglyceride numbers were a year ago at 151 which is essentially normal therefore diet is likely playing a big impact in this.  Despite this, however, there is persistent risk with elevated triglycerides as seen in the REDUCE-IT trial.   12/03/2019  Rick Logan is seen today in follow-up.  When we discussed over the summer I had recommended  starting Vascepa 2 g twice daily, however he said the cost would be about $200 a month since he had not met his medication deductible and that was felt to be too expensive to maintain.  We discussed that further today and I would like him to take advantage of some opportunities including the health well foundation which may provide some additional cost for the medicine.  I do not believe that there is a generic alternative for this although a generic has been produced it is not indicated for patients for prevention of coronary or peripheral arterial disease.  Likewise, over-the-counter fish oil, Lovaza, and other preparations did not show cardiovascular risk reduction.  We could also consider possible tier exception.  03/06/2020  Rick Logan is seen today for follow-up.  Unfortunately cost was too high for his Vascepa.  He is trying to make significant dietary changes.  In fact he has had a significant reduction in triglycerides.  Total cholesterol now 147, triglycerides 244 (reduced from 462), HDL 37 and LDL 70 (up from 56).  He said he is contemplating a move up to [place], [place] in the next year.  PMHx:  Past Medical History:  Diagnosis Date  . AICD (automatic cardioverter/defibrillator) present   . Arthritis    "joints get sore at times" (08/28/2015)  . Cardiac arrest ([place])    Vfib   . Carotid artery disease ([place])   . Claudication of calf muscles (HCC)    left lower extremity  . Dyspnea   . Essential hypertension   . Hyperlipidemia   . Hypertension   .  Myocardial infarction ([place])   . PVD (peripheral vascular disease) ([place])   . Smoking   . Systolic heart failure (HCC)    EF 30-35%  . Tobacco abuse     Past Surgical History:  Procedure Laterality Date  . ABDOMINAL AORTOGRAM W/LOWER EXTREMITY Bilateral 03/08/2019   Procedure: ABDOMINAL AORTOGRAM W/LOWER EXTREMITY;  Surgeon: Lorretta Harp, MD;  Location: [facility];  Service: Cardiovascular;  Laterality: Bilateral;  .  ABDOMINAL AORTOGRAM W/LOWER EXTREMITY Bilateral 12/10/2019   Procedure: ABDOMINAL AORTOGRAM W/LOWER EXTREMITY;  Surgeon: Waynetta Sandy, MD;  Location: [facility];  Service: Cardiovascular;  Laterality: Bilateral;  . ABDOMINAL SURGERY     No details available  . APPENDECTOMY    . COLON SURGERY    . EXPLORATORY LAPAROTOMY  1970's X 2   "part of small intestines; part of my bowels; appendix; 2nd OR was for adhesions"  . FEMORAL-POPLITEAL BYPASS GRAFT Left 01/10/2020   Procedure: BYPASS GRAFT FEMORAL-POPLITEAL ARTERY USING GORE PROPATEN VASCULAR GRAFT;  Surgeon: Waynetta Sandy, MD;  Location: [facility];  Service: Vascular;  Laterality: Left;  . ICD IMPLANT N/A 06/06/2017   Procedure: ICD Implant;  Surgeon: Deboraha Sprang, MD;  Location: [facility];  Service: Cardiovascular;  Laterality: N/A;  . LEFT HEART CATH AND CORONARY ANGIOGRAPHY N/A 06/03/2017   Procedure: LEFT HEART CATH AND CORONARY ANGIOGRAPHY;  Surgeon: Martinique, Peter M, MD;  Location: [facility];  Service: Cardiovascular;  Laterality: N/A;  . PATCH ANGIOPLASTY Left 01/10/2020   Procedure: [place];  Surgeon: Waynetta Sandy, MD;  Location: [facility];  Service: Vascular;  Laterality: Left;  . PERIPHERAL VASCULAR CATHETERIZATION N/A 08/28/2015   Procedure: Lower Extremity Angiography;  Surgeon: Lorretta Harp, MD;  Location: [facility];  Service: Cardiovascular;  Laterality: N/A;  . TONSILLECTOMY      FAMHx:  Family History  Problem Relation Age of Onset  . Cancer Father     SOCHx:   reports that he quit smoking about 2 years ago. His smoking use included cigarettes. He has a 41.00 pack-year smoking history. He has never used smokeless tobacco. He reports current alcohol use. He reports that he does not use drugs.  ALLERGIES:  No Known Allergies  ROS: Pertinent items noted in HPI and remainder of comprehensive ROS otherwise  negative.  HOME MEDS: Current Outpatient Medications on File Prior to Visit  Medication Sig Dispense Refill  . acetaminophen (TYLENOL 8 HOUR ARTHRITIS PAIN) 650 MG CR tablet Take 650 mg by mouth every 8 (eight) hours as needed for pain.     [name] aspirin EC 81 MG tablet Take 81 mg by mouth daily.    [name] atorvastatin (LIPITOR) 80 MG tablet Take 1 tablet (80 mg total) by mouth daily. 90 tablet 3  . benazepril (LOTENSIN) 40 MG tablet Take 0.5 tablets (20 mg total) by mouth at bedtime. Please make overdue appt with Dr. Tamala Julian before anymore refills. 1st attempt 15 tablet 0  . carvedilol (COREG) 3.125 MG tablet TAKE 1 TABLET BY MOUTH TWICE A DAY WITH MEALS 180 tablet 3  . Coenzyme Q10 200 MG capsule Take 1 capsule (200 mg total) by mouth daily. 90 capsule 3   No current facility-administered medications on file prior to visit.    LABS/IMAGING: Results for orders placed or performed in visit on 12/03/19 (from the past 48 hour(s))  Lipid panel     Status: Abnormal   Collection  Time: 03/04/20 11:35 AM  Result Value Ref Range   Cholesterol, Total 147 100 - 199 mg/dL   Triglycerides 244 (H) 0 - 149 mg/dL   HDL 37 (L) >39 mg/dL   VLDL Cholesterol Cal 40 5 - 40 mg/dL   LDL Chol Calc (NIH) 70 0 - 99 mg/dL   Chol/HDL Ratio 4.0 0.0 - 5.0 ratio    Comment:                                   T. Chol/HDL Ratio                                             Men  Women                               1/2 Avg.Risk  3.4    3.3                                   Avg.Risk  5.0    4.4                                2X Avg.Risk  9.6    7.1                                3X Avg.Risk 23.4   11.0    CT ANGIO HEAD W OR WO CONTRAST  Result Date: 03/05/2020 CLINICAL DATA:  Abnormal carotid ultrasound. Carotid artery stenosis EXAM: CT ANGIOGRAPHY HEAD AND NECK TECHNIQUE: Multidetector CT imaging of the head and neck was performed using the standard protocol during bolus administration of intravenous contrast. Multiplanar CT  image reconstructions and MIPs were obtained to evaluate the vascular anatomy. Carotid stenosis measurements (when applicable) are obtained utilizing NASCET criteria, using the distal internal carotid diameter as the denominator. Creatinine was obtained on site at [facility] at [address] Results: Creatinine 0.9 mg/dL. CONTRAST:  31m ISOVUE-370 IOPAMIDOL (ISOVUE-370) INJECTION 76% COMPARISON:  None. FINDINGS: CT HEAD FINDINGS Brain: No evidence of acute infarction, hemorrhage, hydrocephalus, extra-axial collection or mass lesion/mass effect. Remote lacunar infarct at the right caudate head. Brain volume is normal Vascular: See below Skull: Negative Sinuses: Negative Orbits: Negative Review of the MIP images confirms the above findings CTA NECK FINDINGS Aortic arch: Atheromatous plaque. Three vessel branching. Negative for aneurysm. Right carotid system: Diffuse atheromatous wall thickening of the common carotid. Mixed density plaque at the ICA bulb with 70% stenosis as measured on sagittal reformats. No ulceration or beading. The bifurcation is below the mandibular angle. Left carotid system: Atheromatous wall thickening of the common carotid. Mixed density plaque at the bulb without stenosis or ulceration. Negative for beading. Vertebral arteries: No proximal subclavian stenosis. The vertebral arteries are smooth and widely patent on both sides. Skeleton: Ordinary degenerative changes. Other neck: No notable incidental finding. Upper chest: Pacer lead from the left. Review of the MIP images confirms the above findings CTA HEAD FINDINGS Anterior circulation: Mild calcified plaque along the carotid siphons. No branch occlusion, beading, or aneurysm.  No flow limiting stenosis Posterior circulation: The vertebral and basilar arteries are smooth and widely patent. Mild atheromatous change to the bilateral PCA. No branch occlusion, beading, or aneurysm. Venous sinuses: Patent on both sides. Arachnoid  granulations are noted. Anatomic variants: None significant Review of the MIP images confirms the above findings IMPRESSION: 1. ~70% atheromatous narrowing at the right ICA bulb. 2. No flow limiting stenosis in the left carotid or vertebral circulation. Electronically Signed   By: Monte Fantasia M.D.   On: 03/05/2020 08:44   CT ANGIO NECK W OR WO CONTRAST  Result Date: 03/05/2020 CLINICAL DATA:  Abnormal carotid ultrasound. Carotid artery stenosis EXAM: CT ANGIOGRAPHY HEAD AND NECK TECHNIQUE: Multidetector CT imaging of the head and neck was performed using the standard protocol during bolus administration of intravenous contrast. Multiplanar CT image reconstructions and MIPs were obtained to evaluate the vascular anatomy. Carotid stenosis measurements (when applicable) are obtained utilizing NASCET criteria, using the distal internal carotid diameter as the denominator. Creatinine was obtained on site at [facility] at [address] Results: Creatinine 0.9 mg/dL. CONTRAST:  32m ISOVUE-370 IOPAMIDOL (ISOVUE-370) INJECTION 76% COMPARISON:  None. FINDINGS: CT HEAD FINDINGS Brain: No evidence of acute infarction, hemorrhage, hydrocephalus, extra-axial collection or mass lesion/mass effect. Remote lacunar infarct at the right caudate head. Brain volume is normal Vascular: See below Skull: Negative Sinuses: Negative Orbits: Negative Review of the MIP images confirms the above findings CTA NECK FINDINGS Aortic arch: Atheromatous plaque. Three vessel branching. Negative for aneurysm. Right carotid system: Diffuse atheromatous wall thickening of the common carotid. Mixed density plaque at the ICA bulb with 70% stenosis as measured on sagittal reformats. No ulceration or beading. The bifurcation is below the mandibular angle. Left carotid system: Atheromatous wall thickening of the common carotid. Mixed density plaque at the bulb without stenosis or ulceration. Negative for beading. Vertebral arteries: No  proximal subclavian stenosis. The vertebral arteries are smooth and widely patent on both sides. Skeleton: Ordinary degenerative changes. Other neck: No notable incidental finding. Upper chest: Pacer lead from the left. Review of the MIP images confirms the above findings CTA HEAD FINDINGS Anterior circulation: Mild calcified plaque along the carotid siphons. No branch occlusion, beading, or aneurysm. No flow limiting stenosis Posterior circulation: The vertebral and basilar arteries are smooth and widely patent. Mild atheromatous change to the bilateral PCA. No branch occlusion, beading, or aneurysm. Venous sinuses: Patent on both sides. Arachnoid granulations are noted. Anatomic variants: None significant Review of the MIP images confirms the above findings IMPRESSION: 1. ~70% atheromatous narrowing at the right ICA bulb. 2. No flow limiting stenosis in the left carotid or vertebral circulation. Electronically Signed   By: JMonte FantasiaM.D.   On: 03/05/2020 08:44   CUP PACEART REMOTE DEVICE CHECK  Result Date: 03/05/2020 Scheduled remote reviewed. Normal device function.  Next remote 91 days. MKathy Breach RN, CCDS, [employer]   LIPID PANEL:    Component Value Date/Time   CHOL 147 03/04/2020 1135   TRIG 244 (H) 03/04/2020 1135   HDL 37 (L) 03/04/2020 1135   CHOLHDL 4.0 03/04/2020 1135   LDLCALC 70 03/04/2020 1135   LDLDIRECT 99 06/22/2019 0937    WEIGHTS: Wt Readings from Last 3 Encounters:  03/06/20 204 lb (92.5 kg)  02/01/20 207 lb (93.9 kg)  01/23/20 210 lb 6.4 oz (95.4 kg)    VITALS: BP 126/70   Pulse 64   Ht 5' 11.5" (1.816 m)   Wt 204 lb (92.5  kg)   SpO2 96%   BMI 28.06 kg/m   EXAM: Deferred  EKG: Deferred  ASSESSMENT: 1. Mixed hyperlipidemia, goal LDL less than 70 2. History of aborted sudden cardiac death-VF arrest status post AICD 3. Mild nonobstructive coronary disease 4. Peripheral artery disease with occluded left SFA  PLAN: 1.   Rick Logan  strides with aggressive dietary modification.  LDL is up some but remains at target less than 70.  I would continue to encourage compliance with atorvastatin and his dietary modifications.  We will plan repeat lipids in a year and follow-up if he still living in [place].  Pixie Casino, MD, [facility], [place] Director of the Advanced Lipid Disorders &  Cardiovascular Risk Reduction Clinic Diplomate of the American Board of Clinical Lipidology Attending Cardiologist  Direct Dial: [phone]  Fax: [phone]  Website:  [email or web address] 03/06/2020, 10:21 AM

## 2020-03-07 ENCOUNTER — Encounter: Payer: Self-pay | Admitting: Vascular Surgery

## 2020-03-07 ENCOUNTER — Ambulatory Visit (INDEPENDENT_AMBULATORY_CARE_PROVIDER_SITE_OTHER): Payer: Self-pay | Admitting: Vascular Surgery

## 2020-03-07 ENCOUNTER — Telehealth: Payer: Self-pay | Admitting: Interventional Cardiology

## 2020-03-07 VITALS — BP 125/80 | HR 94 | Temp 98.0°F | Resp 20 | Ht 71.5 in | Wt 203.1 lb

## 2020-03-07 DIAGNOSIS — I6521 Occlusion and stenosis of right carotid artery: Secondary | ICD-10-CM

## 2020-03-07 NOTE — Telephone Encounter (Signed)
   [facility] Pre-operative Risk Assessment    HEARTCARE STAFF: - Please ensure there is not already an duplicate clearance open for this procedure. - Under Visit Info/Reason for Call, type in Other and utilize the format Clearance MM/DD/YY or Clearance TBD. Do not use dashes or single digits. - If request is for dental extraction, please clarify the # of teeth to be extracted.  Request for surgical clearance:  1. What type of surgery is being performed? Right CEA **   2. When is this surgery scheduled?  As soon as pt is cleared   3. What type of clearance is required (medical clearance vs. Pharmacy clearance to hold med vs. Both)? Medical 4.  5. Are there any medications that need to be held prior to surgery and how long?   6. Practice name and name of physician performing surgery? Dr Servando Snare   7. What is the office phone number? [phone]   7.   What is the office fax number? [phone]  8.   Anesthesia type (None, local, MAC, general) ? She did not know   Rick Logan 03/07/2020, 11:33 AM  _________________________________________________________________   (provider comments below)

## 2020-03-07 NOTE — Progress Notes (Signed)
Patient ID: Rick Logan, male   DOB: 1956-06-26, 64 y.o.   MRN: [id number]  Reason for Consult: Follow-up   Referred by Roe Coombs*  Subjective:     HPI:  Rick Logan is a 64 y.o. male with recent left femoropopliteal bypass for severe life limiting claudication.  Patient has pacemaker in place is followed by Dr. Garnette Scheuermann from cardiology.  Was found on recent carotid duplex to have high-grade right carotid artery stenosis although they were unable to definitively give a percentage less than or greater than 80%.  Patient denies any history of stroke TIA or amaurosis.  He is on aspirin and a statin drug and is followed by Dr. Rennis Golden for this.  He follows up today with CT angio to evaluate for carotid stenosis.  Past Medical History:  Diagnosis Date   AICD (automatic cardioverter/defibrillator) present    Arthritis    "joints get sore at times" (08/28/2015)   Cardiac arrest (HCC)    Vfib    Carotid artery disease (HCC)    Claudication of calf muscles (HCC)    left lower extremity   Dyspnea    Essential hypertension    Hyperlipidemia    Hypertension    Myocardial infarction (HCC)    PVD (peripheral vascular disease) (HCC)    Smoking    Systolic heart failure (HCC)    EF 30-35%   Tobacco abuse    Family History  Problem Relation Age of Onset   Cancer Father    Past Surgical History:  Procedure Laterality Date   ABDOMINAL AORTOGRAM W/LOWER EXTREMITY Bilateral 03/08/2019   Procedure: ABDOMINAL AORTOGRAM W/LOWER EXTREMITY;  Surgeon: Runell Gess, MD;  [facility]: [facility];  Service: Cardiovascular;  Laterality: Bilateral;   ABDOMINAL AORTOGRAM W/LOWER EXTREMITY Bilateral 12/10/2019   Procedure: ABDOMINAL AORTOGRAM W/LOWER EXTREMITY;  Surgeon: Maeola Harman, MD;  Location: [facility];  Service: Cardiovascular;  Laterality: Bilateral;   ABDOMINAL SURGERY     No details available   APPENDECTOMY      COLON SURGERY     EXPLORATORY LAPAROTOMY  1970's X 2   "part of small intestines; part of my bowels; appendix; 2nd OR was for adhesions"   FEMORAL-POPLITEAL BYPASS GRAFT Left 01/10/2020   Procedure: BYPASS GRAFT FEMORAL-POPLITEAL ARTERY USING GORE PROPATEN VASCULAR GRAFT;  Surgeon: Maeola Harman, MD;  Location: [facility] OR;  Service: Vascular;  Laterality: Left;   ICD IMPLANT N/A 06/06/2017   Procedure: ICD Implant;  Surgeon: Duke Salvia, MD;  Location: [facility];  Service: Cardiovascular;  Laterality: N/A;   LEFT HEART CATH AND CORONARY ANGIOGRAPHY N/A 06/03/2017   Procedure: LEFT HEART CATH AND CORONARY ANGIOGRAPHY;  Surgeon: Swazil[place], Peter M, MD;  Location: [facility];  Service: Cardiovascular;  Laterality: N/A;   PATCH ANGIOPLASTY Left 01/10/2020   Procedure: PATCH ANGIOPLASTY USING HEMASHIELD PLATINUM FINESSE PATCH;  Surgeon: Maeola Harman, MD;  Location: [facility] OR;  Service: Vascular;  Laterality: Left;   PERIPHERAL VASCULAR CATHETERIZATION N/A 08/28/2015   Procedure: Lower Extremity Angiography;  Surgeon: Runell Gess, MD;  Location: [facility] INV[facility]IVE [facility] LAB;  Service: Cardiovascular;  Laterality: N/A;   TONSILLECTOMY      Short Social History:  Social History   Tobacco Use   Smoking status: Former Smoker    Packs/day: 1.00    Years: 41.00    Pack years: 41.00    Types: Cigarettes    Quit date: 06/01/2017    Years  since quitting: 2.7   Smokeless tobacco: Never Used  Substance Use Topics   Alcohol use: Yes    No Known Allergies  Current Outpatient Medications  Medication Sig Dispense Refill   acetaminophen (TYLENOL 8 HOUR ARTHRITIS PAIN) 650 MG CR tablet Take 650 mg by mouth every 8 (eight) hours as needed for pain.      aspirin EC 81 MG tablet Take 81 mg by mouth daily.     atorvastatin (LIPITOR) 80 MG tablet Take 1 tablet (80 mg total) by mouth daily. 90 tablet 3   benazepril (LOTENSIN) 40 MG tablet Take 0.5 tablets (20 mg total) by  mouth at bedtime. Please make overdue appt with Dr. Tamala Julian before anymore refills. 1st attempt 15 tablet 0   carvedilol (COREG) 3.125 MG tablet TAKE 1 TABLET BY MOUTH TWICE A DAY WITH MEALS 180 tablet 3   Coenzyme Q10 200 MG capsule Take 1 capsule (200 mg total) by mouth daily. 90 capsule 3   No current facility-administered medications for this visit.    Review of Systems  Constitutional:  Constitutional negative. HENT: HENT negative.  Eyes: Eyes negative.  Respiratory: Respiratory negative.  Cardiovascular: Positive for leg swelling.  GI: Gastrointestinal negative.  Musculoskeletal: Positive for leg pain.  Neurological: Neurological negative. Psychiatric: Psychiatric negative.        Objective:  Objective   Vitals:   03/07/20 1109  BP: 125/80  Pulse: 94  Resp: 20  Temp: 98 F (36.7 C)  SpO2: 96%  Weight: 203 lb 1.6 oz (92.1 kg)  Height: 5' 11.5" (1.816 m)   Body mass index is 27.93 kg/m.  Physical Exam HENT:     Head: Normocephalic.     Nose:     Comments: Mask in place Eyes:     Pupils: Pupils are equal, round, and reactive to light.  Cardiovascular:     Rate and Rhythm: Normal rate.     Pulses:          Radial pulses are 2+ on the right side and 2+ on the left side.       Femoral pulses are 2+ on the right side and 2+ on the left side.      Popliteal pulses are 2+ on the left side.  Abdominal:     General: Abdomen is flat.     Palpations: Abdomen is soft. There is no mass.  Musculoskeletal:     Cervical back: Normal range of motion and neck supple.  Skin:    General: Skin is warm and dry.     Capillary Refill: Capillary refill takes less than 2 seconds.  Neurological:     General: No focal deficit present.     Mental Status: He is alert.  Psychiatric:        Mood and Affect: Mood normal.        Behavior: Behavior normal.        Thought Content: Thought content normal.        Judgment: Judgment normal.     Data: CT angio IMPRESSION: 1. ~70%  atheromatous narrowing at the right ICA bulb. 2. No flow limiting stenosis in the left carotid or vertebral circulation.     Assessment/Plan:     64 year old male with right ICA narrowing at the ball.  This is an asymptomatic lesion.  This is approximately 70% by CT angio was likely greater than 80% by carotid duplex although there was difficulty with true determination.  I have quoted the patient a risk of  approximately 10 to 15% of stroke over the next several years.  He demonstrates good understanding of this would like to have this repaired.  I discussed with him the options being stenting either from a transcriber transfemoral approach versus carotid endarterectomy.  I quoted him similar risks of stroke and coronary issues from both totaling around 5% total risk with overall risk including injury to surrounding structures around 10% from both procedures.  Patient would desire to not start any further medications including Plavix.  From the standpoint we will proceed with right carotid endarterectomy.  I will ask his cardiologist Dr. Katrinka Blazing to give clearance as he is recently done with his bypass which he tolerated from a coronary standpoint quite well.  We will then get him scheduled patient would like to wait till after June 30 for right carotid endarterectomy.     Maeola Harman MD [facility]

## 2020-03-07 NOTE — Telephone Encounter (Signed)
   Primary Cardiologist: Lesleigh Noe, MD  Chart reviewed as part of pre-operative protocol coverage. Given past medical history and time since last visit, based on ACC/AHA guidelines, Rick Logan would be at acceptable risk for the planned procedure without further cardiovascular testing.   His RCRI is a class III risk, 6.6% risk of major cardiac event.  I will route this recommendation to the requesting party via Epic fax function and remove from pre-op pool.  Please call with questions.  Thomasene Ripple. Asianna Brundage NP-C    03/07/2020, 11:46 AM [facility] [address] Office [phone] Fax [phone]

## 2020-03-10 ENCOUNTER — Other Ambulatory Visit: Payer: Self-pay

## 2020-03-11 ENCOUNTER — Telehealth: Payer: Self-pay

## 2020-03-11 NOTE — Telephone Encounter (Signed)
Pt called to request to move his Covid test from 6/28 to a later date because he will be out of town. Appointment rescheduled to 03/26/20 at 0900. Pt voiced understanding.

## 2020-03-12 ENCOUNTER — Other Ambulatory Visit: Payer: Self-pay

## 2020-03-12 ENCOUNTER — Telehealth: Payer: Self-pay

## 2020-03-12 MED ORDER — BENAZEPRIL HCL 40 MG PO TABS: 20.0000 mg | ORAL_TABLET | Freq: Every day | ORAL | 0 refills | Status: AC

## 2020-03-12 NOTE — Telephone Encounter (Signed)
Telephone call received from pts wife inquiring about recovery post carotid surgery scheduled on 03/27/20. Discussed typical post discharge instructions and advised more instructions will be provided on day of discharge. Also recommended if any particular questions, to discuss before or after procedure. Voiced understanding.

## 2020-03-19 ENCOUNTER — Other Ambulatory Visit: Payer: Self-pay | Admitting: Interventional Cardiology

## 2020-03-19 NOTE — Pre-Procedure Instructions (Signed)
[id number] - [place], [place] - [address] [place] [address] Phone: [phone] Fax: [phone]     Your procedure is scheduled on Thursday, July 1st from 07:30 AM- 09:46 AM.  Report to [facility] Entrance "A" at 05:30 A.M., and check in at the Admitting office.  Call this number if you have problems the morning of surgery:  [phone]  Call [phone] if you have any questions prior to your surgery date Monday-Friday 8am-4pm.    Remember:  Do not eat or drink after midnight the night before your surgery.     Take these medicines the morning of surgery with A SIP OF WATER: atorvastatin (LIPITOR) carvedilol (COREG)  IF NEEDED: acetaminophen (TYLENOL 8 HOUR ARTHRITIS PAIN)   *Follow your surgeon's instructions on when to stop Aspirin.  If no instructions were given by your surgeon then you will need to call the office to get those instructions.    As of today, STOP taking any Aleve, Naproxen, Ibuprofen, Motrin, Advil, Goody's, BC's, all herbal medications, fish oil, and all vitamins.    The Morning of Surgery:                  Do not wear jewelry.            Do not wear lotions, powders, colognes, or deodorant.            Men may shave face and neck.            Do not bring valuables to the hospital.            [facility] is not responsible for any belongings or valuables.  Do NOT Smoke (Tobacco/Vapping) or drink Alcohol 24 hours prior to your procedure.  If you use a CPAP at night, you may bring all equipment for your overnight stay.   Contacts, glasses, dentures or bridgework may not be worn into surgery.      For patients admitted to the hospital, discharge time will be determined by your treatment team.   Patients discharged the day of surgery will not be allowed to drive home, and someone needs to stay with them for 24 hours.    Special instructions:   [facility]- Preparing  For Surgery  Before surgery, you can play an important role. Because skin is not sterile, your skin needs to be as free of germs as possible. You can reduce the number of germs on your skin by washing with CHG (chlorahexidine gluconate) Soap before surgery.  CHG is an antiseptic cleaner which kills germs and bonds with the skin to continue killing germs even after washing.    Oral Hygiene is also important to reduce your risk of infection.  Remember - BRUSH YOUR TEETH THE MORNING OF SURGERY WITH YOUR REGULAR TOOTHPASTE  Please do not use if you have an allergy to CHG or antibacterial soaps. If your skin becomes reddened/irritated stop using the CHG.  Do not shave (including legs and underarms) for at least 48 hours prior to first CHG shower. It is OK to shave your face.  Please follow these instructions carefully.   1. Shower the NIGHT BEFORE SURGERY and the MORNING OF SURGERY with CHG Soap.   2. If you chose to wash your hair, wash your hair first as usual with your normal shampoo.  3. After you shampoo, rinse your hair and body thoroughly to remove the shampoo.  4. Use CHG as you would any other liquid soap. You can apply CHG directly to the skin and wash gently with a scrungie or a clean washcloth.   5. Apply the CHG Soap to your body ONLY FROM THE NECK DOWN.  Do not use on open wounds or open sores. Avoid contact with your eyes, ears, mouth and genitals (private parts). Wash Face and genitals (private parts)  with your normal soap.   6. Wash thoroughly, paying special attention to the area where your surgery will be performed.  7. Thoroughly rinse your body with warm water from the neck down.  8. DO NOT shower/wash with your normal soap after using and rinsing off the CHG Soap.  9. Pat yourself dry with a CLEAN TOWEL.  10. Wear CLEAN PAJAMAS to bed the night before surgery, wear comfortable clothes the morning of surgery  11. Place CLEAN SHEETS on your bed the night of your first  shower and DO NOT SLEEP WITH PETS.   Day of Surgery: Shower with CHG Soap.  Do not apply any deodorants/lotions.  Please wear clean clothes to the hospital/surgery center.   Remember to brush your teeth WITH YOUR REGULAR TOOTHPASTE.   Please read over the following fact sheets that you were given.

## 2020-03-19 NOTE — Progress Notes (Signed)
Emailed device clinic requesting Device orders. Emailed Jana Half, [employer] Device Rep. Flow Cordinator Cyndia Diver, RN cc'd.

## 2020-03-20 ENCOUNTER — Other Ambulatory Visit: Payer: Self-pay

## 2020-03-20 ENCOUNTER — Encounter (HOSPITAL_COMMUNITY)
Admission: RE | Admit: 2020-03-20 | Discharge: 2020-03-20 | Disposition: A | Discharge status: Home / Self Care | Payer: Medicare Other | Source: Ambulatory Visit | Attending: Vascular Surgery | Admitting: Vascular Surgery

## 2020-03-20 ENCOUNTER — Encounter (HOSPITAL_COMMUNITY): Payer: Self-pay

## 2020-03-20 ENCOUNTER — Encounter: Payer: Self-pay | Admitting: Internal Medicine

## 2020-03-20 DIAGNOSIS — K219 Gastro-esophageal reflux disease without esophagitis: Secondary | ICD-10-CM | POA: Diagnosis not present

## 2020-03-20 DIAGNOSIS — M1990 Unspecified osteoarthritis, unspecified site: Secondary | ICD-10-CM | POA: Insufficient documentation

## 2020-03-20 DIAGNOSIS — Z01818 Encounter for other preprocedural examination: Secondary | ICD-10-CM | POA: Insufficient documentation

## 2020-03-20 DIAGNOSIS — I110 Hypertensive heart disease with heart failure: Secondary | ICD-10-CM | POA: Diagnosis not present

## 2020-03-20 DIAGNOSIS — E785 Hyperlipidemia, unspecified: Secondary | ICD-10-CM | POA: Diagnosis not present

## 2020-03-20 DIAGNOSIS — I2510 Atherosclerotic heart disease of native coronary artery without angina pectoris: Secondary | ICD-10-CM | POA: Diagnosis not present

## 2020-03-20 DIAGNOSIS — I5022 Chronic systolic (congestive) heart failure: Secondary | ICD-10-CM | POA: Insufficient documentation

## 2020-03-20 DIAGNOSIS — Z79899 Other long term (current) drug therapy: Secondary | ICD-10-CM | POA: Diagnosis not present

## 2020-03-20 DIAGNOSIS — I739 Peripheral vascular disease, unspecified: Secondary | ICD-10-CM | POA: Diagnosis not present

## 2020-03-20 DIAGNOSIS — Z7982 Long term (current) use of aspirin: Secondary | ICD-10-CM | POA: Insufficient documentation

## 2020-03-20 DIAGNOSIS — Z95810 Presence of automatic (implantable) cardiac defibrillator: Secondary | ICD-10-CM | POA: Insufficient documentation

## 2020-03-20 DIAGNOSIS — I252 Old myocardial infarction: Secondary | ICD-10-CM | POA: Insufficient documentation

## 2020-03-20 HISTORY — DX: Gastro-esophageal reflux disease without esophagitis: K21.9

## 2020-03-20 LAB — CBC
HCT: 48.2 % (ref 39.0–52.0)
Hemoglobin: 15.8 g/dL (ref 13.0–17.0)
MCH: 30.2 pg (ref 26.0–34.0)
MCHC: 32.8 g/dL (ref 30.0–36.0)
MCV: 92.2 fL (ref 80.0–100.0)
Platelets: 251 10*3/uL (ref 150–400)
RBC: 5.23 MIL/uL (ref 4.22–5.81)
RDW: 12.4 % (ref 11.5–15.5)
WBC: 7.3 10*3/uL (ref 4.0–10.5)
nRBC: 0 % (ref 0.0–0.2)

## 2020-03-20 LAB — URINALYSIS, ROUTINE W REFLEX MICROSCOPIC
Bilirubin Urine: NEGATIVE
Glucose, UA: NEGATIVE mg/dL
Hgb urine dipstick: NEGATIVE
Ketones, ur: NEGATIVE mg/dL
Leukocytes,Ua: NEGATIVE
Nitrite: NEGATIVE
Protein, ur: NEGATIVE mg/dL
Specific Gravity, Urine: 1.017 (ref 1.005–1.030)
pH: 5 (ref 5.0–8.0)

## 2020-03-20 LAB — COMPREHENSIVE METABOLIC PANEL
ALT: 38 U/L (ref 0–44)
AST: 30 U/L (ref 15–41)
Albumin: 3.9 g/dL (ref 3.5–5.0)
Alkaline Phosphatase: 108 U/L (ref 38–126)
Anion gap: 10 (ref 5–15)
BUN: 10 mg/dL (ref 8–23)
CO2: 25 mmol/L (ref 22–32)
Calcium: 9.5 mg/dL (ref 8.9–10.3)
Chloride: 104 mmol/L (ref 98–111)
Creatinine, Ser: 0.9 mg/dL (ref 0.61–1.24)
GFR calc Af Amer: >60 mL/min (ref >60)
GFR calc non Af Amer: >60 mL/min (ref >60)
Glucose, Bld: 99 mg/dL (ref 70–99)
Potassium: 4.3 mmol/L (ref 3.5–5.1)
Sodium: 139 mmol/L (ref 135–145)
Total Bilirubin: 0.7 mg/dL (ref 0.3–1.2)
Total Protein: 6.9 g/dL (ref 6.5–8.1)

## 2020-03-20 LAB — PROTIME-INR
INR: 1.1 (ref 0.8–1.2)
Prothrombin Time: 13.4 seconds (ref 11.4–15.2)

## 2020-03-20 LAB — TYPE AND SCREEN
ABO/RH(D): O POS
Antibody Screen: NEGATIVE

## 2020-03-20 LAB — APTT: aPTT: 28 seconds (ref 24–36)

## 2020-03-20 LAB — SURGICAL PCR SCREEN
MRSA, PCR: NEGATIVE
Staphylococcus aureus: NEGATIVE

## 2020-03-20 NOTE — Progress Notes (Signed)
PERIOPERATIVE PRESCRIPTION FOR IMPLANTED CARDIAC DEVICE PROGRAMMING  Patient Information: Name:  Rick Logan  DOB:  [birth year]  MRN:  [id number]    Planned Procedure: RIGHT CAROTID ENDARTERECTOMY  Surgeon: Dr. Lemar Livings  Date of Procedure: 03/27/20  Cautery will be used.  Position during surgery:    Please send documentation back to:  [facility] (Fax # [phone])   Inda Coke, RN  03/19/2020 5:54 PM   Device Information:  Clinic EP Physician:  Sherryl Manges, MD   Device Type:  Defibrillator Manufacturer and Phone #:  Medtronic: [phone] Pacemaker Dependent?:  No. Date of Last Device Check:  03/05/2020 Normal Device Function?:  Yes.    Electrophysiologist's Recommendations:   Have magnet available.  Provide continuous ECG monitoring when magnet is used or reprogramming is to be performed.   Procedure will likely interfere with device function.  Device should be programmed:  Tachy therapies disabled   Have industry rep present for procedure  Per Device Clinic Standing Orders, Linton Ham, RN  7:47 AM 03/20/2020

## 2020-03-20 NOTE — Progress Notes (Signed)
PCP - [facility] [place] Cardiologist - Dr. Katrinka Blazing, Dr. Graciela Husbands  PPM/ICD -  ICD Device Orders - sent Rep Notified - yes: Medtronic  Chest x-ray - na EKG - 01/23/20 Stress Test - na ECHO - 8/20 Cardiac Cath - 9/18  Sleep Study - na CPAP -   Fasting Blood Sugar - na Checks Blood Sugar _____ times a day  Blood Thinner Instructions: na Aspirin Instructions: continue   ERAS Protcol -na   COVID TEST- 6/30   Anesthesia review:   Patient denies shortness of breath, fever, cough and chest pain at PAT appointment   All instructions explained to the patient, with a verbal understanding of the material. Patient agrees to go over the instructions while at home for a better understanding. Patient also instructed to self quarantine after being tested for COVID-19. The opportunity to ask questions was provided.

## 2020-03-21 NOTE — Anesthesia Preprocedure Evaluation (Addendum)
Anesthesia Evaluation  Patient identified by MRN, date of birth, ID band Patient awake    Reviewed: Allergy & Precautions, NPO status , Patient's Chart, lab work & pertinent test results  Airway Mallampati: I  TM Distance: >3 FB Neck ROM: Full    Dental   Pulmonary former smoker,    Pulmonary exam normal        Cardiovascular hypertension, Pt. on medications + Past MI  Normal cardiovascular exam+ Cardiac Defibrillator   2018 - Out of Hospital arrest. AICD in place   Neuro/Psych    GI/Hepatic GERD  Medicated and Controlled,  Endo/Other    Renal/GU      Musculoskeletal   Abdominal   Peds  Hematology   Anesthesia Other Findings   Reproductive/Obstetrics                            Anesthesia Physical Anesthesia Plan  ASA: III  Anesthesia Plan: General   Post-op Pain Management:    Induction: Intravenous  PONV Risk Score and Plan: 2  Airway Management Planned: Oral ETT  Additional Equipment: Arterial line  Intra-op Plan:   Post-operative Plan: Extubation in OR  Informed Consent: I have reviewed the patients History and Physical, chart, labs and discussed the procedure including the risks, benefits and alternatives for the proposed anesthesia with the patient or authorized representative who has indicated his/her understanding and acceptance.       Plan Discussed with: CRNA and Surgeon  Anesthesia Plan Comments: (PAT note written 03/21/2020 by Shonna Chock, PA-C. )       Anesthesia Quick Evaluation

## 2020-03-21 NOTE — Progress Notes (Signed)
Anesthesia Chart Review:  Case: [id number] Date/Time: 03/27/20 0715   Procedure: ENDARTERECTOMY CAROTID (Right Neck)   Anesthesia type: Choice   Pre-op diagnosis: PERIPHERAL VASCULAR DISEASE   [facility] RO[facility] 12 / [facility]   Surgeons: Maeola Harman, MD      DISCUSSION: Patient is a 64 year old male scheduled for the above procedure.  History includes former smoker (quit 06/01/17), HTN, PVD/claudication (left SFA atherectomy/angioplasty 08/28/15; s/p left CFA/SFA endarterectomies, left CFA-above knee popliteal artery BPG with 6 mm ringed PTFE 01/10/20), HLD, CAD (witnessed syncope 06/01/17, aborted Vfib arrest by EMS, s/p CPR x 20 min, defib x4, epi x2 with ROSC, non-obstructive CAD, EF 35-45%, consider Takotsubo Cardiomyopathy, s/p Medtronic ICD for secondary prevention 06/06/17), chronic systolic CHF, carotid artery stenosis, dyspnea, GERD.   Preoperative cardiology input outlined on 03/07/20 by Edd Fabian, NP, "...based on ACC/AHA guidelines,Rick R Klendworthwould be at acceptable risk for the planned procedure without further cardiovascular testing. His RCRI is a class III risk, 6.6% risk of major cardiac event." He denied any new cardiopulmonary symptoms at his PAT RN visit.   ICD perioperative orders: Device Type:  Biochemist, clinical and Phone #:  Med[employer]onic: [phone] Pacemaker Dependent?:  No. Date of Last Device Check:  03/05/2020     Normal Device Function?:  Yes.   Electrophysiologist's Recommendations:  Have magnet available.  Provide continuous ECG monitoring when magnet is used or reprogramming is to be performed.   Procedure will likely interfere with device function.  Device should be programmed:  Tachy therapies disabled   Have industry rep present for procedure  2nd Pfizer COVID-19 vaccine on 12/26/19. 03/26/20 presurgical COVID-19 test is pending.  Anesthesia team to evaluate on the day of surgery.    VS: BP 131/71   Pulse 61   Temp 37 C  (Oral)   Resp 17   Ht 5' 11.5" (1.816 m)   Wt 92.1 kg   SpO2 98%   BMI 27.93 kg/m     PROVIDERS: [place] is where he received primary care Verdis Prime, MS is primary cardiologist. Last evaluation 02/06/19 with Nada Boozer, NP.  Chrystie Nose, MD is Lipid cardiologist. Last evaluation 03/06/20.  Nanetta Batty, MD is [facility] cardiologist. Last evaluation 07/12/19.  Sherryl Manges, MD is EP cardiologist. Last evaluation 01/23/20 with Maxine Glenn, PA-C.   LABS: Labs reviewed: Acceptable for surgery. (all labs ordered are listed, but only abnormal results are displayed)  Labs Reviewed  SURGICAL PCR SCREEN  APTT  CBC  COMPREHENSIVE METABOLIC PANEL  PROTIME-INR  URINALYSIS, ROUTINE W REFLEX MICROSCOPIC  TYPE AND SCREEN     IMAGES: CTA head/neck 03/04/20: IMPRESSION: 1. ~70% atheromatous narrowing at the right ICA bulb. 2. No flow limiting stenosis in the left carotid or vertebral circulation.   EKG: 01/23/20 ([facility]): NSR. LAD. Cannot rule out inferior infarct (old). Anterolateral T wave abnormality, probably non-specific (old).   CV: Carotid US 01/31/20: Summary:  - Right Carotid: Velocities in the right ICA are consistent with a 60-79% stenosis. There was only one velocity obtained to suggest a low 80-99% stenosis with a EDV of 116 cm/sec.  - Left Carotid: Velocities in the left ICA are consistent with a 1-39% stenosis. The LICA velocities remain within normal range and have increased compared to the prior exam.  - Vertebrals: Bilateral vertebral arteries demonstrate antegrade flow.  - Subclavians: Normal flow hemodynamics were seen in bilateral subclavian arteries.    Echo 05/11/19: IMPRESSIONS  1. The left ventricle  has normal systolic function, with an ejection  fraction of 55-60%. The cavity size was normal. There is mildly increased  left ventricular wall thickness. Left ventricular diastolic Doppler  parameters are  consistent with impaired  relaxation.  2. The right ventricle has normal systolic function. The cavity was  normal.  3. The mitral valve is grossly normal.  4. The tricuspid valve is grossly normal.  5. The aortic valve is tricuspid. Mild calcification of the aortic valve.  No stenosis of the aortic valve.  6. The aorta is normal unless otherwise noted.  7. Normal LV systolic function; mild LVH; mild diastolic dysfunction.  (Comparison: post-MI LVEF 30-35% 06/01/17; 60-65% 08/30/17)    Cardiac cath 06/03/17:  Mid RCA lesion, 25 %stenosed.  Prox LAD to Mid LAD lesion, 20 %stenosed.  There is moderate left ventricular systolic dysfunction.  LV end diastolic pressure is normal.  The left ventricular ejection fraction is 35-45% by visual estimate. 1. Minor nonobstructive CAD 2. Moderate LV dysfunction. There a segmental wall motion abnormalities involving the mid ventricular segments and inferoapex. Consider Takotsubo syndrome. 3. Normal LVEDP. Plan: EP evaluation for consideration of ICD   Past Medical History:  Diagnosis Date  . AICD (automatic cardioverter/defibrillator) present   . Arthritis    "joints get sore at times" (08/28/2015)  . Cardiac arrest (HCC)    Vfib   . Carotid artery disease (HCC)   . Claudication of calf muscles (HCC)    left lower extremity  . Essential hypertension   . GERD (gastroesophageal reflux disease)   . Hyperlipidemia   . Hypertension   . Myocardial infarction (HCC)   . PVD (peripheral vascular disease) (HCC)   . Smoking   . Systolic heart failure (HCC)    EF 30-35%  . Tobacco abuse     Past Surgical History:  Procedure Laterality Date  . ABDOMINAL AORTOGRAM W/LOWER EXTREMITY Bilateral 03/08/2019   Procedure: ABDOMINAL AORTOGRAM W/LOWER EXTREMITY;  Surgeon: Runell Gess, MD;  Location: [facility] INVASIVE CV LAB;  Service: Cardiovascular;  Laterality: Bilateral;  . ABDOMINAL AORTOGRAM W/LOWER EXTREMITY Bilateral 12/10/2019    Procedure: ABDOMINAL AORTOGRAM W/LOWER EXTREMITY;  Surgeon: Maeola Harman, MD;  Location: [facility] INVAS[facility] CV LAB;  Service: Cardiovascular;  Laterality: Bilateral;  . ABDOMINAL SURGERY     No details available  . APPENDECTOMY    . COLON SURGERY    . EXPLORATORY LAPAROTOMY  1970's X 2   "part of small intestines; part of my bowels; appendix; 2nd OR was for adhesions"  . FEMORAL-POPLITEAL BYPASS GRAFT Left 01/10/2020   Procedure: BYPASS GRAFT FEMORAL-POPLITEAL ARTERY USING GORE PROPATEN VASCULAR GRAFT;  Surgeon: Maeola Harman, MD;  Location: [facility] OR;  Service: Vascular;  Laterality: Left;  . ICD IMPLANT N/A 06/06/2017   Procedure: ICD Implant;  Surgeon: Duke Salvia, MD;  Location: [facility] INVASIVE CV LAB;  Service: Cardiovascular;  Laterality: N/A;  . LEFT HEART CATH AND CORONARY ANGIOGRAPHY N/A 06/03/2017   Procedure: LEFT HEART CATH AND CORONARY ANGIOGRAPHY;  Surgeon: [place], Peter M, MD;  Location: [facility] INVASIVE CV LAB;  Service: Cardiovascular;  Laterality: N/A;  . PATCH ANGIOPLASTY Left 01/10/2020   Procedure: PATCH ANGIOPLASTY USING HEMASHIELD PLATINUM FINESSE PATCH;  Surgeon: Maeola Harman, MD;  Location: [facility] OR;  Service: Vascular;  Laterality: Left;  . PERIPHERAL VASCULAR CATHETERIZATION N/A 08/28/2015   Procedure: Lower Extremity Angiography;  Surgeon: Runell Gess, MD;  Location: [facility] INVASIVE CV LAB;  Service: Cardiovascular;  Laterality: N/A;  . TONSILLECTOMY  MEDICATIONS: . acetaminophen (TYLENOL 8 HOUR ARTHRITIS PAIN) 650 MG CR tablet  . aspirin EC 81 MG tablet  . atorvastatin (LIPITOR) 80 MG tablet  . benazepril (LOTENSIN) 40 MG tablet  . carvedilol (COREG) 3.125 MG tablet  . Coenzyme Q10 200 MG capsule   No current facility-administered medications for this encounter.    Myra Gianotti, PA-C Surgical Short Stay/Anesthesiology [facility] Phone [phone] [facility] Phone [phone] 03/21/2020 9:37 AM

## 2020-03-24 ENCOUNTER — Other Ambulatory Visit (HOSPITAL_COMMUNITY): Payer: Medicare Other

## 2020-03-26 ENCOUNTER — Other Ambulatory Visit (HOSPITAL_COMMUNITY)
Admission: RE | Admit: 2020-03-26 | Discharge: 2020-03-26 | Disposition: A | Discharge status: Home / Self Care | Payer: Medicare Other | Source: Ambulatory Visit | Attending: Vascular Surgery | Admitting: Vascular Surgery

## 2020-03-26 LAB — SARS CORONAVIRUS 2 (TAT 6-24 HRS): SARS Coronavirus 2: NEGATIVE

## 2020-03-27 ENCOUNTER — Encounter (HOSPITAL_COMMUNITY)
Admission: RE | Disposition: A | Discharge status: Home / Self Care | Payer: Self-pay | Source: Home / Self Care | Attending: Vascular Surgery

## 2020-03-27 ENCOUNTER — Inpatient Hospital Stay (HOSPITAL_COMMUNITY): Payer: Medicare Other

## 2020-03-27 ENCOUNTER — Inpatient Hospital Stay (HOSPITAL_COMMUNITY)
Admission: RE | Admit: 2020-03-27 | Discharge: 2020-03-28 | DRG: 038 | Disposition: A | Discharge status: Home / Self Care | Payer: Medicare Other | Attending: Vascular Surgery | Admitting: Vascular Surgery

## 2020-03-27 ENCOUNTER — Encounter (HOSPITAL_COMMUNITY): Payer: Self-pay | Admitting: Vascular Surgery

## 2020-03-27 ENCOUNTER — Other Ambulatory Visit: Payer: Self-pay

## 2020-03-27 ENCOUNTER — Inpatient Hospital Stay (HOSPITAL_COMMUNITY): Payer: Medicare Other | Admitting: Vascular Surgery

## 2020-03-27 DIAGNOSIS — Z95810 Presence of automatic (implantable) cardiac defibrillator: Secondary | ICD-10-CM | POA: Diagnosis not present

## 2020-03-27 DIAGNOSIS — Z8674 Personal history of sudden cardiac arrest: Secondary | ICD-10-CM

## 2020-03-27 DIAGNOSIS — K219 Gastro-esophageal reflux disease without esophagitis: Secondary | ICD-10-CM | POA: Diagnosis present

## 2020-03-27 DIAGNOSIS — M1990 Unspecified osteoarthritis, unspecified site: Secondary | ICD-10-CM | POA: Diagnosis present

## 2020-03-27 DIAGNOSIS — I6521 Occlusion and stenosis of right carotid artery: Principal | ICD-10-CM | POA: Diagnosis present

## 2020-03-27 DIAGNOSIS — I110 Hypertensive heart disease with heart failure: Secondary | ICD-10-CM | POA: Diagnosis present

## 2020-03-27 DIAGNOSIS — I739 Peripheral vascular disease, unspecified: Secondary | ICD-10-CM | POA: Diagnosis present

## 2020-03-27 DIAGNOSIS — Z8701 Personal history of pneumonia (recurrent): Secondary | ICD-10-CM | POA: Diagnosis not present

## 2020-03-27 DIAGNOSIS — Z20822 Contact with and (suspected) exposure to covid-19: Secondary | ICD-10-CM | POA: Diagnosis present

## 2020-03-27 DIAGNOSIS — I5022 Chronic systolic (congestive) heart failure: Secondary | ICD-10-CM | POA: Diagnosis present

## 2020-03-27 DIAGNOSIS — E785 Hyperlipidemia, unspecified: Secondary | ICD-10-CM | POA: Diagnosis present

## 2020-03-27 DIAGNOSIS — I252 Old myocardial infarction: Secondary | ICD-10-CM | POA: Diagnosis not present

## 2020-03-27 HISTORY — PX: PATCH ANGIOPLASTY: SHX6230

## 2020-03-27 HISTORY — PX: ENDARTERECTOMY: SHX5162

## 2020-03-27 LAB — CREATININE, SERUM
Creatinine, Ser: 0.91 mg/dL (ref 0.61–1.24)
GFR calc Af Amer: >60 mL/min (ref >60)
GFR calc non Af Amer: >60 mL/min (ref >60)

## 2020-03-27 LAB — CBC
HCT: 41.3 % (ref 39.0–52.0)
Hemoglobin: 13.7 g/dL (ref 13.0–17.0)
MCH: 30.1 pg (ref 26.0–34.0)
MCHC: 33.2 g/dL (ref 30.0–36.0)
MCV: 90.8 fL (ref 80.0–100.0)
Platelets: 187 10*3/uL (ref 150–400)
RBC: 4.55 MIL/uL (ref 4.22–5.81)
RDW: 12.5 % (ref 11.5–15.5)
WBC: 9 10*3/uL (ref 4.0–10.5)
nRBC: 0 % (ref 0.0–0.2)

## 2020-03-27 LAB — POCT ACTIVATED CLOTTING TIME: Activated Clotting Time: 257 seconds

## 2020-03-27 SURGERY — ENDARTERECTOMY, CAROTID
Anesthesia: General | Site: Neck | Laterality: Right

## 2020-03-27 MED ORDER — FENTANYL CITRATE (PF) 250 MCG/5ML IJ SOLN
INTRAMUSCULAR | Status: AC
  Filled 2020-03-27: qty 5

## 2020-03-27 MED ORDER — EPHEDRINE 5 MG/ML INJ
INTRAVENOUS | Status: AC
  Filled 2020-03-27: qty 10

## 2020-03-27 MED ORDER — ALUM & MAG HYDROXIDE-SIMETH 200-200-20 MG/5ML PO SUSP: 15.0000 mL | ORAL | Status: DC | PRN

## 2020-03-27 MED ORDER — GUAIFENESIN-DM 100-10 MG/5ML PO SYRP: 15.0000 mL | ORAL_SOLUTION | ORAL | Status: DC | PRN

## 2020-03-27 MED ORDER — SENNOSIDES-DOCUSATE SODIUM 8.6-50 MG PO TABS: 1.0000 | ORAL_TABLET | Freq: Every evening | ORAL | Status: DC | PRN

## 2020-03-27 MED ORDER — LACTATED RINGERS IV SOLN: INTRAVENOUS | Status: DC

## 2020-03-27 MED ORDER — 0.9 % SODIUM CHLORIDE (POUR BTL) OPTIME
TOPICAL | Status: DC | PRN
  Administered 2020-03-27: 2000 mL
  Administered 2020-03-27: 1000 mL

## 2020-03-27 MED ORDER — OXYCODONE HCL 5 MG PO TABS
ORAL_TABLET | ORAL | Status: AC
  Filled 2020-03-27: qty 2

## 2020-03-27 MED ORDER — PROPOFOL 10 MG/ML IV BOLUS
INTRAVENOUS | Status: DC | PRN
  Administered 2020-03-27: 130 mg via INTRAVENOUS

## 2020-03-27 MED ORDER — POTASSIUM CHLORIDE CRYS ER 20 MEQ PO TBCR: 20.0000 meq | EXTENDED_RELEASE_TABLET | Freq: Every day | ORAL | Status: DC | PRN

## 2020-03-27 MED ORDER — CHLORHEXIDINE GLUCONATE 4 % EX LIQD: 60.0000 mL | Freq: Once | CUTANEOUS | Status: DC

## 2020-03-27 MED ORDER — PROPOFOL 10 MG/ML IV BOLUS
INTRAVENOUS | Status: AC
  Filled 2020-03-27: qty 20

## 2020-03-27 MED ORDER — HYDROMORPHONE HCL 1 MG/ML IJ SOLN
INTRAMUSCULAR | Status: AC
  Filled 2020-03-27: qty 1

## 2020-03-27 MED ORDER — ORAL CARE MOUTH RINSE: 15.0000 mL | Freq: Once | OROMUCOSAL | Status: AC

## 2020-03-27 MED ORDER — OXYCODONE HCL 5 MG PO TABS
5.0000 mg | ORAL_TABLET | ORAL | Status: DC | PRN
  Administered 2020-03-27 (×2): 10 mg via ORAL
  Filled 2020-03-27: qty 2

## 2020-03-27 MED ORDER — MAGNESIUM SULFATE 2 GM/50ML IV SOLN: 2.0000 g | Freq: Every day | INTRAVENOUS | Status: DC | PRN

## 2020-03-27 MED ORDER — DOCUSATE SODIUM 100 MG PO CAPS
100.0000 mg | ORAL_CAPSULE | Freq: Every day | ORAL | Status: DC
  Administered 2020-03-28: 100 mg via ORAL
  Filled 2020-03-27: qty 1

## 2020-03-27 MED ORDER — LABETALOL HCL 5 MG/ML IV SOLN
INTRAVENOUS | Status: AC
  Filled 2020-03-27: qty 4

## 2020-03-27 MED ORDER — PHENOL 1.4 % MT LIQD: 1.0000 | OROMUCOSAL | Status: DC | PRN

## 2020-03-27 MED ORDER — LABETALOL HCL 5 MG/ML IV SOLN
10.0000 mg | INTRAVENOUS | Status: DC | PRN
  Administered 2020-03-27: 10 mg via INTRAVENOUS

## 2020-03-27 MED ORDER — ONDANSETRON HCL 4 MG/2ML IJ SOLN: 4.0000 mg | Freq: Four times a day (QID) | INTRAMUSCULAR | Status: DC | PRN

## 2020-03-27 MED ORDER — ONDANSETRON HCL 4 MG/2ML IJ SOLN: 4.0000 mg | Freq: Once | INTRAMUSCULAR | Status: DC | PRN

## 2020-03-27 MED ORDER — LIDOCAINE 2% (20 MG/ML) 5 ML SYRINGE
INTRAMUSCULAR | Status: DC | PRN
  Administered 2020-03-27: 100 mg via INTRAVENOUS

## 2020-03-27 MED ORDER — PANTOPRAZOLE SODIUM 40 MG PO TBEC
40.0000 mg | DELAYED_RELEASE_TABLET | Freq: Every day | ORAL | Status: DC
  Administered 2020-03-27 – 2020-03-28 (×2): 40 mg via ORAL
  Filled 2020-03-27 (×2): qty 1

## 2020-03-27 MED ORDER — HEPARIN SODIUM (PORCINE) 1000 UNIT/ML IJ SOLN
INTRAMUSCULAR | Status: DC | PRN
  Administered 2020-03-27: 10000 [IU] via INTRAVENOUS

## 2020-03-27 MED ORDER — MIDAZOLAM HCL 2 MG/2ML IJ SOLN
INTRAMUSCULAR | Status: AC
  Filled 2020-03-27: qty 2

## 2020-03-27 MED ORDER — PROTAMINE SULFATE 10 MG/ML IV SOLN
INTRAVENOUS | Status: DC | PRN
  Administered 2020-03-27: 10 mg via INTRAVENOUS
  Administered 2020-03-27: 40 mg via INTRAVENOUS

## 2020-03-27 MED ORDER — LIDOCAINE HCL (PF) 1 % IJ SOLN
INTRAMUSCULAR | Status: AC
  Filled 2020-03-27: qty 5

## 2020-03-27 MED ORDER — MORPHINE SULFATE (PF) 2 MG/ML IV SOLN: 2.0000 mg | INTRAVENOUS | Status: DC | PRN

## 2020-03-27 MED ORDER — CEFAZOLIN SODIUM-DEXTROSE 2-4 GM/100ML-% IV SOLN
2.0000 g | INTRAVENOUS | Status: AC
  Administered 2020-03-27: 2 g via INTRAVENOUS
  Filled 2020-03-27: qty 100

## 2020-03-27 MED ORDER — DEXAMETHASONE SODIUM PHOSPHATE 10 MG/ML IJ SOLN
INTRAMUSCULAR | Status: DC | PRN
Start: 2020-03-27 — End: 2020-03-27
  Administered 2020-03-27: 4 mg via INTRAVENOUS

## 2020-03-27 MED ORDER — SODIUM CHLORIDE 0.9 % IV SOLN
0.0125 ug/kg/min | INTRAVENOUS | Status: AC
  Administered 2020-03-27: .1 ug/kg/min via INTRAVENOUS
  Filled 2020-03-27: qty 2000

## 2020-03-27 MED ORDER — SODIUM CHLORIDE 0.9 % IV SOLN
INTRAVENOUS | Status: AC
  Filled 2020-03-27: qty 1.2

## 2020-03-27 MED ORDER — ROCURONIUM BROMIDE 10 MG/ML (PF) SYRINGE
PREFILLED_SYRINGE | INTRAVENOUS | Status: AC
  Filled 2020-03-27: qty 10

## 2020-03-27 MED ORDER — SODIUM CHLORIDE 0.9 % IV SOLN
INTRAVENOUS | Status: DC | PRN
  Administered 2020-03-27: 500 mL

## 2020-03-27 MED ORDER — ACETAMINOPHEN 650 MG RE SUPP: 325.0000 mg | RECTAL | Status: DC | PRN

## 2020-03-27 MED ORDER — LIDOCAINE 2% (20 MG/ML) 5 ML SYRINGE
INTRAMUSCULAR | Status: AC
  Filled 2020-03-27: qty 5

## 2020-03-27 MED ORDER — HEPARIN SODIUM (PORCINE) 5000 UNIT/ML IJ SOLN
5000.0000 [IU] | Freq: Three times a day (TID) | INTRAMUSCULAR | Status: DC
  Administered 2020-03-27 – 2020-03-28 (×2): 5000 [IU] via SUBCUTANEOUS
  Filled 2020-03-27 (×2): qty 1

## 2020-03-27 MED ORDER — HYDRALAZINE HCL 20 MG/ML IJ SOLN: 5.0000 mg | INTRAMUSCULAR | Status: DC | PRN

## 2020-03-27 MED ORDER — ROCURONIUM BROMIDE 10 MG/ML (PF) SYRINGE
PREFILLED_SYRINGE | INTRAVENOUS | Status: DC | PRN
  Administered 2020-03-27: 100 mg via INTRAVENOUS

## 2020-03-27 MED ORDER — ONDANSETRON HCL 4 MG/2ML IJ SOLN
INTRAMUSCULAR | Status: AC
  Filled 2020-03-27: qty 2

## 2020-03-27 MED ORDER — SODIUM CHLORIDE 0.9 % IV SOLN: 500.0000 mL | Freq: Once | INTRAVENOUS | Status: DC | PRN

## 2020-03-27 MED ORDER — SODIUM CHLORIDE 0.9 % IV SOLN: INTRAVENOUS | Status: DC

## 2020-03-27 MED ORDER — GLYCOPYRROLATE PF 0.2 MG/ML IJ SOSY
PREFILLED_SYRINGE | INTRAMUSCULAR | Status: AC
  Filled 2020-03-27: qty 1

## 2020-03-27 MED ORDER — GLYCOPYRROLATE 0.2 MG/ML IJ SOLN
INTRAMUSCULAR | Status: DC | PRN
Start: 2020-03-27 — End: 2020-03-27
  Administered 2020-03-27: .2 mg via INTRAVENOUS

## 2020-03-27 MED ORDER — ASPIRIN EC 81 MG PO TBEC
81.0000 mg | DELAYED_RELEASE_TABLET | Freq: Every day | ORAL | Status: DC
  Administered 2020-03-28: 81 mg via ORAL
  Filled 2020-03-27: qty 1

## 2020-03-27 MED ORDER — CEFAZOLIN SODIUM-DEXTROSE 2-4 GM/100ML-% IV SOLN
2.0000 g | Freq: Three times a day (TID) | INTRAVENOUS | Status: AC
  Administered 2020-03-27 (×2): 2 g via INTRAVENOUS
  Filled 2020-03-27 (×2): qty 100

## 2020-03-27 MED ORDER — METOPROLOL TARTRATE 5 MG/5ML IV SOLN: 2.0000 mg | INTRAVENOUS | Status: DC | PRN

## 2020-03-27 MED ORDER — SUGAMMADEX SODIUM 200 MG/2ML IV SOLN
INTRAVENOUS | Status: DC | PRN
Start: 2020-03-27 — End: 2020-03-27
  Administered 2020-03-27: 200 mg via INTRAVENOUS

## 2020-03-27 MED ORDER — HEMOSTATIC AGENTS (NO CHARGE) OPTIME
TOPICAL | Status: DC | PRN
  Administered 2020-03-27 (×2): 1 via TOPICAL

## 2020-03-27 MED ORDER — ATORVASTATIN CALCIUM 80 MG PO TABS
80.0000 mg | ORAL_TABLET | Freq: Every day | ORAL | Status: DC
  Administered 2020-03-28: 80 mg via ORAL
  Filled 2020-03-27: qty 1

## 2020-03-27 MED ORDER — ACETAMINOPHEN 325 MG PO TABS
325.0000 mg | ORAL_TABLET | ORAL | Status: DC | PRN
  Administered 2020-03-28: 650 mg via ORAL
  Filled 2020-03-27 (×2): qty 2

## 2020-03-27 MED ORDER — PHENYLEPHRINE HCL-NACL 10-0.9 MG/250ML-% IV SOLN
INTRAVENOUS | Status: DC | PRN
Start: 2020-03-27 — End: 2020-03-27
  Administered 2020-03-27: 30 ug/min via INTRAVENOUS

## 2020-03-27 MED ORDER — PHENYLEPHRINE 40 MCG/ML (10ML) SYRINGE FOR IV PUSH (FOR BLOOD PRESSURE SUPPORT)
PREFILLED_SYRINGE | INTRAVENOUS | Status: AC
  Filled 2020-03-27: qty 10

## 2020-03-27 MED ORDER — CARVEDILOL 3.125 MG PO TABS
3.1250 mg | ORAL_TABLET | Freq: Two times a day (BID) | ORAL | Status: DC
  Administered 2020-03-27 – 2020-03-28 (×2): 3.125 mg via ORAL
  Filled 2020-03-27 (×2): qty 1

## 2020-03-27 MED ORDER — ZOLPIDEM TARTRATE 5 MG PO TABS: 5.0000 mg | ORAL_TABLET | Freq: Every evening | ORAL | Status: DC | PRN

## 2020-03-27 MED ORDER — HYDROMORPHONE HCL 1 MG/ML IJ SOLN
0.2500 mg | INTRAMUSCULAR | Status: DC | PRN
  Administered 2020-03-27 (×2): 0.5 mg via INTRAVENOUS

## 2020-03-27 MED ORDER — BENAZEPRIL HCL 5 MG PO TABS
20.0000 mg | ORAL_TABLET | Freq: Every day | ORAL | Status: DC
  Administered 2020-03-27: 20 mg via ORAL
  Filled 2020-03-27: qty 4

## 2020-03-27 MED ORDER — MEPERIDINE HCL 25 MG/ML IJ SOLN: 6.2500 mg | INTRAMUSCULAR | Status: DC | PRN

## 2020-03-27 MED ORDER — CHLORHEXIDINE GLUCONATE 0.12 % MT SOLN
15.0000 mL | Freq: Once | OROMUCOSAL | Status: AC
  Administered 2020-03-27: 15 mL via OROMUCOSAL
  Filled 2020-03-27: qty 15

## 2020-03-27 MED ORDER — ONDANSETRON HCL 4 MG/2ML IJ SOLN
INTRAMUSCULAR | Status: DC | PRN
  Administered 2020-03-27: 4 mg via INTRAVENOUS

## 2020-03-27 SURGICAL SUPPLY — 51 items
ADH SKN CLS APL DERMABOND .7 (GAUZE/BANDAGES/DRESSINGS) ×1
ADPR TBG 2 MALE LL ART (MISCELLANEOUS)
BAG DECANTER FOR FLEXI CONT (MISCELLANEOUS) ×3 IMPLANT
CANISTER SUCT 3000ML PPV (MISCELLANEOUS) ×3 IMPLANT
CATH ROBINSON RED A/P 18FR (CATHETERS) ×3 IMPLANT
CLIP VESOCCLUDE MED 24/CT (CLIP) ×3 IMPLANT
CLIP VESOCCLUDE SM WIDE 24/CT (CLIP) ×3 IMPLANT
COVER PROBE W GEL 5X96 (DRAPES) ×3 IMPLANT
COVER WAND RF STERILE (DRAPES) ×3 IMPLANT
DERMABOND ADVANCED (GAUZE/BANDAGES/DRESSINGS) ×2
DERMABOND ADVANCED .7 DNX12 (GAUZE/BANDAGES/DRESSINGS) ×1 IMPLANT
DRAIN CHANNEL 15F RND FF W/TCR (WOUND CARE) IMPLANT
ELECT REM PT RETURN 9FT ADLT (ELECTROSURGICAL) ×3
ELECTRODE REM PT RTRN 9FT ADLT (ELECTROSURGICAL) ×1 IMPLANT
EVACUATOR SILICONE 100CC (DRAIN) IMPLANT
GAUZE 4X4 16PLY RFD (DISPOSABLE) ×2 IMPLANT
GLOVE BIO SURGEON STRL SZ7.5 (GLOVE) ×3 IMPLANT
GOWN STRL REUS W/ TWL LRG LVL3 (GOWN DISPOSABLE) ×2 IMPLANT
GOWN STRL REUS W/ TWL XL LVL3 (GOWN DISPOSABLE) ×1 IMPLANT
GOWN STRL REUS W/TWL LRG LVL3 (GOWN DISPOSABLE) ×6
GOWN STRL REUS W/TWL XL LVL3 (GOWN DISPOSABLE) ×3
HEMOSTAT SNOW SURGICEL 2X4 (HEMOSTASIS) ×4 IMPLANT
INSERT FOGARTY SM (MISCELLANEOUS) IMPLANT
IV ADAPTER SYR DOUBLE MALE LL (MISCELLANEOUS) IMPLANT
KIT BASIN OR (CUSTOM PROCEDURE TRAY) ×3 IMPLANT
KIT SHUNT ARGYLE CAROTID ART 6 (VASCULAR PRODUCTS) ×3 IMPLANT
KIT TURNOVER KIT B (KITS) ×3 IMPLANT
NDL HYPO 25GX1X1/2 BEV (NEEDLE) IMPLANT
NDL SPNL 20GX3.5 QUINCKE YW (NEEDLE) IMPLANT
NEEDLE HYPO 25GX1X1/2 BEV (NEEDLE) IMPLANT
NEEDLE SPNL 20GX3.5 QUINCKE YW (NEEDLE) IMPLANT
NS IRRIG 1000ML POUR BTL (IV SOLUTION) ×9 IMPLANT
PACK CAROTID (CUSTOM PROCEDURE TRAY) ×3 IMPLANT
PAD ARMBOARD 7.5X6 YLW CONV (MISCELLANEOUS) ×6 IMPLANT
PATCH VASC XENOSURE 1CMX6CM (Vascular Products) ×3 IMPLANT
PATCH VASC XENOSURE 1X6 (Vascular Products) IMPLANT
POSITIONER HEAD DONUT 9IN (MISCELLANEOUS) ×3 IMPLANT
STOPCOCK 4 WAY LG BORE MALE ST (IV SETS) IMPLANT
SUT ETHILON 3 0 PS 1 (SUTURE) IMPLANT
SUT MNCRL AB 4-0 PS2 18 (SUTURE) ×3 IMPLANT
SUT PROLENE 6 0 BV (SUTURE) ×7 IMPLANT
SUT SILK 3 0 (SUTURE) ×3
SUT SILK 3-0 18XBRD TIE 12 (SUTURE) IMPLANT
SUT VIC AB 2-0 CT1 27 (SUTURE) ×3
SUT VIC AB 2-0 CT1 TAPERPNT 27 (SUTURE) IMPLANT
SUT VIC AB 3-0 SH 27 (SUTURE) ×3
SUT VIC AB 3-0 SH 27X BRD (SUTURE) ×1 IMPLANT
SYR CONTROL 10ML LL (SYRINGE) IMPLANT
TOWEL GREEN STERILE (TOWEL DISPOSABLE) ×3 IMPLANT
TUBING ART PRESS 48 MALE/FEM (TUBING) IMPLANT
WATER STERILE IRR 1000ML POUR (IV SOLUTION) ×3 IMPLANT

## 2020-03-27 NOTE — Op Note (Signed)
Patient name: Rick Logan MRN: [id number] DOB: 1955-11-10 Sex: male  03/27/2020 Pre-operative Diagnosis: asymptomatic right carotid artery stenosis Post-operative diagnosis:  Same Surgeon:  Apolinar Junes C. Randie Heinz, MD Assistant: Nathanial Rancher, PA Procedure Performed:   Right carotid endarterectomy with bovine pericardial patch angioplasty and 12 French shunt neuro protection  Indications: 64 year old male with a history of asymptomatic carotid stenosis on the right that is high-grade.  By duplex was greater than 80% by CT scan was at least 70%.  We have discussed the risk benefits of medical management versus intervention and have decided on carotid endarterectomy.  Findings: Lesion was actually quite high the a sending pharyngeal artery as well as the posterior belly of the digastric were divided.  The vagus and hypoglossal glossal nerves were protected the ansa cervicalis was divided.  There was a tight calcified stenosis just past the bifurcation.  After endarterectomy we had smooth tapering distally.  After patch angioplasty we had good signal distally there was a high resistance waveform in the external carotid artery however.  Patient was neurologically intact upon awakening from anesthesia.  A PA was necessary as an Geophysicist/field seismologist for suctioning, retraction, exposure of the carotid artery.   Procedure:  The patient was identified in the holding area and taken to the operating room where he was placed supine on the operative table and general anesthesia was induced.  He was sterilely prepped and draped in the right neck and chest in the usual fashion antibiotics were administered a timeout was called.  We began with longitudinal incision along the anterior border the sternocleidomastoid.  We dissected down through the platysma down to the level of the sternocleidomastoid itself this was retracted laterally.  We divided multiple branches of the facial vein between ties.  We identified the common  carotid artery protected the vagus placed in umbilical tape around this patient was fully heparinized.  We dissected up onto the external carotid artery and placed a vessel loop around this.  It initially did not branch branches were after several centimeters these were not individually dissected out.  We identified our hypoglossal nerve.  We divided the digastric muscle for better exposure.  Hypogastric nerve was tethered by the a sending pharyngeal artery this was divided.  We then divided down to the internal carotid artery where this appeared normal placed a vessel loop around this.  A 12 French shunt and a bovine pericardial patch were prepared.  We then clamped the ICA followed by the common and external carotid arteries.  We opened the vessel longitudinally.  A 12 French shunt was placed distally allowed to backbleed placed proximally and flow was confirmed with Doppler.  We proceeded with endarterectomy which had smooth tapering distally no tacking was necessary.  We then sewed a bovine pericardial patch in place.  Prior to completion we removed our shunt.  We allowed backbleeding from the ICA was very good backbleeding.  We did have some backbleeding from the ECA very strong inflow.  We thoroughly irrigated the carotid bulb with heparinized saline.  We then completed our patch angioplasty suture line.  We released our external carotid followed by common carotid arteries and after several cardiac cycles are internal carotid artery.  Doppler demonstrated very good flow.  We did put a few repair sutures on the medial suture line near the external carotid artery.  We again confirmed with Doppler good flow.  50 mg of protamine was administered.  We obtained hemostasis meticulously irrigated the wound and closed  the platysma with 2-0 Vicryl followed by the skin with 4 Monocryl.  Dermabond is placed at the level of the skin.  He was awake from anesthesia having tolerated procedure well that immediate complication.   He was noted to be neurologically intact.  All counts were correct at completion.  He was transferred to the recovery room in stable condition.    EBL: 50cc  Aloura Matsuoka C. Randie Heinz, MD [facility] Office: [phone] Pager: [phone]

## 2020-03-27 NOTE — Transfer of Care (Signed)
Immediate Anesthesia Transfer of Care Note  Patient: Rick Logan  Procedure(s) Performed: RIGHT CAROTID ENDARTERECTOMY (Right Neck) PATCH ANGIOPLASTY OF RIGHT CAROTID ARTERY USING Heywood Iles PATCH (Right Neck)  Patient Location: PACU  Anesthesia Type:General  Level of Consciousness: awake, alert , oriented, patient cooperative and responds to stimulation  Airway & Oxygen Therapy: Patient Spontanous Breathing and Patient connected to nasal cannula oxygen  Post-op Assessment: Report given to RN, Post -op Vital signs reviewed and stable, Patient moving all extremities X 4 and Patient able to stick tongue midline  Post vital signs: Reviewed and stable  Last Vitals:  Vitals Value Taken Time  BP 164/90 03/27/20 0943  Temp    Pulse 53 03/27/20 0949  Resp 18 03/27/20 0949  SpO2 97 % 03/27/20 0949  Vitals shown include unvalidated device data.  Last Pain:  Vitals:   03/27/20 0615  TempSrc:   PainSc: 0-No pain         Complications: No complications documented.

## 2020-03-27 NOTE — Progress Notes (Signed)
Transferred pt to 4E19, receiving RN at bedside, VS stable, right neck incision clean/dry/intact, minimal swelling present-the same as admission to PACU, called and notified wife of patients location, she will visit in a few hrs.  Hermina Barters, RN

## 2020-03-27 NOTE — Anesthesia Procedure Notes (Signed)
Procedure Name: Intubation Date/Time: 03/27/2020 7:40 AM Performed by: Rosalio Macadamia, CRNA Pre-anesthesia Checklist: Patient identified, Emergency Drugs available and Suction available Patient Re-evaluated:Patient Re-evaluated prior to induction Oxygen Delivery Method: Circle System Utilized Preoxygenation: Pre-oxygenation with 100% oxygen Induction Type: IV induction Ventilation: Mask ventilation without difficulty Laryngoscope Size: Miller and 2 Grade View: Grade I Tube type: Oral Number of attempts: 1 Airway Equipment and Method: Stylet and Oral airway Placement Confirmation: ETT inserted through vocal cords under direct vision,  positive ETCO2 and breath sounds checked- equal and bilateral Secured at: 21 cm Tube secured with: Tape Dental Injury: Teeth and Oropharynx as per pre-operative assessment

## 2020-03-27 NOTE — Progress Notes (Signed)
Pt received from PACU. VSS. Neuro intact. R neck incision clean, dry and intact. Pt oriented to room and call light. Will continue to monitor.  Versie Starks, RN

## 2020-03-27 NOTE — Anesthesia Postprocedure Evaluation (Signed)
Anesthesia Post Note  Patient: Rick Logan  Procedure(s) Performed: RIGHT CAROTID ENDARTERECTOMY (Right Neck) PATCH ANGIOPLASTY OF RIGHT CAROTID ARTERY USING Livia Snellen VIOLOGIC PATCH (Right Neck)     Patient location during evaluation: PACU Anesthesia Type: General Level of consciousness: awake and alert Pain management: pain level controlled Vital Signs Assessment: post-procedure vital signs reviewed and stable Respiratory status: spontaneous breathing, nonlabored ventilation, respiratory function stable and patient connected to nasal cannula oxygen Cardiovascular status: blood pressure returned to baseline and stable Postop Assessment: no apparent nausea or vomiting Anesthetic complications: no   No complications documented.  Last Vitals:  Vitals:   03/27/20 1332 03/27/20 1430  BP: (!) 142/66 122/66  Pulse: 68 (!) 52  Resp: 19 17  Temp:    SpO2: 96% 92%    Last Pain:  Vitals:   03/27/20 1331  TempSrc: Oral  PainSc: 5                  Lelend Heinecke DAVID

## 2020-03-27 NOTE — Anesthesia Procedure Notes (Signed)
Arterial Line Insertion Start/End7/09/2019 7:10 AM, 03/27/2020 7:15 AM Performed by: Rosalio Macadamia, CRNA, CRNA  Patient location: Pre-op. Preanesthetic checklist: patient identified, IV checked, site marked, risks and benefits discussed, surgical consent, monitors and equipment checked, pre-op evaluation, timeout performed and anesthesia consent Lidocaine 1% used for infiltration radial was placed Catheter size: 20 Fr Hand hygiene performed  and maximum sterile barriers used   Attempts: 1 Procedure performed without using ultrasound guided technique. Following insertion, dressing applied. Post procedure assessment: normal and unchanged

## 2020-03-27 NOTE — Progress Notes (Signed)
  Progress Note    03/27/2020 3:49 PM Day of Surgery  Subjective:  Some soreness of throat and says he feels a little hoarse. Also has mild headache. He otherwise denies any trouble swallowing, speaking or facial numbness. He denies any difficulty breathing. He tolerated his lunch. Ambulated in room   Vitals:   03/27/20 1332 03/27/20 1430  BP: (!) 142/66 122/66  Pulse: 68 (!) 52  Resp: 19 17  Temp:    SpO2: 96% 92%   Physical Exam: Cardiac:  regular Lungs:  Non labored Incisions:  Right neck incision is clean, dry and intact. Mild ecchymosis of distal incision. Mild swelling along distal incision to level of sternal notch Extremities: moving all extremities without deficit, strength and motor 5/5 and symmetrical Neurologic: alert and oriented, CN intact  CBC    Component Value Date/Time   WBC 9.0 03/27/2020 1000   RBC 4.55 03/27/2020 1000   HGB 13.7 03/27/2020 1000   HGB 14.4 03/02/2019 1558   HCT 41.3 03/27/2020 1000   HCT 41.6 03/02/2019 1558   PLT 187 03/27/2020 1000   PLT 247 03/02/2019 1558   MCV 90.8 03/27/2020 1000   MCV 90 03/02/2019 1558   MCH 30.1 03/27/2020 1000   MCHC 33.2 03/27/2020 1000   RDW 12.5 03/27/2020 1000   RDW 12.8 03/02/2019 1558   LYMPHSABS 2.3 08/19/2015 1014   MONOABS 1.0 08/19/2015 1014   EOSABS 0.3 08/19/2015 1014   BASOSABS 0.0 08/19/2015 1014    BMET    Component Value Date/Time   NA 139 03/20/2020 1112   NA 138 03/02/2019 1558   K 4.3 03/20/2020 1112   CL 104 03/20/2020 1112   CO2 25 03/20/2020 1112   GLUCOSE 99 03/20/2020 1112   BUN 10 03/20/2020 1112   BUN 14 03/02/2019 1558   CREATININE 0.91 03/27/2020 1000   CREATININE 0.85 08/19/2015 1014   CALCIUM 9.5 03/20/2020 1112   GFRNONAA >60 03/27/2020 1000   GFRAA >60 03/27/2020 1000    INR    Component Value Date/Time   INR 1.1 03/20/2020 1112     Intake/Output Summary (Last 24 hours) at 03/27/2020 1549 Last data filed at 03/27/2020 1300 Gross per 24 hour  Intake  1110 ml  Output 50 ml  Net 1060 ml     Assessment/Plan:  64 y.o. male is s/p right Carotid Endarterectomy Day of Surgery. Neck incision is clean, dry and intact. Some mild swelling of distal incision into anterior neck which is not causing any difficulty in breathing. He is Neurologically intact. He is having some throat soreness and hoarseness. He is tolerating diet. He has ambulated in the room without difficulty. Pain is well controlled. If he does well overnight likely will be able to discharge tomorrow    Graceann Congress, [place] [facility] [phone] 03/27/2020 3:49 PM

## 2020-03-27 NOTE — H&P (Signed)
   History and Physical Update  The patient was interviewed and re-examined.  The patient's previous History and Physical has been reviewed and is unchanged from recent office visit. Plan for right CEA today. Risks and benefits reviewed including 2% risk of perioperative cva.  Israella Hubert C. Randie Heinz, MD [facility] [place] Office: [phone] Pager: [phone]   03/27/2020, 7:18 AM

## 2020-03-27 NOTE — Discharge Instructions (Signed)
° °  [facility]  ° °Discharge Instructions °  °Carotid Endarterectomy (CEA) ° °Please refer to the following instructions for your post-procedure care. Your surgeon or physician assistant will discuss any changes with you. ° °Activity ° °You are encouraged to walk as much as you can. You can slowly return to normal activities but must avoid strenuous activity and heavy lifting until your doctor tell you it's okay. Avoid activities such as vacuuming or swinging a golf club. You can drive after one week if you are comfortable and you are no longer taking prescription pain medications. It is normal to feel tired for serval weeks after your surgery. It is also normal to have difficulty with sleep habits, eating, and bowel movements after surgery. These will go away with time. ° °Bathing/Showering ° °Shower daily after you go home. Do not soak in a bathtub, hot tub, or swim until the incision heals completely. ° °Incision Care ° °Shower every day. Clean your incision with mild soap and water. Pat the area dry with a clean towel. You do not need a bandage unless otherwise instructed. Do not apply any ointments or creams to your incision. You may have skin glue on your incision. Do not peel it off. It will come off on its own in about one week. Your incision may feel thickened and raised for several weeks after your surgery. This is normal and the skin will soften over time.  ° °For Men Only: It's okay to shave around the incision but do not shave the incision itself for 2 weeks. It is common to have numbness under your chin that could last for several months. ° °Diet ° °Resume your normal diet. There are no special food restrictions following this procedure. A low fat/low cholesterol diet is recommended for all patients with vascular disease. In order to heal from your surgery, it is CRITICAL to get adequate nutrition. Your body requires vitamins, minerals, and protein. Vegetables are the  best source of vitamins and minerals. Vegetables also provide the perfect balance of protein. Processed food has little nutritional value, so try to avoid this. ° °Medications ° °Resume taking all of your medications unless your doctor or physician assistant tells you not to. If your incision is causing pain, you may take over-the- counter pain relievers such as acetaminophen (Tylenol). If you were prescribed a stronger pain medication, please be aware these medications can cause nausea and constipation. Prevent nausea by taking the medication with a snack or meal. Avoid constipation by drinking plenty of fluids and eating foods with a high amount of fiber, such as fruits, vegetables, and grains.  °Do not take Tylenol if you are taking prescription pain medications. ° °Follow Up ° °Our office will schedule a follow up appointment 2-3 weeks following discharge. ° °Please call us immediately for any of the following conditions ° °Increased pain, redness, drainage (pus) from your incision site. °Fever of 101 degrees or higher. °If you should develop stroke (slurred speech, difficulty swallowing, weakness on one side of your body, loss of vision) you should call 911 and go to the nearest emergency room. ° °Reduce your risk of vascular disease: ° °Stop smoking. If you would like help call [facility] at 1-800-QUIT-NOW (1-800-784-8669) or [place] at 336-586-4000. °Manage your cholesterol °Maintain a desired weight °Control your diabetes °Keep your blood pressure down ° °If you have any questions, please call the office at [phone]. ° °

## 2020-03-28 ENCOUNTER — Encounter (HOSPITAL_COMMUNITY): Payer: Self-pay | Admitting: Vascular Surgery

## 2020-03-28 LAB — BASIC METABOLIC PANEL
Anion gap: 9 (ref 5–15)
BUN: 12 mg/dL (ref 8–23)
CO2: 23 mmol/L (ref 22–32)
Calcium: 8 mg/dL — ABNORMAL LOW (ref 8.9–10.3)
Chloride: 105 mmol/L (ref 98–111)
Creatinine, Ser: 1.06 mg/dL (ref 0.61–1.24)
GFR calc Af Amer: >60 mL/min (ref >60)
GFR calc non Af Amer: >60 mL/min (ref >60)
Glucose, Bld: 145 mg/dL — ABNORMAL HIGH (ref 70–99)
Potassium: 4 mmol/L (ref 3.5–5.1)
Sodium: 137 mmol/L (ref 135–145)

## 2020-03-28 LAB — CBC
HCT: 39.1 % (ref 39.0–52.0)
Hemoglobin: 12.9 g/dL — ABNORMAL LOW (ref 13.0–17.0)
MCH: 30.1 pg (ref 26.0–34.0)
MCHC: 33 g/dL (ref 30.0–36.0)
MCV: 91.1 fL (ref 80.0–100.0)
Platelets: 215 10*3/uL (ref 150–400)
RBC: 4.29 MIL/uL (ref 4.22–5.81)
RDW: 12.6 % (ref 11.5–15.5)
WBC: 11.7 10*3/uL — ABNORMAL HIGH (ref 4.0–10.5)
nRBC: 0 % (ref 0.0–0.2)

## 2020-03-28 NOTE — Plan of Care (Signed)
  Problem: Clinical Measurements: Goal: Will remain free from infection Outcome: Progressing   Problem: Activity: Goal: Risk for activity intolerance will decrease Outcome: Progressing   

## 2020-03-28 NOTE — Progress Notes (Signed)
Patient walked approximately 1500 ft with no issues. Patient tolerated it well. No assistance needed.

## 2020-03-28 NOTE — Discharge Summary (Signed)
Carotid Discharge Summary     Rick Logan [birth year] 64 y.o. male  [id number]  Admission Date: 03/27/2020  Discharge Date: 03/28/2020  Physician: Maeola Harman, MD  Admission Diagnosis: Carotid stenosis, asymptomatic, right [facility]  Hospital Course:  The patient was admitted to the hospital and taken to the operating room on 03/27/2020 and underwent right carotid endarterectomy for asymptomatic right carotid stensis.  The pt tolerated the procedure well and was transported to the PACU in excellent condition. He remained stable and was transferred to the floor  By POD 1, the pt neuro status remained intact.  Neck incision intact with mild swelling. Had some soreness in his throat post operatively but this improved overnight but improved. He also had a mild headache post operatively which also was resolved. He had minimal operative site pain. Tolerated diet and ambulated without difficulty.  The remainder of the hospital course consisted of increasing mobilization and increasing intake of solids without difficulty.  He remained stable for discharge home. He will go home on aspirin and Statin. He does not tolerate opioid mediations so he requested no pain medication prescription to go home with. He will take extra strength tylenol for pain. He will follow up with Dr. Randie Heinz in the office in 2-3 weeks  Recent Labs    03/28/20 0352  NA 137  K 4.0  CL 105  CO2 23  GLUCOSE 145*  BUN 12  CALCIUM 8.0*   Recent Labs    03/27/20 1000 03/28/20 0352  WBC 9.0 11.7*  HGB 13.7 12.9*  HCT 41.3 39.1  PLT 187 215   No results for input(s): INR in the last 72 hours.   Discharge Instructions    Discharge patient   Complete by: As directed    Discharge disposition: 01-Home or Self Care   Discharge patient date: 03/28/2020      Discharge Diagnosis:  Carotid stenosis, asymptomatic, right [I65.21]  Secondary Diagnosis: Patient Active Problem List   Diagnosis Date  Noted  . Carotid stenosis, asymptomatic, right 03/27/2020  . PAD (peripheral artery disease) (HCC) 01/10/2020  . Right-sided extracranial carotid artery stenosis 12/06/2017  . Systolic heart failure (HCC) 07/27/2017  . Non-ST elevation (NSTEMI) myocardial infarction (HCC) 06/03/2017  . Aspiration pneumonia (HCC)   . Syncope 06/01/2017  . Ventricular fibrillation (HCC) 06/01/2017  . Hyperlipidemia 06/01/2017  . Tobacco use 06/01/2017  . Cardiac arrest (HCC) 06/01/2017  . PVD (peripheral vascular disease) (HCC) 08/29/2015  . Essential hypertension 07/30/2015  . Claudication (HCC) 07/30/2015   Past Medical History:  Diagnosis Date  . AICD (automatic cardioverter/defibrillator) present   . Arthritis    "joints get sore at times" (08/28/2015)  . Cardiac arrest (HCC)    Vfib   . Carotid artery disease (HCC)   . Claudication of calf muscles (HCC)    left lower extremity  . Essential hypertension   . GERD (gastroesophageal reflux disease)   . Hyperlipidemia   . Hypertension   . Myocardial infarction (HCC)   . PVD (peripheral vascular disease) (HCC)   . Smoking   . Systolic heart failure (HCC)    EF 30-35%  . Tobacco abuse     Allergies as of 03/28/2020   No Known Allergies     Medication List    TAKE these medications   aspirin EC 81 MG tablet Take 81 mg by mouth daily.   atorvastatin 80 MG tablet Commonly known as: LIPITOR Take 1 tablet (80 mg total) by mouth daily.   benazepril  40 MG tablet Commonly known as: LOTENSIN Take 0.5 tablets (20 mg total) by mouth at bedtime. Please make overdue appt with Dr. Katrinka Blazing before anymore refills. 2nd attempt   carvedilol 3.125 MG tablet Commonly known as: COREG TAKE 1 TABLET BY MOUTH TWICE A DAY WITH MEALS What changed:   how much to take  how to take this  when to take this  additional instructions   Coenzyme Q10 200 MG capsule Take 1 capsule (200 mg total) by mouth daily.   Tylenol 8 Hour Arthritis Pain 650 MG CR  tablet Generic drug: acetaminophen Take 650 mg by mouth every 8 (eight) hours as needed for pain.        Discharge Instructions:   [facility] Discharge Instructions Carotid Endarterectomy (CEA)  Please refer to the following instructions for your post-procedure care. Your surgeon or physician assistant will discuss any changes with you.  Activity  You are encouraged to walk as much as you can. You can slowly return to normal activities but must avoid strenuous activity and heavy lifting until your doctor tell you it's OK. Avoid activities such as vacuuming or swinging a golf club. You can drive after one week if you are comfortable and you are no longer taking prescription pain medications. It is normal to feel tired for serval weeks after your surgery. It is also normal to have difficulty with sleep habits, eating, and bowel movements after surgery. These will go away with time.  Bathing/Showering  You may shower after you come home. Do not soak in a bathtub, hot tub, or swim until the incision heals completely.  Incision Care  Shower every day. Clean your incision with mild soap and water. Pat the area dry with a clean towel. You do not need a bandage unless otherwise instructed. Do not apply any ointments or creams to your incision. You may have skin glue on your incision. Do not peel it off. It will come off on its own in about one week. Your incision may feel thickened and raised for several weeks after your surgery. This is normal and the skin will soften over time. For Men Only: It's OK to shave around the incision but do not shave the incision itself for 2 weeks. It is common to have numbness under your chin that could last for several months.  Diet  Resume your normal diet. There are no special food restrictions following this procedure. A low fat/low cholesterol diet is recommended for all patients with vascular disease. In order to heal from  your surgery, it is CRITICAL to get adequate nutrition. Your body requires vitamins, minerals, and protein. Vegetables are the best source of vitamins and minerals. Vegetables also provide the perfect balance of protein. Processed food has little nutritional value, so try to avoid this.  Medications  Resume taking all of your medications unless your doctor or physician assistant tells you not to.  If your incision is causing pain, you may take over-the- counter pain relievers such as acetaminophen (Tylenol). If you were prescribed a stronger pain medication, please be aware these medications can cause nausea and constipation.  Prevent nausea by taking the medication with a snack or meal. Avoid constipation by drinking plenty of fluids and eating foods with a high amount of fiber, such as fruits, vegetables, and grains. Do not take Tylenol if you are taking prescription pain medications.  Follow Up  Our office will schedule a follow up appointment 2-3 weeks following discharge.  Please call us immediately for any of the following conditions  . Increased pain, redness, drainage (pus) from your incision site. . Fever of 101 degrees or higher. . If you should develop stroke (slurred speech, difficulty swallowing, weakness on one side of your body, loss of vision) you should call 911 and go to the nearest emergency room. .  Reduce your risk of vascular disease:  . Stop smoking. If you would like help call [facility] at 1-800-QUIT-NOW (325-244-6232) or [place] at 604-577-9054. . Manage your cholesterol . Maintain a desired weight . Control your diabetes . Keep your blood pressure down .  If you have any questions, please call the office at [phone].  Prescriptions given: None  Disposition: Home  Patient's condition: is Excellent  Follow up: 1. Dr. Randie Heinz in 2- 3 weeks.   Jnae Thomaston PA-C [facility] [phone]   --- For [facility] Registry use ---     Modified Rankin score at D/C (0-6): 0  IV medication needed for:  1. Hypertension: No 2. Hypotension: No  Post-op Complications: No  1. Post-op CVA or TIA: No  If yes: Event classification (right eye, left eye, right cortical, left cortical, verterobasilar, other): N/a  If yes: Timing of event (intra-op, <6 hrs post-op, >=6 hrs post-op, unknown): n/a  2. CN injury: No  If yes: CN n/a injuried   3. Myocardial infarction: No  If yes: Dx by (EKG or clinical, Troponin): n/a  4.  CHF: No  5.  Dysrhythmia (new): No  6. Wound infection: No  7. Reperfusion symptoms: No  8. Return to OR: No  If yes: return to OR for (bleeding, neurologic, other CEA incision, other): n/a  Discharge medications: Statin use:  Yes ASA use:  Yes   Beta blocker use:  Yes ACE-Inhibitor use:  Yes  ARB use:  No CCB use: No P2Y12 Antagonist use: No, [ ]  Plavix, [ ]  Plasugrel, [ ]  Ticlopinine, [ ]  Ticagrelor, [ ]  Other, [ ]  No for medical reason, [ ]  Non-compliant, [ ]  Not-indicated Anti-coagulant use:  No, [ ]  Warfarin, [ ]  Rivaroxaban, [ ]  Dabigatran,

## 2020-03-28 NOTE — Progress Notes (Addendum)
  Progress Note    03/28/2020 7:41 AM 1 Day Post-Op  Subjective: throat soreness improved as well as hoarseness. Headache resolved. Pain minimal. Has ambulated in hall. Tolerating diet. Denies any trouble swallowing, speaking or facial numbness   Vitals:   03/27/20 2335 03/28/20 0338  BP: 128/76 137/80  Pulse: (!) 57 65  Resp: 17 15  Temp: 97.7 F (36.5 C) 97.9 F (36.6 C)  SpO2: 95% 95%   Physical Exam: Cardiac:  regular Lungs:  Non labored Incisions: right neck incision clean, dry and intact. Some swelling of right side of neck. Small hematoma but stable Extremities: moving all extremities without deficits Neurologic: alert and oriented. CN intact  CBC    Component Value Date/Time   WBC 11.7 (H) 03/28/2020 0352   RBC 4.29 03/28/2020 0352   HGB 12.9 (L) 03/28/2020 0352   HGB 14.4 03/02/2019 1558   HCT 39.1 03/28/2020 0352   HCT 41.6 03/02/2019 1558   PLT 215 03/28/2020 0352   PLT 247 03/02/2019 1558   MCV 91.1 03/28/2020 0352   MCV 90 03/02/2019 1558   MCH 30.1 03/28/2020 0352   MCHC 33.0 03/28/2020 0352   RDW 12.6 03/28/2020 0352   RDW 12.8 03/02/2019 1558   LYMPHSABS 2.3 08/19/2015 1014   MONOABS 1.0 08/19/2015 1014   EOSABS 0.3 08/19/2015 1014   BASOSABS 0.0 08/19/2015 1014    BMET    Component Value Date/Time   NA 137 03/28/2020 0352   NA 138 03/02/2019 1558   K 4.0 03/28/2020 0352   CL 105 03/28/2020 0352   CO2 23 03/28/2020 0352   GLUCOSE 145 (H) 03/28/2020 0352   BUN 12 03/28/2020 0352   BUN 14 03/02/2019 1558   CREATININE 1.06 03/28/2020 0352   CREATININE 0.85 08/19/2015 1014   CALCIUM 8.0 (L) 03/28/2020 0352   GFRNONAA >60 03/28/2020 0352   GFRAA >60 03/28/2020 0352    INR    Component Value Date/Time   INR 1.1 03/20/2020 1112     Intake/Output Summary (Last 24 hours) at 03/28/2020 0741 Last data filed at 03/28/2020 0443 Gross per 24 hour  Intake 2063.25 ml  Output 675 ml  Net 1388.25 ml     Assessment/Plan:  64 y.o. male is s/p  right carotid endartectomy 1 Day Post-Op. Neck incision clean, dry and intact. Mild swelling of right neck stable. Neurologically intact. Pain well controlled. He is stable for discharge home today. He will continue his Aspirin and Statin. He will follow up with Dr. Randie Heinz in the office in 2-3 weeks    Graceann Congress, [place] [facility] [phone] 03/28/2020 7:41 AM    I have interviewed and examined patient with PA and agree with assessment and plan above.   Artavious Trebilcock C. Randie Heinz, MD [facility] [place] Office: [phone] Pager: [phone]

## 2020-04-11 ENCOUNTER — Other Ambulatory Visit: Payer: Self-pay

## 2020-04-11 ENCOUNTER — Ambulatory Visit (INDEPENDENT_AMBULATORY_CARE_PROVIDER_SITE_OTHER): Payer: Self-pay | Admitting: Physician Assistant

## 2020-04-11 VITALS — BP 114/69 | HR 68 | Temp 97.6°F | Resp 20 | Ht 71.0 in | Wt 200.5 lb

## 2020-04-11 DIAGNOSIS — I6521 Occlusion and stenosis of right carotid artery: Secondary | ICD-10-CM

## 2020-04-11 NOTE — Progress Notes (Signed)
  POST OPERATIVE OFFICE NOTE    CC:  F/u for surgery  HPI:  This is a 64 y.o. male who is s/p right carotid endarterectomy for asymptomatic right carotid stenosis by Dr. Randie Heinz on March 27, 2020.  He is complaining of right mandibular angle pain when chewing and opening his mouth.  No symptoms of extremity weakness, monocular blindness or slurred speech.  He is compliant with statin and aspirin daily.  He is maintained on beta-blocker and ACE inhibitor for hypertension.  Previous carotid duplex shows left carotid stenosis of approximately 1 to 39%. He recently underwent left femoral to above-knee popliteal artery bypass with PTFE graft on January 10, 2020.  He has no complaints of lower extremity pain with exercise or rest pain. No Known Allergies  Current Outpatient Medications  Medication Sig Dispense Refill  . acetaminophen (TYLENOL 8 HOUR ARTHRITIS PAIN) 650 MG CR tablet Take 650 mg by mouth every 8 (eight) hours as needed for pain.     [name] aspirin EC 81 MG tablet Take 81 mg by mouth daily.    [name] atorvastatin (LIPITOR) 80 MG tablet Take 1 tablet (80 mg total) by mouth daily. 90 tablet 3  . benazepril (LOTENSIN) 40 MG tablet Take 0.5 tablets (20 mg total) by mouth at bedtime. Please make overdue appt with Dr. Katrinka Blazing before anymore refills. 2nd attempt 7 tablet 0  . carvedilol (COREG) 3.125 MG tablet TAKE 1 TABLET BY MOUTH TWICE A DAY WITH MEALS (Patient taking differently: Take 3.125 mg by mouth 2 (two) times daily with a meal. ) 180 tablet 3  . Coenzyme Q10 200 MG capsule Take 1 capsule (200 mg total) by mouth daily. 90 capsule 3   No current facility-administered medications for this visit.     ROS:  See HPI  Vitals:   04/11/20 1055  BP: 114/69  Pulse: 68  Resp: 20  Temp: 97.6 F (36.4 C)  SpO2: 96%    Physical Exam:  General appearance: Patient is a well-developed, well-nourished male in no apparent distress Cardiac: Heart rate and rhythm are regular Respiratory: Clear to  auscultation bilaterally Incision: Right neck incision inspected.  Mild fullness of the incision.  Well approximated with no signs of infection. No drainage. Extremities: Upper and lower extremity motor function normal.  5 out of 5 hand grip strength.  Both feet are warm and well-perfused Neuro: Alert and oriented x4.  Lorna Few shows right lower lip drooping   Assessment/Plan:  This is a 64 y.o. male who is s/p: right CEA. Right marginal nerve palsy otherwise neurologically intact. Reviewed signs/sx of stroke/TIA.  He needs lower extremity follow-up in approximately one month.  He tells me he is relocating to [place], [place] in the next several months.  I will have him return for LLE duplex and ABIs next month and to see Dr. Randie Heinz to discuss plans for follow-up going forward.  Wendi Maya, PA-C [facility] [phone]  Clinic MD:  Randie Heinz

## 2020-04-16 ENCOUNTER — Other Ambulatory Visit: Payer: Self-pay | Admitting: *Deleted

## 2020-04-16 DIAGNOSIS — I739 Peripheral vascular disease, unspecified: Secondary | ICD-10-CM

## 2020-05-16 ENCOUNTER — Other Ambulatory Visit: Payer: Self-pay

## 2020-05-16 ENCOUNTER — Ambulatory Visit (HOSPITAL_COMMUNITY)
Admission: RE | Admit: 2020-05-16 | Discharge: 2020-05-16 | Disposition: A | Discharge status: Home / Self Care | Payer: Medicare Other | Source: Ambulatory Visit | Attending: Vascular Surgery | Admitting: Vascular Surgery

## 2020-05-16 ENCOUNTER — Encounter: Payer: Self-pay | Admitting: Vascular Surgery

## 2020-05-16 ENCOUNTER — Ambulatory Visit (INDEPENDENT_AMBULATORY_CARE_PROVIDER_SITE_OTHER): Payer: Medicare Other | Admitting: Vascular Surgery

## 2020-05-16 ENCOUNTER — Ambulatory Visit (INDEPENDENT_AMBULATORY_CARE_PROVIDER_SITE_OTHER)
Admission: RE | Admit: 2020-05-16 | Discharge: 2020-05-16 | Disposition: A | Discharge status: Home / Self Care | Payer: Medicare Other | Source: Ambulatory Visit | Attending: Vascular Surgery | Admitting: Vascular Surgery

## 2020-05-16 VITALS — BP 125/75 | HR 69 | Temp 98.2°F | Resp 20 | Ht 71.0 in | Wt 198.0 lb

## 2020-05-16 DIAGNOSIS — I739 Peripheral vascular disease, unspecified: Secondary | ICD-10-CM | POA: Insufficient documentation

## 2020-05-16 NOTE — Progress Notes (Signed)
    Subjective:     Patient ID: Rick Logan, male   DOB: 21-Dec-1955, 64 y.o.   MRN: [id number]  HPI 64 year old male follows up after right carotid endarterectomy.  He does have some pain with chewing but otherwise okay.  No stroke TIA amaurosis.  Also has previous left lower extremity bypass from which she is well-healed has no symptoms.   Review of Systems Right jaw pain with chewing    Objective:   Physical Exam Vitals:   05/16/20 1502  BP: 125/75  Pulse: 69  Resp: 20  Temp: 98.2 F (36.8 C)  SpO2: 96%   Awake alert oriented Right neck incision healing well Neurologically intact Probable pedal pulses bilaterally  I have independently interpreted his left lower extremity graft duplex which demonstrates 50 to 70% stenosis at the proximal anastomosis with no down stream decreased velocities.  ABI on the left 0.95    Assessment/plan     64 year old male status post left femoropopliteal bypass and now right carotid endarterectomy healing well.  He does have plans to move to [place].  If he stays we will have him follow-up in 6 months and we will order carotid duplex and left lower extremity duplex and ABIs.  If he moves we can send his records upon request.       Luanna Salk. Randie Heinz, MD [facility] [place] Office: [phone] Pager: [phone]

## 2020-05-20 ENCOUNTER — Other Ambulatory Visit: Payer: Self-pay | Admitting: *Deleted

## 2020-05-20 DIAGNOSIS — I6521 Occlusion and stenosis of right carotid artery: Secondary | ICD-10-CM

## 2020-05-20 DIAGNOSIS — I739 Peripheral vascular disease, unspecified: Secondary | ICD-10-CM

## 2020-07-04 ENCOUNTER — Ambulatory Visit (INDEPENDENT_AMBULATORY_CARE_PROVIDER_SITE_OTHER): Payer: Medicare Other

## 2020-07-04 DIAGNOSIS — I4901 Ventricular fibrillation: Secondary | ICD-10-CM | POA: Diagnosis not present

## 2020-07-05 LAB — CUP PACEART REMOTE DEVICE CHECK
Battery Remaining Longevity: 115 mo
Battery Voltage: 3 V
Brady Statistic RV Percent Paced: 0.02 %
Date Time Interrogation Session: 20211008125838
HighPow Impedance: 88 Ohm
Implantable Lead Implant Date: 20180910
Implantable Lead Location: 753860
Implantable Pulse Generator Implant Date: 20180910
Lead Channel Impedance Value: 532 Ohm
Lead Channel Impedance Value: 589 Ohm
Lead Channel Pacing Threshold Amplitude: 0.625 V
Lead Channel Pacing Threshold Pulse Width: 0.4 ms
Lead Channel Sensing Intrinsic Amplitude: 4.875 mV
Lead Channel Sensing Intrinsic Amplitude: 4.875 mV
Lead Channel Setting Pacing Amplitude: 2.5 V
Lead Channel Setting Pacing Pulse Width: 0.4 ms
Lead Channel Setting Sensing Sensitivity: 0.3 mV

## 2020-07-08 NOTE — Progress Notes (Signed)
Remote ICD transmission.   

## 2020-07-26 DIAGNOSIS — Z23 Encounter for immunization: Secondary | ICD-10-CM | POA: Diagnosis not present

## 2020-08-09 ENCOUNTER — Other Ambulatory Visit: Payer: Self-pay | Admitting: Cardiovascular Disease

## 2020-10-03 ENCOUNTER — Ambulatory Visit (INDEPENDENT_AMBULATORY_CARE_PROVIDER_SITE_OTHER): Payer: Medicare Other

## 2020-10-03 DIAGNOSIS — I4901 Ventricular fibrillation: Secondary | ICD-10-CM | POA: Diagnosis not present

## 2020-10-03 LAB — CUP PACEART REMOTE DEVICE CHECK
Battery Remaining Longevity: 110 mo
Battery Voltage: 3 V
Brady Statistic RV Percent Paced: 0.01 %
Date Time Interrogation Session: 20220107043726
HighPow Impedance: 74 Ohm
Implantable Lead Implant Date: 20180910
Implantable Lead Location: 753860
Implantable Pulse Generator Implant Date: 20180910
Lead Channel Impedance Value: 456 Ohm
Lead Channel Impedance Value: 532 Ohm
Lead Channel Pacing Threshold Amplitude: 0.625 V
Lead Channel Pacing Threshold Pulse Width: 0.4 ms
Lead Channel Sensing Intrinsic Amplitude: 3.75 mV
Lead Channel Sensing Intrinsic Amplitude: 3.75 mV
Lead Channel Setting Pacing Amplitude: 2.5 V
Lead Channel Setting Pacing Pulse Width: 0.4 ms
Lead Channel Setting Sensing Sensitivity: 0.3 mV

## 2020-10-17 NOTE — Progress Notes (Signed)
Remote ICD transmission.   

## 2020-12-02 IMAGING — CT CT ANGIO HEAD
2 of 11 series · 4 of 33 positions shown · IV contrast (iopamidol)
Comparison: None.

CLINICAL DATA: Abnormal carotid ultrasound. Carotid artery stenosis

EXAM:
CT ANGIOGRAPHY HEAD AND NECK
TECHNIQUE: Multidetector CT imaging of the head and neck was performed using
the standard protocol during bolus administration of intravenous
contrast. Multiplanar CT image reconstructions and MIPs were
obtained to evaluate the vascular anatomy. Carotid stenosis
measurements (when applicable) are obtained utilizing NASCET
criteria, using the distal internal carotid diameter as the
denominator.
Creatinine was obtained on site at [HOSPITAL] at [REDACTED].
Results: Creatinine 0.9 mg/dL.
CONTRAST:  75mL FSPWWQ-OZX IOPAMIDOL (FSPWWQ-OZX) INJECTION 76%

[Series 11: brain 3.00 hr40 s3 sag without ibhc · sagittal · non-contrast · 0.31mm/px · 1 of 57 slices shown]
[im 29/57  soft-tissue]
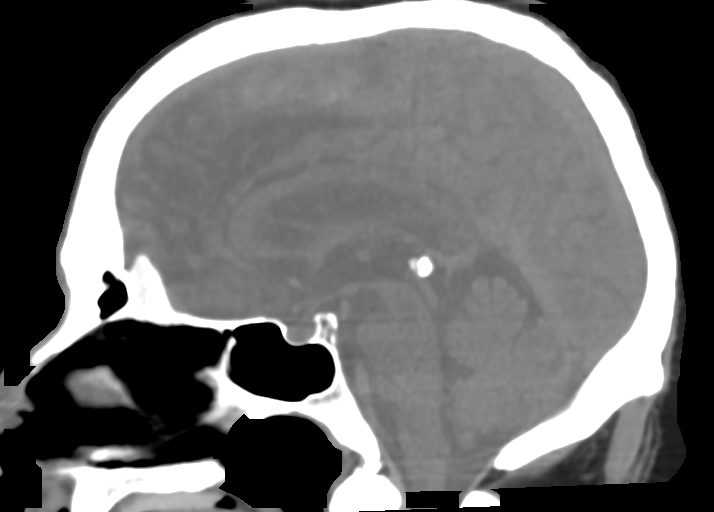

[Series 14: cta head & neck 1.00 hv48 s3 ax thin mips · axial · 0.52mm/px · z∈[-838,-456]mm · 3 of 383 slices shown]
[im 1/383  soft-tissue]
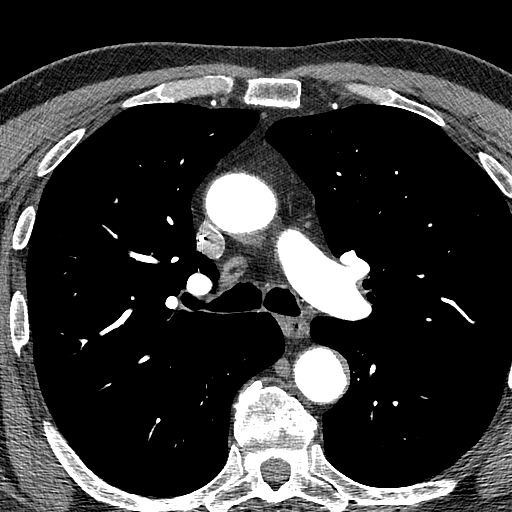
[im 192/383  bone]
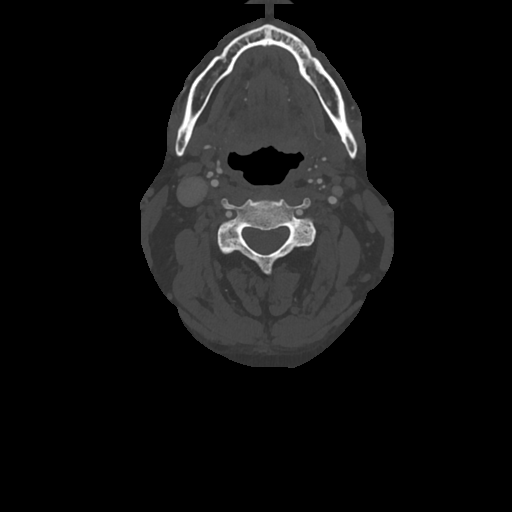
[im 383/383  soft-tissue]
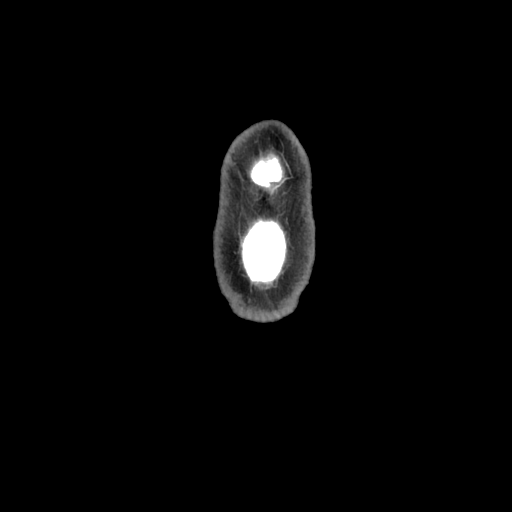

[4 of 33 positions shown; findings below may reference images not displayed]

FINDINGS: CT HEAD FINDINGS

Brain: No evidence of acute infarction, hemorrhage, hydrocephalus,
extra-axial collection or mass lesion/mass effect. Remote lacunar
infarct at the right caudate head. Brain volume is normal

Vascular: See below

Skull: Negative

Sinuses: Negative

Orbits: Negative

Review of the MIP images confirms the above findings

CTA NECK FINDINGS

Aortic arch: Atheromatous plaque. Three vessel branching. Negative
for aneurysm.

Right carotid system: Diffuse atheromatous wall thickening of the
common carotid. Mixed density plaque at the ICA bulb with 70%
stenosis as measured on sagittal reformats. No ulceration or
beading. The bifurcation is below the mandibular angle.

Left carotid system: Atheromatous wall thickening of the common
carotid. Mixed density plaque at the bulb without stenosis or
ulceration. Negative for beading.

Vertebral arteries: No proximal subclavian stenosis. The vertebral
arteries are smooth and widely patent on both sides.

Skeleton: Ordinary degenerative changes.

Other neck: No notable incidental finding.

Upper chest: Pacer lead from the left.

Review of the MIP images confirms the above findings

CTA HEAD FINDINGS

Anterior circulation: Mild calcified plaque along the carotid
siphons. No branch occlusion, beading, or aneurysm. No flow limiting
stenosis

Posterior circulation: The vertebral and basilar arteries are smooth
and widely patent. Mild atheromatous change to the bilateral PCA. No
branch occlusion, beading, or aneurysm.

Venous sinuses: Patent on both sides. Arachnoid granulations are
noted.

Anatomic variants: None significant

Review of the MIP images confirms the above findings
IMPRESSION: 1. ~70% atheromatous narrowing at the right ICA bulb.
2. No flow limiting stenosis in the left carotid or vertebral
circulation.

## 2021-01-02 ENCOUNTER — Ambulatory Visit (INDEPENDENT_AMBULATORY_CARE_PROVIDER_SITE_OTHER): Payer: Medicare Other

## 2021-01-02 DIAGNOSIS — I469 Cardiac arrest, cause unspecified: Secondary | ICD-10-CM | POA: Diagnosis not present

## 2021-01-02 LAB — CUP PACEART REMOTE DEVICE CHECK
Battery Remaining Longevity: 105 mo
Battery Voltage: 3 V
Brady Statistic RV Percent Paced: 0.01 %
Date Time Interrogation Session: 20220408033624
HighPow Impedance: 81 Ohm
Implantable Lead Implant Date: 20180910
Implantable Lead Location: 753860
Implantable Pulse Generator Implant Date: 20180910
Lead Channel Impedance Value: 475 Ohm
Lead Channel Impedance Value: 589 Ohm
Lead Channel Pacing Threshold Amplitude: 0.625 V
Lead Channel Pacing Threshold Pulse Width: 0.4 ms
Lead Channel Sensing Intrinsic Amplitude: 5.25 mV
Lead Channel Sensing Intrinsic Amplitude: 5.25 mV
Lead Channel Setting Pacing Amplitude: 2.5 V
Lead Channel Setting Pacing Pulse Width: 0.4 ms
Lead Channel Setting Sensing Sensitivity: 0.3 mV

## 2021-01-08 ENCOUNTER — Telehealth: Payer: Self-pay

## 2021-01-08 NOTE — Telephone Encounter (Signed)
Patient called in and wanted to be released to [place] [facility] as he lives there now. Patient has been released

## 2021-01-15 NOTE — Progress Notes (Signed)
Remote ICD transmission.   

## 2021-01-31 ENCOUNTER — Other Ambulatory Visit: Payer: Self-pay | Admitting: Cardiology

## 2021-07-29 ENCOUNTER — Other Ambulatory Visit: Payer: Self-pay | Admitting: Cardiovascular Disease

## 2021-10-27 ENCOUNTER — Other Ambulatory Visit: Payer: Self-pay | Admitting: Internal Medicine

## 2021-10-31 ENCOUNTER — Other Ambulatory Visit: Payer: Self-pay | Admitting: Internal Medicine

## 2021-11-07 ENCOUNTER — Other Ambulatory Visit: Payer: Self-pay | Admitting: Internal Medicine

## 2021-11-22 ENCOUNTER — Other Ambulatory Visit: Payer: Self-pay | Admitting: Internal Medicine

## 2021-12-02 ENCOUNTER — Other Ambulatory Visit: Payer: Self-pay | Admitting: Internal Medicine
